# Patient Record
Sex: Female | Born: 1975
Health system: Southern US, Community
[De-identification: ages and names within clinical notes are randomized; demographics above are authoritative.]

## PROBLEM LIST (undated history)

## (undated) DIAGNOSIS — G43909 Migraine, unspecified, not intractable, without status migrainosus: Secondary | ICD-10-CM

## (undated) DIAGNOSIS — Z8659 Personal history of other mental and behavioral disorders: Secondary | ICD-10-CM

## (undated) DIAGNOSIS — F329 Major depressive disorder, single episode, unspecified: Secondary | ICD-10-CM

## (undated) DIAGNOSIS — F419 Anxiety disorder, unspecified: Secondary | ICD-10-CM

## (undated) DIAGNOSIS — F32A Depression, unspecified: Secondary | ICD-10-CM

## (undated) DIAGNOSIS — Z87442 Personal history of urinary calculi: Secondary | ICD-10-CM

## (undated) DIAGNOSIS — R112 Nausea with vomiting, unspecified: Secondary | ICD-10-CM

## (undated) DIAGNOSIS — Z9889 Other specified postprocedural states: Secondary | ICD-10-CM

## (undated) DIAGNOSIS — Z87898 Personal history of other specified conditions: Secondary | ICD-10-CM

## (undated) HISTORY — PX: ORIF RADIUS & ULNA FRACTURES: SHX2129

## (undated) HISTORY — PX: OTHER SURGICAL HISTORY: SHX169

## (undated) HISTORY — DX: Depression, unspecified: F32.A

## (undated) HISTORY — DX: Migraine, unspecified, not intractable, without status migrainosus: G43.909

## (undated) HISTORY — DX: Major depressive disorder, single episode, unspecified: F32.9

## (undated) HISTORY — PX: ELBOW SURGERY: SHX618

---

## 1999-12-09 ENCOUNTER — Other Ambulatory Visit: Admission: RE | Admit: 1999-12-09 | Discharge: 1999-12-09 | Payer: Self-pay | Admitting: Family Medicine

## 2002-12-23 ENCOUNTER — Encounter: Payer: Self-pay | Admitting: Cardiology

## 2003-06-13 ENCOUNTER — Emergency Department (HOSPITAL_COMMUNITY): Admission: EM | Admit: 2003-06-13 | Discharge: 2003-06-13 | Payer: Self-pay | Admitting: Emergency Medicine

## 2003-08-24 ENCOUNTER — Emergency Department (HOSPITAL_COMMUNITY): Admission: EM | Admit: 2003-08-24 | Discharge: 2003-08-24 | Payer: Self-pay | Admitting: Emergency Medicine

## 2004-10-15 ENCOUNTER — Emergency Department (HOSPITAL_COMMUNITY): Admission: EM | Admit: 2004-10-15 | Discharge: 2004-10-15 | Payer: Self-pay | Admitting: Family Medicine

## 2005-01-29 ENCOUNTER — Ambulatory Visit (HOSPITAL_COMMUNITY): Admission: RE | Admit: 2005-01-29 | Discharge: 2005-01-29 | Payer: Self-pay | Admitting: Obstetrics & Gynecology

## 2005-01-29 ENCOUNTER — Encounter: Payer: Self-pay | Admitting: Obstetrics & Gynecology

## 2005-09-25 ENCOUNTER — Ambulatory Visit: Payer: Self-pay | Admitting: Gastroenterology

## 2005-10-16 ENCOUNTER — Ambulatory Visit: Payer: Self-pay | Admitting: Gastroenterology

## 2005-10-16 ENCOUNTER — Ambulatory Visit (HOSPITAL_COMMUNITY): Admission: RE | Admit: 2005-10-16 | Discharge: 2005-10-16 | Payer: Self-pay | Admitting: Gastroenterology

## 2005-10-16 ENCOUNTER — Encounter (INDEPENDENT_AMBULATORY_CARE_PROVIDER_SITE_OTHER): Payer: Self-pay | Admitting: Specialist

## 2006-08-03 ENCOUNTER — Emergency Department (HOSPITAL_COMMUNITY): Admission: EM | Admit: 2006-08-03 | Discharge: 2006-08-04 | Payer: Self-pay | Admitting: Emergency Medicine

## 2006-10-31 ENCOUNTER — Inpatient Hospital Stay (HOSPITAL_COMMUNITY): Admission: EM | Admit: 2006-10-31 | Discharge: 2006-11-02 | Payer: Self-pay | Admitting: Emergency Medicine

## 2007-08-05 ENCOUNTER — Ambulatory Visit: Payer: Self-pay | Admitting: Gastroenterology

## 2009-02-17 DIAGNOSIS — Z87898 Personal history of other specified conditions: Secondary | ICD-10-CM

## 2009-02-17 HISTORY — DX: Personal history of other specified conditions: Z87.898

## 2009-02-17 HISTORY — PX: ULNAR NERVE TRANSPOSITION: SHX2595

## 2009-04-03 ENCOUNTER — Other Ambulatory Visit: Admission: RE | Admit: 2009-04-03 | Discharge: 2009-04-03 | Payer: Self-pay | Admitting: Obstetrics & Gynecology

## 2009-07-23 ENCOUNTER — Encounter: Admission: RE | Admit: 2009-07-23 | Discharge: 2009-10-21 | Payer: Self-pay | Admitting: Orthopedic Surgery

## 2009-10-13 ENCOUNTER — Encounter: Payer: Self-pay | Admitting: Cardiology

## 2009-10-26 DIAGNOSIS — R002 Palpitations: Secondary | ICD-10-CM | POA: Insufficient documentation

## 2009-10-26 DIAGNOSIS — F411 Generalized anxiety disorder: Secondary | ICD-10-CM | POA: Insufficient documentation

## 2009-10-26 DIAGNOSIS — G43909 Migraine, unspecified, not intractable, without status migrainosus: Secondary | ICD-10-CM | POA: Insufficient documentation

## 2009-10-29 ENCOUNTER — Ambulatory Visit: Payer: Self-pay | Admitting: Cardiology

## 2009-11-23 ENCOUNTER — Ambulatory Visit: Payer: Self-pay | Admitting: Cardiology

## 2009-11-24 ENCOUNTER — Encounter: Payer: Self-pay | Admitting: Cardiology

## 2010-03-19 NOTE — Assessment & Plan Note (Signed)
Summary: f55m - cardionet  --agh   Visit Type:  Follow-up Primary Provider:  Youlanda Roys  CC:  palpitations.  History of Present Illness: The patient presents for followup of palpitations. From our last appointment I checked chemistry and TSH and these were normal. She wanted vent monitor but unfortunately had none of palpitations while wearing this monitor. 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 She has had no presyncope or syncope. She has had no chest discomfort, neck or arm discomfort. She's had no new shortness of breath.  Preventive Screening-Counseling & Management  Alcohol-Tobacco     Smoking Status: never  Current Medications (verified): 1)  Relpax 40 Mg Tabs (Eletriptan Hydrobromide) .... Use As Directed Migraines 2)  Tylenol Extra Strength 500 Mg Tabs (Acetaminophen) .... As Needed 3)  Ibuprofen 200 Mg Tabs (Ibuprofen) .... As Needed  Allergies: 1)  ! Pcn 2)  ! Adhesive Tape  Comments:  Nurse/Medical Assistant: The patient's medications and allergies were verbally reviewed with the patient and were updated in the Medication and Allergy Lists.  Past History:  Past Medical History: Migraine headaches Depression  Palpitations  Past Surgical History: Reviewed history from 10/29/2009 and no changes required. Open treatment of left distal radius fracture with internal fixation on November 01, 2006.  Endometrial ablation  Review of Systems       As stated in the HPI and negative for all other systems.   Vital Signs:  Patient profile:   35 year old female Height:      67 inches Weight:      251 pounds Pulse rate:   74 / minute BP sitting:   122 / 82  (left arm) Cuff size:   large  Vitals Entered By: Carlye Grippe (November 23, 2009 10:58 AM)  Physical Exam  General:  Well developed, well nourished, in no acute distress. Head:  normocephalic and atraumatic Neck:  Neck supple, no JVD. No masses,  thyromegaly or abnormal cervical nodes. Chest Wall:  no deformities Lungs:  Clear bilaterally to auscultation and percussion. Heart:  Non-displaced PMI, chest non-tender; regular rate and rhythm, S1, S2 without murmurs, rubs or gallops. Carotid upstroke normal, no bruit. Normal abdominal aortic size, no bruits. Femorals normal pulses, no bruits. Pedals normal pulses. No edema, no varicosities. Abdomen:  Bowel sounds positive; abdomen soft and non-tender without masses, organomegaly, or hernias noted. No hepatosplenomegaly, obese Msk:  Back normal, normal gait. Muscle strength and tone normal. Extremities:  No clubbing or cyanosis. Neurologic:  Alert and oriented x 3. Cervical Nodes:  no significant adenopathy Psych:  Normal affect.   Impression & Recommendations:  Problem # 1:  PALPITATIONS (ICD-785.1) At this point I will give her a prescription for metoprolol 12.5 mg q.8 hours p.r.n. palpitations. We discussed a pill in the pocket approach to taking this. She'll call me if she has no improvement and she is having these spells.  Problem # 2:  OBESITY, UNSPECIFIED (ICD-278.00) We have discussed weight loss with exercise and diet.  Appended Document: [facility]     Current Medications (verified): 1)  Relpax 40 Mg Tabs (Eletriptan Hydrobromide) .... Use As Directed Migraines 2)  Tylenol Extra Strength 500 Mg Tabs (Acetaminophen) .... As Needed 3)  Ibuprofen 200 Mg Tabs (Ibuprofen) .... As Needed 4)  Metoprolol Tartrate 25 Mg Tabs (Metoprolol Tartrate) .... May Take 1/2 Tab (12.5mg ) Every 8 Hours As Needed Palpitations (May Take Up To 2-3 Per Day)  Allergies: 1)  !  Pcn 2)  ! Adhesive Tape   Patient Instructions: 1)  Metoprolol 12.5mg  every 8 hours as needed for palpitations, may have up to 2-3 per day 2)  Follow up as needed. Prescriptions: METOPROLOL TARTRATE 25 MG TABS (METOPROLOL TARTRATE) may take 1/2 tab (12.5mg ) every 8 hours as needed palpitations (may take up to  2-3 per day)  #30 x 3   Entered by:   Hoover Brunette, LPN   Authorized by:   Rollene Rotunda, MD, [facility]   Signed by:   Hoover Brunette, LPN on 04/54/0981   Method used:   Electronically to        [facility] (retail)       [address]       [place], [place]  [address]       Ph: [phone]       Fax: [phone]   RxID:   [id number]

## 2010-03-19 NOTE — Assessment & Plan Note (Signed)
Summary: NP-INTERMITTENT PALPS   Visit Type:  Initial Consult Primary Provider:  Youlanda Roys  CC:  palpitations.  History of Present Illness: The patient presents for evaluation of palpitations. She started having these in June of this year and they went on for several weeks. They then stopped through most of July and August but restarted in late August. She will feel this "out of body experience". She will feel her heart racing but when she takes her pulse is normal. She will feel very short of breath and start panting. She will feel lightheaded but has not had frank syncope. She gets diaphoretic but does not describe chest discomfort, neck or arm discomfort. She is not describing PND or orthopnea. She is not describing edema. She says these episodes are not provoked and she cannot bring them on. They may even wake her from her sleep. She has had no prior workup of this other than an EKG which is described below.  Preventive Screening-Counseling & Management  Alcohol-Tobacco     Smoking Status: never  Current Medications (verified): 1)  Relpax 20 Mg Tabs (Eletriptan Hydrobromide) .... As Needed 2)  Tylenol Extra Strength 500 Mg Tabs (Acetaminophen) .... As Needed 3)  Ibuprofen 200 Mg Tabs (Ibuprofen) .... As Needed  Allergies (verified): 1)  ! Pcn  Comments:  Nurse/Medical Assistant: The patient's medications and allergies were verbally reviewed with the patient and were updated in the Medication and Allergy Lists.  Past History:  Past Medical History: Migraine headaches Depression   Past Surgical History: Open treatment of left distal radius fracture with internal fixation on November 01, 2006.  Endometrial ablation  Family History: No cardiac history, , no cardiomyopathy, no palpitations, syncope or sudden cardiac death  Social History: Works in Set designer Never smoked Married two childrenSmoking Status:  never  Review of Systems       Positive for  migraines, occasional difficulty swallowing. Otherwise as stated in the history of present illness negative for all other systems.  Vital Signs:  Patient profile:   35 year old female Height:      67 inches Weight:      251 pounds BMI:     39.45 Pulse rate:   78 / minute BP sitting:   120 / 85  (left arm) Cuff size:   large  Vitals Entered By: Carlye Grippe (October 29, 2009 11:17 AM)  Nutrition Counseling: Patient's BMI is greater than 25 and therefore counseled on weight management options. CC: palpitations   Physical Exam  General:  Well developed, well nourished, in no acute distress. Head:  normocephalic and atraumatic Eyes:  PERRLA/EOM intact; conjunctiva and lids normal. Mouth:  Teeth, gums and palate normal. Oral mucosa normal. Neck:  Neck supple, no JVD. No masses, thyromegaly or abnormal cervical nodes. Chest Wall:  no deformities or breast masses noted Lungs:  Clear bilaterally to auscultation and percussion. Abdomen:  Bowel sounds positive; abdomen soft and non-tender without masses, organomegaly, or hernias noted. No hepatosplenomegaly, obese Msk:  Back normal, normal gait. Muscle strength and tone normal. Extremities:  No clubbing or cyanosis. Neurologic:  Alert and oriented x 3. Skin:  Intact without lesions or rashes. Cervical Nodes:  no significant adenopathy Inguinal Nodes:  no significant adenopathy Psych:  Normal affect.   Detailed Cardiovascular Exam  Neck    Carotids: Carotids full and equal bilaterally without bruits.      Neck Veins: Normal, no JVD.    Heart    Inspection: no deformities or lifts noted.  Palpation: normal PMI with no thrills palpable.      Auscultation: regular rate and rhythm, S1, S2 without murmurs, rubs, gallops, or clicks.    Vascular    Abdominal Aorta: no palpable masses, pulsations, or audible bruits.      Femoral Pulses: normal femoral pulses bilaterally.      Pedal Pulses: normal pedal pulses bilaterally.       Radial Pulses: normal radial pulses bilaterally.      Peripheral Circulation: no clubbing, cyanosis, or edema noted with normal capillary refill.     EKG  Procedure date:  10/13/2009  Findings:      Sinus rhythm, rate 69, axis within normal limits, intervals within normal limits, no acute ST-T wave changes  Impression & Recommendations:  Problem # 1:  PALPITATIONS (ICD-785.1)  The patient is having symptoms as described. I will start with a TSH and basic metabolic profile. I would check a 21 day event monitor. Further evaluation will be based on these results.  Orders: T-TSH [phone]) T-Basic Metabolic Panel [id number]) Cardionet/Event Monitor (Cardionet/Event)  Problem # 2:  OBESITY, UNSPECIFIED (ICD-278.00) I discussed with her the need for diet and exercise.  Problem # 3:  ANXIETY STATE, UNSPECIFIED (ICD-300.00) She reports that she's had some situational depression although she does not report any prior anxiety or panic. However, we discussed this as a possibility etiology though other etiologies certainly need to be excluded.  Patient Instructions: 1)  21 day Cardionet monitor 2)  Labs:  TSH, BMET 3)  Follow up after above.

## 2010-03-19 NOTE — Letter (Signed)
Summary: External Correspondence/ OFFICE VISIT [facility]  External Correspondence/ OFFICE VISIT [facility]   Imported By: Dorise Hiss 10/17/2009 15:10:34  _____________________________________________________________________  External Attachment:    Type:   Image     Comment:   External Document

## 2010-03-19 NOTE — Procedures (Signed)
Summary: Holter and Event/ CARD[facility]ET  Holter and Event/ CARDIONET   Imported By: Dorise Hiss 12/04/2009 11:56:55  _____________________________________________________________________  External Attachment:    Type:   Image     Comment:   External Document
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
## 2010-07-02 NOTE — Assessment & Plan Note (Signed)
NAME:  Barbara [name], Barbara [name]                CHART#:  [id number]   DATE:  08/05/2007                       DOB:  1975/08/07   REFERRING PROVIDERS:  Zollie Scale, PA with Donzetta Sprung, MD.   PROBLEM LIST:  1. Eosinophilic esophagitis.  2. Headaches/migraines.  3. Hypermenorrhea requiring endometrial ablation in 2006.   SUBJECTIVE:  The patient is a 36 year old female who was last seen in  August 2007.  She states she took the fluticasone until her inhaler was  gone.  She has had no additional problems with dysphagia.  She is  complaining of eating and feeling nauseous.  Ten to twenty minutes after  eating, she has to go to the bathroom.  She describes the consistency of  her stools baby poop to watery.  She has had no change in her diet or  her medications.  She denies any dysuria or blood in urine.  She has had  no fever or chills.  This type of symptoms have never happened before.  She has no other sick contacts.  She did not visit any nursing homes or  hospitals.  She has not had any antibiotics or travel.  She had no  problems swallowing.  She uses Aciphex 1-2 tablets a week.  She has no  change in her psychosocial stressors.  She has no pain with bowel  movements.  She denies any abdominal pain or weight loss.  She eats  little-to-no dairy products.  She uses 3 sticks of sugar-free gum a  day.  She eats baked chips.  She drinks 1 soda a day.  She has city  water, but her mother and father have well water.  Usually, the nausea  is relieved after bowel movement.   MEDICATIONS:  Aciphex as needed.   OBJECTIVE:  VITAL SIGNS:  Weight 249 pounds, height 5 feet 7 inches, BMI  39 (severely obese), temperature 99.1, blood pressure 112/88, and pulse  88.  GENERAL:  She is in no apparent distress.  Alert and orient x4.HEENT:  Atraumatic and normocephalic.  Pupils equally reactive to light.  Mouth,  no oral lesions.  Posterior pharynx without erythema or exudate.NECK:  Full range of motion.  No  lymphadenopathy.LUNGS:  Clear to auscultation  bilaterally.CARDIOVASCULAR:  Regular rhythm.  No murmur.  Normal S1 and  S2.ABDOMEN:  Bowel sounds are present.  Soft, nontender, and  nondistended.  No rebound or guarding.NEUROLOGIC:  She has no focal  neurologic deficits.SKIN:  She has no pretibial lesions.   ASSESSMENT:  The patient is a 35 year old female with a sudden onset of  nausea and diarrhea.  The differential diagnosis includes subacute  urinary tract infection, celiac sprue, thyroid disturbance, and a low  likelihood of Clostridium difficile colitis, giardiasis, or microscopic  colitis.  Thank you for allowing me to see the patient in consultation.  My recommendations are follow.   RECOMMENDATIONS:  1. Will check an UA, quantitative HCG, TTG IgA and quantitative      immunoglobulins.  She also needs a TSH.  We will obtain stool for      fecal lactoferrin, C. diff toxin and Giardia antigen.  2. She is asked to use Levsin sublingual 0.125 mg tablets 1-2      underneath the tongue 30 minutes prior to meals.  She may repeat  that every 4 hours.  3. Once the stool studies and the labs are back, we will decide if      additional diagnostic workup is needed.  4. She has a follow up appointment to see me in 6-8 weeks.       Kassie Mends, M.D.  Electronically Signed     SM/MEDQ  D:  08/05/2007  T:  08/06/2007  Job:  [id number]   cc:   Donzetta Sprung  Zollie Scale, PA
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
## 2010-07-05 NOTE — Op Note (Signed)
NAME:  Barbara [name], Barbara [name]               ACCOUNT NO.:  [id number]   MEDICAL RECORD NO.:  [id number]          PATIENT TYPE:  AMB   LOCATION:  DAY                           FACILITY:  [facility]   PHYSICIAN:  Kassie Mends, M.D.      DATE OF BIRTH:  1975-04-24   DATE OF PROCEDURE:  10/16/2005  DATE OF DISCHARGE:                                  PROCEDURE NOTE   REFERRING PHYSICIAN:  Donzetta Sprung.   PROCEDURE:  Esophagogastroduodenoscopy with cold forceps biopsies.   INDICATION FOR EXAM:  Ms. Bordner is a 35 year old female with solid  dysphagia and epigastric pain.   FINDINGS:  1. Corrugated and furrowed esophagus.  Biopsies obtained 20 cm from the      teeth and 35 cm from the teeth.  The EG junction was at 39 cm from the      teeth.  2. Multiple antral erosions.  Biopsies obtained for H. pylori.  3. Multiple duodenal ulcerations located in the bulb.  The second portion      of the duodenum was normal.   RECOMMENDATIONS:  1. Begin AcipHex 20 mg daily and continue for 3 months.  Avoid aspirin and      NSAIDs.  2. Adhere to a no gastric irritant diet.  3. Will follow up biopsies.  She will likely need inhaled steroids for      eosinophilic esophagitis.  Will treat for H. pylori if positive.  4. Follow up with Dr. Kassie Mends in 4 weeks.   MEDICATIONS:  1. Demerol 100 mg IV.  2. Versed 8 mg IV.  3. Phenergan 25 mg IV.   PROCEDURE TECHNIQUE:  Physical exam was performed and informed consent was  obtained from the patient after explaining all of the benefits, risks and  alternatives to the procedure, which the patient appeared understand and so  stated.  The patient was connected to the monitor and placed in the left  lateral position.  Continuous oxygen was provided by nasal cannula and IV  medicine administered through an indwelling cannula.  After administration  of sedation, the patient's esophagus was intubated and the scope was  advanced under direct visualization to the second  portion of the duodenum.  The scope was subsequently removed slowly by  carefully examine the color, texture, anatomy and integrity of the mucosa on  the way out.  The patient was recovered in the endoscopy suite and  discharged to home in satisfactory condition.      Kassie Mends, M.D.  Electronically Signed     SM/MEDQ  D:  10/16/2005  T:  10/17/2005  Job:  [id number]   cc:   Donzetta Sprung  Fax: [phone]

## 2010-07-05 NOTE — Consult Note (Signed)
NAME:  Barbara [name], Barbara [name]               ACCOUNT NO.:  [id number]   MEDICAL RECORD NO.:  [id number]          PATIENT TYPE:  AMB   LOCATION:                                FACILITY:  [facility]   PHYSICIAN:  Kassie Mends, M.D.      DATE OF BIRTH:  1975-08-28   DATE OF CONSULTATION:  DATE OF DISCHARGE:                                   CONSULTATION   NEW PATIENT VISIT:   REASON FOR VISIT:  Difficulty swallowing.   HISTORY OF PRESENT ILLNESS:  Ms. Longmire is a 35 year old female who feels  like all food gets stuck in her chest.  This happens three times a week.  She has never had an impacted food bolus.  Once a week she complains of  heartburn and indigestion.  She describes it as a burning sensation.  If she  burps she does have a bad taste in her mouth.  She has nausea maybe once a  week.  She denies any vomiting or weight loss.  She rarely has diarrhea and  has intermittent constipation.  She denies any blood in her stool, black  tarry stools and her appetite is okay.  She admits to recent stress.  Her  grandfather has been ill and had to go into a skilled nursing facility.  She  has developed some epigastric discomfort.  She does use Goody's less than  once a week.  She denies any Aleve or Motrin use.  The pain does not radiate  and it is not associated with the change in stool.   PAST MEDICAL HISTORY:  1. Headaches/migraines:  2. Hypermenorrhea.   PAST SURGICAL HISTORY:  Endometrial ablation.   ALLERGIES:  PENICILLIN.   MEDICATIONS:  None.   FAMILY HISTORY:  She has a family history of acid reflux, diabetes, and  hypertension.  She has a paternal grandfather who had colon cancer at age  1.  She denies any family history of polyps.  There is no history of  breast, uterine, or ovarian cancer.   SOCIAL HISTORY:  She is married and has two children.  She works for the  [employer].  She denies any tobacco or alcohol use.   REVIEW OF SYSTEMS:  Last menstrual period November 2006  otherwise all  systems are negative.   PHYSICAL EXAMINATION:  VITAL SIGNS:  Weight 244 pounds, height 5 feet 7  inches, body mass index 38.2 (obese).  Temperature 99.6, blood pressure  110/62, pulse 60.GENERAL:  She is in no apparent distress, alert and  oriented x4.HEENT EXAM:  Atraumatic, normocephalic.  Pupils equal and  reactive to light.  Mouth:  No oral lesions.  Posterior pharynx without  erythema or exudate.NECK:  Her neck has full range of motion and no  lymphadenopathy.LUNGS:  Her lungs are clear to auscultation.  Bilaterally.CARDIOVASCULAR EXAM:  Regular rhythm, no murmur, normal S1 and  S2.ABDOMEN:  Bowel sounds are present, soft, nontender, nondistended, no  rebound, no guarding, no hepatosplenomegaly.EXTREMITIES:  Her extremities  are without cyanosis, clubbing, or edema.NEURO:  She has no focal neurologic  deficits.   ASSESSMENT:  Ms. Luckman is a 35 year old female with epigastric pain and  solid food dysphagia.  The differential diagnosis includes reflux  esophagitis causing secondary esophageal motility disorder, or esophageal  ring or stricture, or peptic stricture.  Her epigastric pain is likely  gastritis.   PLAN:  We will schedule upper endoscopy and biopsy the proximal and distal  esophagus to rule out eosinophilic esophagitis.  I will check H. pylori  serology today.  I would consider manometry if the EGD is negative.  She  will be scheduled for an outpatient visit in 1 month.  Tylenol is okay to  use for pain.  She is asked to not use Goody's or BC's, Aleve or other anti-  inflammatory drugs.      Kassie Mends, M.D.  Electronically Signed     SM/MEDQ  D:  09/29/2005  T:  09/29/2005  Job:  [id number]

## 2010-07-05 NOTE — Op Note (Signed)
NAME:  Barbara [name], Barbara [name]               ACCOUNT NO.:  [id number]   MEDICAL RECORD NO.:  [id number]          PATIENT TYPE:  AMB   LOCATION:  DAY                           FACILITY:  [facility]   PHYSICIAN:  Lazaro Arms, M.D.   DATE OF BIRTH:  11/08/1975   DATE OF PROCEDURE:  01/29/2005  DATE OF DISCHARGE:                                 OPERATIVE REPORT   PREOPERATIVE DIAGNOSES:  1.  Menometrorrhagia.  2.  Dysmenorrhea.   POSTOPERATIVE DIAGNOSES:  1.  Menometrorrhagia.  2.  Dysmenorrhea.   PROCEDURE:  Hysteroscopy D&C, endometrial ablation.   SURGEON:  Lazaro Arms, M.D.   ANESTHESIA:  Laryngeal mask airway.   FINDINGS:  The patient had a smooth endometrial cavity. There were no  polyps, no fibroids, no distortion. Endometrial curettings appeared to be  normal.   DESCRIPTION OF OPERATION:  The patient was taken to the operating room,  placed in the supine position where she underwent laryngeal mask airway  anesthesia. She was placed in dorsal lithotomy position, prepped and draped  in the usual sterile fashion. A Grave's speculum was placed. The cervix was  grasped. Paracervical block using half percent Marcaine with 1:200,000  epinephrine was placed without difficulty. The cervix was dilated serially  to allow passage of the hysteroscopy. Hysteroscopy was performed. The above  noted findings were seen. A vigorous uterine curettage was performed with  normal curettings returned in a moderate amount. A ThermaChoice endometrial  ablation was then performed, and it required 34 cc of D5W to maintain a  pressure of approximately 190 mmHg. He was heated 87 to 88 degrees of  Celsius. Total therapy was 10 minutes and 47 seconds. All of the fluid was  returned at the end of the procedure. The patient tolerated the procedure  well. She experienced minimal blood loss, taken to recovery room in good and  stable condition. All counts were correct x3. She received Ancef and Toradol  prophylactically.      Lazaro Arms, M.D.  Electronically Signed    LHE/MEDQ  D:  01/29/2005  T:  01/29/2005  Job:  [id number]

## 2010-08-08 ENCOUNTER — Other Ambulatory Visit: Payer: Self-pay | Admitting: Family Medicine

## 2010-08-08 ENCOUNTER — Ambulatory Visit
Admission: RE | Admit: 2010-08-08 | Discharge: 2010-08-08 | Disposition: A | Discharge status: Home / Self Care | Payer: Self-pay | Source: Ambulatory Visit | Attending: Family Medicine | Admitting: Family Medicine

## 2010-08-08 DIAGNOSIS — M79643 Pain in unspecified hand: Secondary | ICD-10-CM

## 2010-08-08 DIAGNOSIS — M7989 Other specified soft tissue disorders: Secondary | ICD-10-CM

## 2010-11-29 LAB — PROTIME-INR: Prothrombin Time: 13.7

## 2010-11-29 LAB — BASIC METABOLIC PANEL
BUN: 7
Chloride: 104
GFR calc Af Amer: >60
Glucose, Bld: 118 — ABNORMAL HIGH

## 2010-11-29 LAB — DIFFERENTIAL
Basophils Absolute: 0.1
Eosinophils Absolute: 0
Monocytes Relative: 3

## 2010-11-29 LAB — CBC
Hemoglobin: 15.2 — ABNORMAL HIGH
MCV: 90.5
RBC: 4.89
WBC: 19.9 — ABNORMAL HIGH

## 2010-12-07 ENCOUNTER — Encounter: Payer: Self-pay | Admitting: Emergency Medicine

## 2010-12-07 ENCOUNTER — Inpatient Hospital Stay (INDEPENDENT_AMBULATORY_CARE_PROVIDER_SITE_OTHER)
Admission: RE | Admit: 2010-12-07 | Discharge: 2010-12-07 | Disposition: A | Discharge status: Home / Self Care | Payer: PRIVATE HEALTH INSURANCE | Source: Ambulatory Visit | Attending: Emergency Medicine | Admitting: Emergency Medicine

## 2010-12-07 DIAGNOSIS — N390 Urinary tract infection, site not specified: Secondary | ICD-10-CM

## 2010-12-07 LAB — CONVERTED CEMR LAB
Ketones, urine, test strip: NEGATIVE
Specific Gravity, Urine: 1.01
Urobilinogen, UA: 0.2

## 2010-12-10 ENCOUNTER — Telehealth (INDEPENDENT_AMBULATORY_CARE_PROVIDER_SITE_OTHER): Payer: Self-pay | Admitting: *Deleted

## 2011-01-20 NOTE — Telephone Encounter (Signed)
  Phone Note Outgoing Call Call back at Home Phone [phone] P [facility]     Call placed by: Lajean Saver RN,  December 10, 2010 3:51 PM Call placed to: Patient Action Taken: Phone Call Completed Summary of Call: CAllback: Patient reports her symptoms are improving. She c/o mild side affects from the antibiotic.  Urine culture result given and encourage to finish antibiotic. She is finishing the antibiotic tomorrow.

## 2011-01-20 NOTE — Progress Notes (Signed)
Summary: UTI(rm5)   Vital Signs:  Patient Profile:   35 Years Old Barbara Barbara:      UTI Height:     67 inches Weight:      262 pounds O2 Sat:      99 % O2 treatment:    Room Air Temp:     98.0 degrees F oral Pulse rate:   110 / minute Resp:     20 per minute BP sitting:   129 / 83  (left arm) Cuff size:   large  Vitals Entered By: Linton Flemings RN (December 07, 2010 4:10 PM)                  Current Allergies (reviewed today): ! PCN ! ADHESIVE TAPEHistory of Present Illness Chief Complaint: UTI History of Present Illness: 35 Years Old Barbara complains of UTI symptoms for 2 days.  She describes the pain as burning during urination.  She has not used any OTC meds. + dysuria No frequency No urgency + hematuria No vaginal discharge + fever/chills + nausea No lower abdomenal pain No back pain No fatigue   REVIEW OF SYSTEMS Constitutional Symptoms       Complains of fever.     Denies chills, night sweats, weight loss, weight gain, and fatigue.  Eyes       Denies change in vision, eye pain, eye discharge, glasses, contact lenses, and eye surgery. Ear/Nose/Throat/Mouth       Denies hearing loss/aids, change in hearing, ear pain, ear discharge, dizziness, frequent runny nose, frequent nose bleeds, sinus problems, sore throat, hoarseness, and tooth pain or bleeding.  Respiratory       Denies dry cough, productive cough, wheezing, shortness of breath, asthma, bronchitis, and emphysema/COPD.  Cardiovascular       Denies murmurs, chest pain, and tires easily with exhertion.    Gastrointestinal       Complains of nausea/vomiting.      Denies stomach pain, diarrhea, constipation, blood in bowel movements, and indigestion. Genitourniary       Complains of painful urination and blood or discharge from vagina.      Denies kidney stones and loss of urinary control. Neurological       Denies paralysis, seizures, and fainting/blackouts. Musculoskeletal       Denies muscle pain,  joint pain, joint stiffness, decreased range of motion, redness, swelling, muscle weakness, and gout.  Skin       Denies bruising, unusual mles/lumps or sores, and hair/skin or nail changes.  Psych       Denies mood changes, temper/anger issues, anxiety/stress, speech problems, depression, and sleep problems. Other Comments: started this am   Past History:  Past Medical History: Reviewed history from 11/23/2009 and no changes required. Migraine headaches Depression  Palpitations  Past Surgical History: Reviewed history from 10/29/2009 and no changes required. Open treatment of left distal radius fracture with internal fixation on November 01, 2006.  Endometrial ablation  Family History: No cardiac history, , no cardiomyopathy, no palpitations, syncope or sudden cardiac death dad- DM, HTN Physical Exam General appearance: well developed, well nourished, no acute distress Abdomen: soft, non-tender without obvious organomegaly Back: No flank or CVA tenderness MSE: oriented to time, place, and person Assessment New Problems: URINARY TRACT INFECTION (ICD-599.0)   Patient Education: Patient and/or caregiver instructed in the following: rest, fluids.  Plan New Medications/Changes: CIPROFLOXACIN HCL 500 MG TABS (CIPROFLOXACIN HCL) 1 by mouth two times a day for 5 days  #10 x 0,  12/07/2010, Hoyt Koch MD  New Orders: Est. Patient Level II [phone] UA Dipstick w/o Micro (automated)  [81003] T-Culture, Urine [[id number]] Planning Comments:   Urine culture pending Follow-up with your primary care physician if not improving or if getting worse   The patient and/or caregiver has been counseled thoroughly with regard to medications prescribed including dosage, schedule, interactions, rationale for use, and possible side effects and they verbalize understanding.  Diagnoses and expected course of recovery discussed and will return if not improved as expected or if the  condition worsens. Patient and/or caregiver verbalized understanding.  Prescriptions: CIPROFLOXACIN HCL 500 MG TABS (CIPROFLOXACIN HCL) 1 by mouth two times a day for 5 days  #10 x 0   Entered and Authorized by:   Hoyt Koch MD   Signed by:   Hoyt Koch MD on 12/07/2010   Method used:   Print then Give to Patient   RxID:   [id number]   Orders Added: 1)  Est. Patient Level II [44034] 2)  UA Dipstick w/o Micro (automated)  [81003] 3)  T-Culture, Urine [[id number]]    Laboratory Results   Urine Tests  Date/Time Received: December 07, 2010 4:12 PM  Date/Time Reported: December 07, 2010 4:12 PM   Routine Urinalysis   Color: yellow Appearance: Cloudy Glucose: negative   (Normal Range: Negative) Bilirubin: negative   (Normal Range: Negative) Ketone: negative   (Normal Range: Negative) Spec. Gravity: 1.010   (Normal Range: 1.003-1.035) Blood: 3+   (Normal Range: Negative) pH: 6.0   (Normal Range: 5.0-8.0) Protein: 1+   (Normal Range: Negative) Urobilinogen: 0.2   (Normal Range: 0-1) Nitrite: negative   (Normal Range: Negative) Leukocyte Esterace: 3+   (Normal Range: Negative)

## 2011-09-16 ENCOUNTER — Encounter: Payer: Self-pay | Admitting: Cardiology

## 2012-07-23 ENCOUNTER — Emergency Department (INDEPENDENT_AMBULATORY_CARE_PROVIDER_SITE_OTHER): Payer: 59

## 2012-07-23 ENCOUNTER — Emergency Department (INDEPENDENT_AMBULATORY_CARE_PROVIDER_SITE_OTHER)
Admission: EM | Admit: 2012-07-23 | Discharge: 2012-07-24 | Disposition: A | Discharge status: Home / Self Care | Payer: Self-pay | Source: Home / Self Care | Attending: Family Medicine | Admitting: Family Medicine

## 2012-07-23 ENCOUNTER — Encounter: Payer: Self-pay | Admitting: *Deleted

## 2012-07-23 DIAGNOSIS — S62639A Displaced fracture of distal phalanx of unspecified finger, initial encounter for closed fracture: Secondary | ICD-10-CM

## 2012-07-23 DIAGNOSIS — S62609A Fracture of unspecified phalanx of unspecified finger, initial encounter for closed fracture: Secondary | ICD-10-CM

## 2012-07-23 DIAGNOSIS — S6000XA Contusion of unspecified finger without damage to nail, initial encounter: Secondary | ICD-10-CM

## 2012-07-23 DIAGNOSIS — W230XXA Caught, crushed, jammed, or pinched between moving objects, initial encounter: Secondary | ICD-10-CM

## 2012-07-23 DIAGNOSIS — S60042A Contusion of left ring finger without damage to nail, initial encounter: Secondary | ICD-10-CM

## 2012-07-23 HISTORY — DX: Anxiety disorder, unspecified: F41.9

## 2012-07-23 NOTE — ED Provider Notes (Signed)
History     CSN: [id number]  Arrival date & time 07/23/12  2002   First MD Initiated Contact with Patient 07/23/12 2027      Chief complaint:  Injury to left ring finger today      HPI Comments: Patient states that her left 4th fingertip was caught between metal spokes of a machine today.  She was able to remove her finger without difficulty.  She has had pain and swelling in her fingertip  Patient is a 37 y.o. female presenting with hand pain. The history is provided by the patient.  Hand Pain This is a new problem. The current episode started 6 to 12 hours ago. The problem occurs constantly. The problem has not changed since onset.Associated symptoms comments: none. Exacerbated by: touching fingertip. Nothing relieves the symptoms. Treatments tried: ice. The treatment provided no relief.    Past Medical History  Diagnosis Date  . Migraine headache   . Depression   . Palpitations   . Panic attacks   . Anxiety     Past Surgical History  Procedure Laterality Date  . Fracture surgery      left distal radius with internal fixation 11/01/2006  . Endometrial ablation      Family History  Problem Relation Age of Onset  . Hypertension Father   . Hyperlipidemia Father   . Diabetes Father     History  Substance Use Topics  . Smoking status: Never Smoker   . Smokeless tobacco: Not on file  . Alcohol Use: Not on file    OB History   Grav Para Term Preterm Abortions TAB SAB Ect Mult Living                  Review of Systems  All other systems reviewed and are negative.    Allergies  Latex and Penicillins  Home Medications   Current Outpatient Rx  Name  Route  Sig  Dispense  Refill  . ALPRAZolam (XANAX) 0.25 MG tablet   Oral   Take 0.25 mg by mouth at bedtime as needed for sleep.         . citalopram (CELEXA) 10 MG tablet   Oral   Take 10 mg by mouth daily.         Marland Kitchen eletriptan (RELPAX) 40 MG tablet   Oral   One tablet by mouth at onset of headache.  May repeat in 2 hours if headache persists or recurs. may repeat in 2 hours if necessary         . promethazine (PHENERGAN) 25 MG tablet   Oral   Take 25 mg by mouth every 6 (six) hours as needed for nausea.         . metoprolol tartrate (LOPRESSOR) 25 MG tablet   Oral   Take 12.5 mg by mouth as needed.           BP 132/83  Pulse 75  Temp(Src) 98.2 F (36.8 C) (Oral)  Resp 18  Ht 5\' 7"  (1.702 m)  Wt 250 lb (113.399 kg)  BMI 39.15 kg/m2  SpO2 96%  Physical Exam  Nursing note and vitals reviewed. Constitutional: She is oriented to person, place, and time. She appears well-developed and well-nourished. No distress.  HENT:  Head: Normocephalic.  Eyes: Conjunctivae are normal. Pupils are equal, round, and reactive to light.  Musculoskeletal: Normal range of motion. She exhibits tenderness.       Left hand: She exhibits tenderness and swelling. She exhibits normal  range of motion, no bony tenderness, normal two-point discrimination, normal capillary refill, no deformity and no laceration. Normal sensation noted.       Hands: Left 4th fingertip has mild swelling and ecchymosis with tenderness to palpation.  Normal extension/flexion at DIP joint.  Distal neurovascular function is intact.  No sub-ungual hematoma.  Neurological: She is alert and oriented to person, place, and time.  Skin: Skin is warm and dry.    ED Course  Procedures  none   Dg Finger Ring Left  07/23/2012   *RADIOLOGY REPORT*  Clinical Data: Injury to the left ring finger.  LEFT RING FINGER 2+V  Comparison: No priors.  Findings: There is a very subtle nondisplaced fracture of the tuft of the fourth distal phalanx.  Overlying soft tissues are swollen. No other acute displaced fracture, subluxation or dislocation is noted.  IMPRESSION: 1.  Subtle nondisplaced fracture of the tuft of the fourth distal phalanx of the left hand.   Original Report Authenticated By: Trudie Reed, M.D.     1. Contusion of fourth  finger of left hand, initial encounter   2. Fracture of phalanx of hand, closed, initial encounter       MDM  Finger splint applied. Continue to apply ice pack for 10 minutes, 3 to 4 times daily.  Elevate hand.  May take Ibuprofen 200mg , 4 tabs every 8 hours with food.  Wear finger splint. Return for followup in one week.        Lattie Haw, MD 07/23/12 2120

## 2012-07-23 NOTE — ED Notes (Signed)
Pt c/o LT 4th digit injury x 0130 today. She took IBF 400mg  @ 3pm today. "I was emptying a scrap coiler and when the 'spokes' of it came down and overlapped catching my finger between them."

## 2012-12-02 ENCOUNTER — Ambulatory Visit (INDEPENDENT_AMBULATORY_CARE_PROVIDER_SITE_OTHER): Payer: Self-pay

## 2012-12-02 ENCOUNTER — Other Ambulatory Visit: Payer: Self-pay | Admitting: Emergency Medicine

## 2012-12-02 DIAGNOSIS — R52 Pain, unspecified: Secondary | ICD-10-CM

## 2012-12-02 DIAGNOSIS — M79609 Pain in unspecified limb: Secondary | ICD-10-CM

## 2012-12-22 ENCOUNTER — Encounter: Payer: Self-pay | Admitting: Adult Health

## 2012-12-22 ENCOUNTER — Other Ambulatory Visit (HOSPITAL_COMMUNITY)
Admission: RE | Admit: 2012-12-22 | Discharge: 2012-12-22 | Disposition: A | Discharge status: Home / Self Care | Payer: 59 | Source: Ambulatory Visit | Attending: Adult Health | Admitting: Adult Health

## 2012-12-22 ENCOUNTER — Ambulatory Visit (INDEPENDENT_AMBULATORY_CARE_PROVIDER_SITE_OTHER): Payer: 59 | Admitting: Adult Health

## 2012-12-22 VITALS — BP 130/80 | HR 78 | Ht 66.25 in | Wt 253.0 lb

## 2012-12-22 DIAGNOSIS — Z01419 Encounter for gynecological examination (general) (routine) without abnormal findings: Secondary | ICD-10-CM | POA: Insufficient documentation

## 2012-12-22 DIAGNOSIS — N949 Unspecified condition associated with female genital organs and menstrual cycle: Secondary | ICD-10-CM | POA: Insufficient documentation

## 2012-12-22 DIAGNOSIS — Z1151 Encounter for screening for human papillomavirus (HPV): Secondary | ICD-10-CM | POA: Insufficient documentation

## 2012-12-22 NOTE — Progress Notes (Signed)
Patient ID: Barbara [name], female   DOB: Nov 29, 1975, 37 y.o.   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   Current Medications, Allergies, Past Medical History, Past Surgical History, Family History and Social History were reviewed in [employer] record.   Past Medical History  Diagnosis Date  . Migraine headache   . Depression   . Palpitations   . Panic attacks   . Anxiety   . Unspecified symptom associated with female genital organs 12/22/2012   Past Surgical History  Procedure Laterality Date  . Fracture surgery      left distal radius with internal fixation 11/01/2006  . Endometrial ablation    . Elbow surgery Left   Current outpatient prescriptions:ALPRAZolam (XANAX) 0.25 MG tablet, Take 0.25 mg by mouth at bedtime as needed for sleep., Disp: , Rfl: ;  eletriptan (RELPAX) 40 MG tablet, One tablet by mouth at onset of headache. May repeat in 2 hours if headache persists or recurs. may repeat in 2 hours if necessary, Disp: , Rfl: ;  escitalopram (LEXAPRO) 5 MG tablet, Take 5 mg by mouth daily. , Disp: , Rfl:  Probiotic Product (PROBIOTIC DAILY PO), Take 1 tablet by mouth daily., Disp: , Rfl: ;  promethazine (PHENERGAN) 25 MG tablet, Take 25 mg by mouth every 6 (six) hours as needed for nausea., Disp: , Rfl:   Review of Systems: Patient denies any  blurred vision, shortness of breath, chest pain, problems with bowel movements, urination, or intercourse. She has migraines and is complaining of low pelvic pain.Has splint on 3rd finger from work injury.No mood changes takes meds.    Physical Exam:BP 130/80  Pulse 78  Ht 5' 6.25" (1.683 m)  Wt 253 lb (114.76 kg)  BMI 40.52 kg/m2 General:  Well developed, well nourished, no acute distress Skin:  Warm and dry Neck:  Midline  trachea, normal thyroid Lungs; Clear to auscultation bilaterally Breast:  No dominant palpable mass, retraction, or nipple discharge Cardiovascular: Regular rate and rhythm Abdomen:  Soft, non tender, no hepatosplenomegaly Pelvic:  External genitalia is normal in appearance.  The vagina is normal in appearance.  The cervix is bulbous.Pap with HPV performed.  Uterus is felt to be normal size, shape, and contour. Tender over uterus No  adnexal masses or tenderness noted.Has some point tenderness low back. Extremities:  No swelling or varicosities noted Psych:  Alert and cooperative   Impression: Yearly gyn exam Pelvic pain    Plan: Return in 1 week for [place] and see me Physical in 1 year Mammogram at 38 [facility] at [facility]

## 2012-12-22 NOTE — Patient Instructions (Signed)
Pelvic Pain Pelvic pain is pain below the belly button and located between your hips. Acute pain may last a few hours or days. Chronic pelvic pain may last weeks and months. The cause may be different for different types of pain. The pain may be dull or sharp, mild or severe and can interfere with your daily activities. Write down and tell your caregiver:   Exactly where the pain is located.  If it comes and goes or is there all the time.  When it happens (with sex, urination, bowel movement, etc.)  If the pain is related to your menstrual period or stress. Your caregiver will take a full history and do a complete physical exam and Pap test. CAUSES   Painful menstrual periods (dysmenorrhea).  Normal ovulation (Mittelschmertz) that occurs in the middle of the menstrual cycle every month.  The pelvic organs get engorged with blood just before the menstrual period (pelvic congestive syndrome).  Scar tissue from an infection or past surgery (pelvic adhesions).  Cancer of the female pelvic organs. When there is pain with cancer, it has been there for a long time.  The lining of the uterus (endometrium) abnormally grows in places like the pelvis and on the pelvic organs (endometriosis).  A form of endometriosis with the lining of the uterus present inside of the muscle tissue of the uterus (adenomyosis).  Fibroid tumor (noncancerous) in the uterus.  Bladder problems such as infection, bladder spasms of the muscle tissue of the bladder.  Intestinal problems (irritable bowel syndrome, colitis, an ulcer or gastrointestinal infection).  Polyps of the cervix or uterus.  Pregnancy in the tube (ectopic pregnancy).  The opening of the cervix is too small for the menstrual blood to flow through it (cervical stenosis).  Physical or sexual abuse (past or present).  Musculo-skeletal problems from poor posture, problems with the vertebrae of the lower back or the uterine pelvic muscles falling  (prolapse).  Psychological problems such as depression or stress.  IUD (intrauterine device) in the uterus. DIAGNOSIS  Tests to make a diagnosis depends on the type, location, severity and what causes the pain to occur. Tests that may be needed include:  Blood tests.  Urine tests  Ultrasound.  X-rays.  CT Scan.  MRI.  Laparoscopy.  Major surgery. TREATMENT  Treatment will depend on the cause of the pain, which includes:  Prescription or over-the-counter pain medication.  Antibiotics.  Birth control pills.  Hormone treatment.  Nerve blocking injections.  Physical therapy.  Antidepressants.  Counseling with a psychiatrist or psychologist.  Minor or major surgery. HOME CARE INSTRUCTIONS   Only take over-the-counter or prescription medicines for pain, discomfort or fever as directed by your caregiver.  Follow your caregiver's advice to treat your pain.  Rest.  Avoid sexual intercourse if it causes the pain.  Apply warm or cold compresses (which ever works best) to the pain area.  Do relaxation exercises such as yoga or meditation.  Try acupuncture.  Avoid stressful situations.  Try group therapy.  If the pain is because of a stomach/intestinal upset, drink clear liquids, eat a bland light food diet until the symptoms go away. SEEK MEDICAL CARE IF:   You need stronger prescription pain medication.  You develop pain with sexual intercourse.  You have pain with urination.  You develop a temperature of 102 F (38.9 C) with the pain.  You are still in pain after 4 hours of taking prescription medication for the pain.  You need depression medication.    Your IUD is causing pain and you want it removed. SEEK IMMEDIATE MEDICAL CARE IF:  You develop very severe pain or tenderness.  You faint, have chills, severe weakness or dehydration.  You develop heavy vaginal bleeding or passing solid tissue.  You develop a temperature of 102 F (38.9 C)  with the pain.  You have blood in the urine.  You are being physically or sexually abused.  You have uncontrolled vomiting and diarrhea.  You are depressed and afraid of harming yourself or someone else. Document Released: 03/13/2004 Document Revised: 04/28/2011 Document Reviewed: 12/09/2007 Exit[employer] Patient Information 2013 [employer]. Follow up in 1 week for [place] 

## 2012-12-29 ENCOUNTER — Ambulatory Visit (INDEPENDENT_AMBULATORY_CARE_PROVIDER_SITE_OTHER): Payer: 59 | Admitting: Adult Health

## 2012-12-29 ENCOUNTER — Ambulatory Visit (INDEPENDENT_AMBULATORY_CARE_PROVIDER_SITE_OTHER): Payer: 59

## 2012-12-29 ENCOUNTER — Other Ambulatory Visit: Payer: Self-pay | Admitting: Adult Health

## 2012-12-29 ENCOUNTER — Encounter: Payer: Self-pay | Admitting: Adult Health

## 2012-12-29 VITALS — BP 120/80 | Ht 66.25 in | Wt 204.0 lb

## 2012-12-29 DIAGNOSIS — N912 Amenorrhea, unspecified: Secondary | ICD-10-CM

## 2012-12-29 DIAGNOSIS — N949 Unspecified condition associated with female genital organs and menstrual cycle: Secondary | ICD-10-CM

## 2012-12-29 MED ORDER — DOXYCYCLINE HYCLATE 100 MG PO TABS: 100.0000 mg | ORAL_TABLET | Freq: Two times a day (BID) | ORAL | Status: DC

## 2012-12-29 NOTE — Patient Instructions (Signed)
Take doxycycline  Call prn

## 2012-12-29 NOTE — Progress Notes (Signed)
Subjective:     Patient ID: Barbara [name], female   DOB: 16-May-1975, 37 y.o.   MRN: [id number]  HPI Barbara [name] is a 37 year old white female back today for [place].She has had pelvic pain recently, she is sp ablation and had some black blood several days ago.On [place] was tender to the right with probe contact.  Review of Systems See HPI Reviewed past medical,surgical, social and family history. Reviewed medications and allergies.     Objective:   Physical Exam BP 120/80  Ht 5' 6.25" (1.683 m)  Wt 204 lb (92.534 kg)  BMI 32.67 kg/m2   Reviewed [place] with pt, will try doxycyline to see if pain and bleeding stops  Assessment:     Pelvic pain sp ablation    Plan:     Rx doxycycline 100 mg 1 bid x 10 days Follow up prn or if persists

## 2013-01-10 ENCOUNTER — Telehealth: Payer: Self-pay | Admitting: Adult Health

## 2013-01-10 NOTE — Telephone Encounter (Signed)
Spoke with pt. Pap was negative. JSY

## 2013-12-19 ENCOUNTER — Encounter: Payer: Self-pay | Admitting: Adult Health

## 2014-04-16 ENCOUNTER — Encounter (HOSPITAL_COMMUNITY): Payer: Self-pay | Admitting: Emergency Medicine

## 2014-04-16 ENCOUNTER — Emergency Department (HOSPITAL_COMMUNITY): Payer: 59

## 2014-04-16 ENCOUNTER — Emergency Department (HOSPITAL_COMMUNITY)
Admission: EM | Admit: 2014-04-16 | Discharge: 2014-04-16 | Disposition: A | Discharge status: Home / Self Care | Payer: 59 | Attending: Emergency Medicine | Admitting: Emergency Medicine

## 2014-04-16 DIAGNOSIS — Z91040 Latex allergy status: Secondary | ICD-10-CM | POA: Insufficient documentation

## 2014-04-16 DIAGNOSIS — Z79899 Other long term (current) drug therapy: Secondary | ICD-10-CM | POA: Diagnosis not present

## 2014-04-16 DIAGNOSIS — R109 Unspecified abdominal pain: Secondary | ICD-10-CM

## 2014-04-16 DIAGNOSIS — R1013 Epigastric pain: Secondary | ICD-10-CM | POA: Insufficient documentation

## 2014-04-16 DIAGNOSIS — F410 Panic disorder [episodic paroxysmal anxiety] without agoraphobia: Secondary | ICD-10-CM | POA: Insufficient documentation

## 2014-04-16 DIAGNOSIS — Z87448 Personal history of other diseases of urinary system: Secondary | ICD-10-CM | POA: Insufficient documentation

## 2014-04-16 DIAGNOSIS — Z880 Allergy status to penicillin: Secondary | ICD-10-CM | POA: Insufficient documentation

## 2014-04-16 DIAGNOSIS — Z792 Long term (current) use of antibiotics: Secondary | ICD-10-CM | POA: Insufficient documentation

## 2014-04-16 DIAGNOSIS — G43909 Migraine, unspecified, not intractable, without status migrainosus: Secondary | ICD-10-CM | POA: Insufficient documentation

## 2014-04-16 DIAGNOSIS — Z9889 Other specified postprocedural states: Secondary | ICD-10-CM | POA: Insufficient documentation

## 2014-04-16 DIAGNOSIS — F329 Major depressive disorder, single episode, unspecified: Secondary | ICD-10-CM | POA: Insufficient documentation

## 2014-04-16 LAB — CBC
HEMATOCRIT: 43.1 % (ref 36.0–46.0)
Hemoglobin: 14.7 g/dL (ref 12.0–15.0)
MCH: 30.1 pg (ref 26.0–34.0)
MCHC: 34.1 g/dL (ref 30.0–36.0)
MCV: 88.3 fL (ref 78.0–100.0)
PLATELETS: 388 10*3/uL (ref 150–400)
RBC: 4.88 MIL/uL (ref 3.87–5.11)
RDW: 13.1 % (ref 11.5–15.5)
WBC: 10 10*3/uL (ref 4.0–10.5)

## 2014-04-16 LAB — COMPREHENSIVE METABOLIC PANEL
ALK PHOS: 57 U/L (ref 39–117)
ALT: 17 U/L (ref 0–35)
ANION GAP: 6 (ref 5–15)
AST: 21 U/L (ref 0–37)
Albumin: 4 g/dL (ref 3.5–5.2)
BUN: 7 mg/dL (ref 6–23)
CALCIUM: 8.5 mg/dL (ref 8.4–10.5)
CO2: 25 mmol/L (ref 19–32)
Chloride: 105 mmol/L (ref 96–112)
Creatinine, Ser: 0.82 mg/dL (ref 0.50–1.10)
GFR calc Af Amer: >90 mL/min (ref >90)
GFR calc non Af Amer: 88 mL/min — ABNORMAL LOW (ref >90)
GLUCOSE: 92 mg/dL (ref 70–99)
Potassium: 3.9 mmol/L (ref 3.5–5.1)
Sodium: 136 mmol/L (ref 135–145)
Total Bilirubin: 0.8 mg/dL (ref 0.3–1.2)
Total Protein: 7.1 g/dL (ref 6.0–8.3)

## 2014-04-16 LAB — URINALYSIS, ROUTINE W REFLEX MICROSCOPIC
Bilirubin Urine: NEGATIVE
GLUCOSE, UA: NEGATIVE mg/dL
Hgb urine dipstick: NEGATIVE
Ketones, ur: NEGATIVE mg/dL
LEUKOCYTES UA: NEGATIVE
NITRITE: NEGATIVE
PROTEIN: NEGATIVE mg/dL
Specific Gravity, Urine: 1.015 (ref 1.005–1.030)
UROBILINOGEN UA: 0.2 mg/dL (ref 0.0–1.0)
pH: 7 (ref 5.0–8.0)

## 2014-04-16 LAB — PREGNANCY, URINE: PREG TEST UR: NEGATIVE

## 2014-04-16 LAB — LIPASE, BLOOD: Lipase: 20 U/L (ref 11–59)

## 2014-04-16 MED ORDER — IOHEXOL 300 MG/ML  SOLN
100.0000 mL | Freq: Once | INTRAMUSCULAR | Status: AC | PRN
  Administered 2014-04-16: 100 mL via INTRAVENOUS

## 2014-04-16 MED ORDER — ONDANSETRON HCL 4 MG/2ML IJ SOLN
4.0000 mg | Freq: Once | INTRAMUSCULAR | Status: AC
  Administered 2014-04-16: 4 mg via INTRAVENOUS
  Filled 2014-04-16: qty 2

## 2014-04-16 MED ORDER — HYDROCODONE-ACETAMINOPHEN 5-325 MG PO TABS: 1.0000 | ORAL_TABLET | Freq: Four times a day (QID) | ORAL | Status: DC | PRN

## 2014-04-16 MED ORDER — PANTOPRAZOLE SODIUM 40 MG IV SOLR
40.0000 mg | Freq: Once | INTRAVENOUS | Status: AC
  Administered 2014-04-16: 40 mg via INTRAVENOUS
  Filled 2014-04-16: qty 40

## 2014-04-16 MED ORDER — IOHEXOL 300 MG/ML  SOLN
50.0000 mL | Freq: Once | INTRAMUSCULAR | Status: AC | PRN
  Administered 2014-04-16: 50 mL via ORAL

## 2014-04-16 MED ORDER — HYDROMORPHONE HCL 1 MG/ML IJ SOLN
1.0000 mg | Freq: Once | INTRAMUSCULAR | Status: AC
  Administered 2014-04-16: 1 mg via INTRAVENOUS
  Filled 2014-04-16: qty 1

## 2014-04-16 MED ORDER — ONDANSETRON 4 MG PO TBDP: ORAL_TABLET | ORAL | Status: DC

## 2014-04-16 MED ORDER — SODIUM CHLORIDE 0.9 % IJ SOLN
INTRAMUSCULAR | Status: AC
  Filled 2014-04-16: qty 500

## 2014-04-16 MED ORDER — PANTOPRAZOLE SODIUM 20 MG PO TBEC: 20.0000 mg | DELAYED_RELEASE_TABLET | Freq: Every day | ORAL | Status: DC

## 2014-04-16 NOTE — ED Provider Notes (Signed)
CSN: [id number]     Arrival date & time 04/16/14  1059 History  This chart was scribed for Benny Lennert, MD by Ronney Lion, ED Scribe. This patient was seen in room APA05/AP[facility]05 and the patient's care was started at 12:15 PM.    Chief Complaint  Patient presents with  . Abdominal Pain   Patient is a 39 y.o. female presenting with abdominal pain.  Abdominal Pain Pain location:  Epigastric Pain radiates to:  Suprapubic region Pain severity:  Moderate Onset quality:  Sudden Duration:  4 hours Timing:  Constant Chronicity:  New Context comment:  Getting ready for work Relieved by:  Nothing Worsened by:  Nothing tried Ineffective treatments:  None tried Associated symptoms: no chest pain, no cough, no diarrhea, no fatigue and no hematuria   Risk factors: obesity     HPI Comments: Barbara [name] is a 39 y.o. female who presents to the Emergency Department complaining of constant, moderate epigastric abdominal pain that began this morning when she was getting ready for work. She has a history of endometrial ablation, which was performed because she had severe, painful menses, and has not had a menstrual period since. Patient's mother has a history of cholecystectomy. However, she denies a personal history of cholecystectomy or cholecystitis. She also denies a history of appendectomy.   Past Medical History  Diagnosis Date  . Migraine headache   . Depression   . Palpitations   . Panic attacks   . Anxiety   . Unspecified symptom associated with female genital organs 12/22/2012   Past Surgical History  Procedure Laterality Date  . Fracture surgery      left distal radius with internal fixation 11/01/2006  . Endometrial ablation    . Elbow surgery Left    Family History  Problem Relation Age of Onset  . Hypertension Father   . Hyperlipidemia Father   . Diabetes Father   . Diabetes Paternal Grandmother   . Hyperlipidemia Paternal Grandmother   . Hypertension Paternal Grandmother     History  Substance Use Topics  . Smoking status: Never Smoker   . Smokeless tobacco: Never Used  . Alcohol Use: No   OB History    Gravida Para Term Preterm AB TAB SAB Ectopic Multiple Living   Review of Systems  Constitutional: Negative for appetite change and fatigue.  HENT: Negative for congestion, ear discharge and sinus pressure.   Eyes: Negative for discharge.  Respiratory: Negative for cough.   Cardiovascular: Negative for chest pain.  Gastrointestinal: Positive for abdominal pain. Negative for diarrhea.  Genitourinary: Negative for frequency and hematuria.  Musculoskeletal: Negative for back pain.  Skin: Negative for rash.  Neurological: Negative for seizures and headaches.  Psychiatric/Behavioral: Negative for hallucinations.      Allergies  Latex and Penicillins  Home Medications   Prior to Admission medications   Medication Sig Start Date End Date Taking? Authorizing Provider  ALPRAZolam Prudy Feeler) 0.25 MG tablet Take 0.25 mg by mouth at bedtime as needed for sleep.    Historical Provider, MD  doxycycline (VIBRA-TABS) 100 MG tablet Take 1 tablet (100 mg total) by mouth 2 (two) times daily. 12/29/12   Adline Potter, NP  eletriptan (RELPAX) 40 MG tablet One tablet by mouth at onset of headache. May repeat in 2 hours if headache persists or recurs. may repeat in 2 hours if necessary    Historical Provider, MD  escitalopram (LEXAPRO) 5 MG tablet Take 5 mg by mouth daily.  10/20/12   Historical Provider, MD  Probiotic Product (PROBIOTIC DAILY PO) Take 1 tablet by mouth daily.    Historical Provider, MD  promethazine (PHENERGAN) 25 MG tablet Take 25 mg by mouth every 6 (six) hours as needed for nausea.    Historical Provider, MD   BP 127/82 mmHg  Pulse 91  Temp(Src) 99 F (37.2 C) (Oral)  Resp 16  Ht 5\' 7"  (1.702 m)  Wt 255 lb (115.667 kg)  BMI 39.93 kg/m2  SpO2 99% Physical Exam  Constitutional: She is oriented to person, place, and time.  She appears well-developed.  HENT:  Head: Normocephalic.  Eyes: Conjunctivae and EOM are normal. No scleral icterus.  Neck: Neck supple. No thyromegaly present.  Cardiovascular: Normal rate and regular rhythm.  Exam reveals no gallop and no friction rub.   No murmur heard. Pulmonary/Chest: No stridor. She has no wheezes. She has no rales. She exhibits no tenderness.  Abdominal: She exhibits no distension. There is tenderness. There is no rebound.  Moderate epigastric tenderness.  Musculoskeletal: Normal range of motion. She exhibits no edema.  Lymphadenopathy:    She has no cervical adenopathy.  Neurological: She is oriented to person, place, and time. She exhibits normal muscle tone. Coordination normal.  Skin: No rash noted. No erythema.  Psychiatric: She has a normal mood and affect. Her behavior is normal.  Nursing note and vitals reviewed.   ED Course  Procedures (including critical care time)  DIAGNOSTIC STUDIES: Oxygen Saturation is 99% on room air, normal by my interpretation.    COORDINATION OF CARE: 12:19 PM - Discussed treatment plan with pt at bedside which includes CT abdomen, and pt agreed to plan.   Labs Review Labs Reviewed  CBC  URINALYSIS, ROUTINE W REFLEX MICROSCOPIC  COMPREHENSIVE METABOLIC PANEL  PREGNANCY, URINE  LIPASE, BLOOD    Imaging Review No results found.   EKG Interpretation None      MDM   Final diagnoses:  None    Abdominal pain  The chart was scribed for me under my direct supervision.  I personally performed the history, physical, and medical decision making and all procedures in the evaluation of this patient.Benny Lennert.   Chi Garlow L Lusia Greis, MD 04/17/14 1420

## 2014-04-16 NOTE — ED Notes (Signed)
Pt c/o upper abd pain with radiation to lower pelvic area.

## 2014-04-16 NOTE — Discharge Instructions (Signed)
Workup for the abdominal pain without any significant abnormalities. CT scan showed that the gallbladder was unremarkable. Follow-up with your regular doctor. Based on that CT scan probably do not have gallbladder problems. But outpatient ultrasound could be arranged.  Take the Protonix as directed. Take the pain medicine as needed. Take Zofran as needed for nausea and vomiting. Make an appointment to follow-up with your doctor. Return for any new or worse symptoms.

## 2014-05-04 ENCOUNTER — Other Ambulatory Visit: Payer: Self-pay

## 2014-05-04 ENCOUNTER — Ambulatory Visit (INDEPENDENT_AMBULATORY_CARE_PROVIDER_SITE_OTHER): Payer: 59 | Admitting: Nurse Practitioner

## 2014-05-04 ENCOUNTER — Encounter: Payer: Self-pay | Admitting: Nurse Practitioner

## 2014-05-04 VITALS — BP 122/86 | HR 86 | Temp 98.5°F | Ht 67.0 in | Wt 264.2 lb

## 2014-05-04 DIAGNOSIS — R1013 Epigastric pain: Secondary | ICD-10-CM | POA: Insufficient documentation

## 2014-05-04 DIAGNOSIS — G8929 Other chronic pain: Secondary | ICD-10-CM

## 2014-05-04 NOTE — Patient Instructions (Signed)
1. Avoid all NSAID medications (Motrin, Ibuprofen, Aleve, etc) and Aspirin Powders (Goody/BC powder) 2. Continue taking your Protonix like the ER gave you 3. We will schedule your endoscopy for you 4. Further recommendations to be based on the results of your procedure. 5. Follow-up in 3 months

## 2014-05-04 NOTE — Assessment & Plan Note (Signed)
39-year-old female seen in the emergency room for epigastric pain with unremarkable labs and imaging. The likely etiology gastrointestinal, with the possibility of esophagitis/gastritis and/or PUD. Today patient states her symptoms are somewhat improved after her tonics given by the emergency room. However she continues to have occasional symptoms, which are intermittent. Symptoms include epigastric pain nausea and excessive belching. Patient counseled to avoid all NSAID medications as well as aspirin powders. Continue Protonix as ordered by the ER. We will plan for an upper endoscopy to evaluate further. Possible etiologies include esophagitis, gastritis, peptic ulcer disease. However cannot rule out more insidious etiology. Plan for routine follow-up in 3 months to evaluate symptoms further after endoscopy. At this time the red flag/warning signs or symptoms.  Proceed with EGD with pre-procedure phenergan 25 mg with Dr. Darrick PennaFields in near future: the risks, benefits, and alternatives have been discussed with the patient in detail. The patient states understanding and desires to proceed.

## 2014-05-04 NOTE — Progress Notes (Signed)
Primary Care Physician:  Donzetta Sprung, MD Primary Gastroenterologist:  Dr. Dr. Darrick Penna  Chief Complaint  Patient presents with  . Abdominal Pain  . ER follow up    HPI:   39 year old female presents for ER follow-up for abdominal pain. Pain started the morning of her ER visit. Pain was epigastric, sharp in charicteristis, "took my breath away, felt like someone had stabbed me." No history of GERD. Was on Aciphex after EGD back in 2009, results of EGD not currently available but will request them. In the ER labs including CBC, CMP, and lipase were all normal. CT abdomen and pelvis done at same time showed mild fatty infiltration of the liver, normal appendix, gallbladders, spleen, pancreas, adrenals, kidneys are all unremarkable. No acute process noted. Final diagnoses abdominal pain, determined likely etiology of GI possible gastritis/esophagitis and/or gastric ulcer. Was placed on Protonix daily by ED.   Today she states she continues to have intermittent pain which is not quite as bad as before starting the . Has  Protonix. Admits some nausea, denies vomiting, Has a bowel movement twice a day. Denies hematochezia, melena, and hematemesis . Pain is random in occurrence and not associated with eating. Denies bitter sour taste, admits excessive hiccups and belching. When she belches now, it has a "bubbly" sound to it, which is new.  she admits to taking NSAIDs for occasional headache approximately once per month but denies ASA powder use. Denies any other upper or lower GI symptoms.   Past Medical History  Diagnosis Date  . Migraine headache   . Depression   . Palpitations   . Panic attacks   . Anxiety   . Unspecified symptom associated with female genital organs 12/22/2012    Past Surgical History  Procedure Laterality Date  . Fracture surgery      left distal radius with internal fixation 11/01/2006  . Endometrial ablation    . Elbow surgery Left     Current Outpatient  Prescriptions  Medication Sig Dispense Refill  . ALPRAZolam (XANAX) 0.25 MG tablet Take 0.25 mg by mouth at bedtime as needed for sleep.    Marland Kitchen eletriptan (RELPAX) 40 MG tablet One tablet by mouth at onset of headache. May repeat in 2 hours if headache persists or recurs. may repeat in 2 hours if necessary    . HYDROcodone-acetaminophen (NORCO/VICODIN) 5-325 MG per tablet Take 1 tablet by mouth every 6 (six) hours as needed. 20 tablet 0  . ondansetron (ZOFRAN ODT) 4 MG disintegrating tablet  ODT q4 hours prn nausea/vomit 12 tablet 0  . pantoprazole (PROTONIX) 20 MG tablet Take 1 tablet (20 mg total) by mouth daily. 30 tablet 0  . PROAIR HFA 108 (90 BASE) MCG/ACT inhaler Inhale 2 puffs into the lungs every 4 (four) hours as needed.  0  . promethazine (PHENERGAN) 25 MG tablet Take 25 mg by mouth every 6 (six) hours as needed for nausea.     No current facility-administered medications for this visit.    Allergies as of 05/04/2014 - Review Complete 05/04/2014  Allergen Reaction Noted  . Penicillins    . Prednisone Hives 05/04/2014  . Latex Rash 07/23/2012    Family History  Problem Relation Age of Onset  . Hypertension Father   . Hyperlipidemia Father   . Diabetes Father   . Diabetes Paternal Grandmother   . Hyperlipidemia Paternal Grandmother   . Hypertension Paternal Grandmother     History   Social History  . Marital  Status: Married    Spouse Name: N/A  . Number of Children: N/A  . Years of Education: N/A   Occupational History  . Not on file.   Social History Main Topics  . Smoking status: Never Smoker   . Smokeless tobacco: Never Used  . Alcohol Use: No  . Drug Use: No  . Sexual Activity: Yes    Birth Control/ Protection: Other-see comments, None     Comment: vasectomy   Other Topics Concern  . Not on file   Social History Narrative    Review of Systems: General: Negative for anorexia, weight loss, fever, chills, fatigue, weakness. Eyes: Negative for  vision changes.  ENT: Negative for hoarseness, difficulty swallowing. CV: Negative for chest pain, angina, palpitations, dyspnea on exertion, peripheral edema.  Respiratory: Negative for dyspnea at rest, dyspnea on exertion, sputum, wheezing.  GI: See history of present illness. MS: Negative for joint pain, low back pain.  Derm: Negative for rash or generalized itching.  Neuro: Negative for weakness, seizure, memory loss, confusion.  Psych: Negative for anxiety, depression, suicidal ideation, hallucinations.  Endo: Negative for unusual weight change.  Heme: Negative for bruising or bleeding. Allergy: Negative for rash or hives.   Physical Exam: BP 122/86 mmHg  Pulse 86  Temp(Src) 98.5 F (36.9 C)  Ht 5\' 7"  (1.702 m)  Wt 264 lb 3.2 oz (119.84 kg)  BMI 41.37 kg/m2 General:   Alert and oriented. Pleasant and cooperative. Well-nourished and well-developed.  Head:  Normocephalic and atraumatic. Eyes:  Without icterus, sclera clear and conjunctiva pink.  Ears:  Normal auditory acuity. Mouth:  No deformity or lesions, oral mucosa pink. No OP edema.  Neck:  Supple, without mass or thyromegaly. Lungs:  Clear to auscultation bilaterally. No wheezes, rales, or rhonchi. No distress.  Heart:  S1, S2 present without murmurs appreciated.  Abdomen:  +BS, soft, non-distended.  mild to moderate epigastric TTP noted. No HSM noted. No guarding or rebound. No masses appreciated.  Rectal:  Deferred  Pulses:  Normal DP pulses noted. Extremities:  Without clubbing or edema. Neurologic:  Alert and  oriented x4;  grossly normal neurologically. Skin:  Intact without significant lesions or rashes. Cervical Nodes:  No significant cervical adenopathy. Psych:  Alert and cooperative. Normal mood and affect.     05/04/2014 9:00 AM

## 2014-05-05 NOTE — Progress Notes (Signed)
cc'ed to pcp °

## 2014-05-15 ENCOUNTER — Other Ambulatory Visit: Payer: Self-pay

## 2014-05-16 ENCOUNTER — Other Ambulatory Visit: Payer: Self-pay

## 2014-05-16 NOTE — Progress Notes (Unsigned)
Patient ID: Barbara [name], female   DOB: 1975-10-30, 39 y.o.   MRN: [id number] PA# for EGD [id number]

## 2014-05-17 MED ORDER — PANTOPRAZOLE SODIUM 20 MG PO TBEC: 20.0000 mg | DELAYED_RELEASE_TABLET | Freq: Every day | ORAL | Status: DC

## 2014-05-18 ENCOUNTER — Ambulatory Visit (HOSPITAL_COMMUNITY)
Admission: RE | Admit: 2014-05-18 | Discharge: 2014-05-18 | Disposition: A | Discharge status: Home / Self Care | Payer: 59 | Source: Ambulatory Visit | Attending: Gastroenterology | Admitting: Gastroenterology

## 2014-05-18 ENCOUNTER — Encounter (HOSPITAL_COMMUNITY)
Admission: RE | Disposition: A | Discharge status: Home / Self Care | Payer: Self-pay | Source: Ambulatory Visit | Attending: Gastroenterology

## 2014-05-18 ENCOUNTER — Encounter (HOSPITAL_COMMUNITY): Payer: Self-pay | Admitting: *Deleted

## 2014-05-18 DIAGNOSIS — G43909 Migraine, unspecified, not intractable, without status migrainosus: Secondary | ICD-10-CM | POA: Insufficient documentation

## 2014-05-18 DIAGNOSIS — Z91040 Latex allergy status: Secondary | ICD-10-CM | POA: Diagnosis not present

## 2014-05-18 DIAGNOSIS — Z880 Allergy status to penicillin: Secondary | ICD-10-CM | POA: Diagnosis not present

## 2014-05-18 DIAGNOSIS — Z79899 Other long term (current) drug therapy: Secondary | ICD-10-CM | POA: Diagnosis not present

## 2014-05-18 DIAGNOSIS — R1013 Epigastric pain: Secondary | ICD-10-CM | POA: Insufficient documentation

## 2014-05-18 DIAGNOSIS — F410 Panic disorder [episodic paroxysmal anxiety] without agoraphobia: Secondary | ICD-10-CM | POA: Insufficient documentation

## 2014-05-18 DIAGNOSIS — K209 Esophagitis, unspecified: Secondary | ICD-10-CM | POA: Diagnosis not present

## 2014-05-18 DIAGNOSIS — F419 Anxiety disorder, unspecified: Secondary | ICD-10-CM | POA: Insufficient documentation

## 2014-05-18 DIAGNOSIS — G8929 Other chronic pain: Secondary | ICD-10-CM

## 2014-05-18 DIAGNOSIS — K2900 Acute gastritis without bleeding: Secondary | ICD-10-CM | POA: Insufficient documentation

## 2014-05-18 DIAGNOSIS — F329 Major depressive disorder, single episode, unspecified: Secondary | ICD-10-CM | POA: Insufficient documentation

## 2014-05-18 HISTORY — DX: Other specified postprocedural states: Z98.890

## 2014-05-18 HISTORY — PX: ESOPHAGOGASTRODUODENOSCOPY: SHX5428

## 2014-05-18 HISTORY — DX: Other specified postprocedural states: R11.2

## 2014-05-18 SURGERY — EGD (ESOPHAGOGASTRODUODENOSCOPY)
Anesthesia: Moderate Sedation

## 2014-05-18 MED ORDER — STERILE WATER FOR IRRIGATION IR SOLN
Status: DC | PRN
  Administered 2014-05-18: 10:00:00

## 2014-05-18 MED ORDER — MIDAZOLAM HCL 5 MG/5ML IJ SOLN
INTRAMUSCULAR | Status: DC | PRN
  Administered 2014-05-18 (×3): 2 mg via INTRAVENOUS

## 2014-05-18 MED ORDER — PANTOPRAZOLE SODIUM 40 MG PO TBEC: DELAYED_RELEASE_TABLET | ORAL | Status: DC

## 2014-05-18 MED ORDER — MIDAZOLAM HCL 5 MG/5ML IJ SOLN
INTRAMUSCULAR | Status: AC
  Filled 2014-05-18: qty 10

## 2014-05-18 MED ORDER — LIDOCAINE VISCOUS 2 % MT SOLN
OROMUCOSAL | Status: DC | PRN
  Administered 2014-05-18: 1 via OROMUCOSAL

## 2014-05-18 MED ORDER — MEPERIDINE HCL 100 MG/ML IJ SOLN
INTRAMUSCULAR | Status: DC
Start: 2014-05-18 — End: 2014-05-18
  Filled 2014-05-18: qty 2

## 2014-05-18 MED ORDER — SODIUM CHLORIDE 0.9 % IJ SOLN
INTRAMUSCULAR | Status: AC
  Filled 2014-05-18: qty 3

## 2014-05-18 MED ORDER — LIDOCAINE VISCOUS 2 % MT SOLN
OROMUCOSAL | Status: AC
  Filled 2014-05-18: qty 15

## 2014-05-18 MED ORDER — SODIUM CHLORIDE 0.9 % IV SOLN
INTRAVENOUS | Status: DC
  Administered 2014-05-18: 09:00:00 via INTRAVENOUS

## 2014-05-18 MED ORDER — PROMETHAZINE HCL 25 MG/ML IJ SOLN
INTRAMUSCULAR | Status: AC
  Filled 2014-05-18: qty 1

## 2014-05-18 MED ORDER — MEPERIDINE HCL 100 MG/ML IJ SOLN
INTRAMUSCULAR | Status: DC | PRN
  Administered 2014-05-18 (×2): 50 mg

## 2014-05-18 MED ORDER — PROMETHAZINE HCL 25 MG/ML IJ SOLN
25.0000 mg | Freq: Once | INTRAMUSCULAR | Status: AC
  Administered 2014-05-18: 25 mg via INTRAVENOUS

## 2014-05-18 NOTE — Op Note (Signed)
[facility] [address] [place], [id number]   ENDOSCOPY PROCEDURE REPORT  PATIENT: Barbara [name], Barbara [name]  MR#: [id number] BIRTHDATE: Oct 13, 1975 , 38  yrs. old GENDER: female  ENDOSCOPIST: West BaliSandi L Sharri Loya, MD REFERRED MV:HQIONBY:Terry Reuel Boomaniel, M.D.  PROCEDURE DATE: 05/18/2014 PROCEDURE:   EGD w/ biopsy INDICATIONS:dyspepsia. PMHx: INCREASED EOSINOPHILS OIN ESOPHAGUS. OCCASIONAL DYSPHAGIA. 20 LB WEIGHT GAIN ISNCE 2007-BMI> 40. MEDICATIONS: PHENERGAN 25 MG IV, Demerol 100 mg IV, and Versed 6 mg IV TOPICAL ANESTHETIC:   Viscous Xylocaine ASA CLASS:  DESCRIPTION OF PROCEDURE:     Physical exam was performed.  Informed consent was obtained from the patient after explaining the benefits, risks, and alternatives to the procedure.  The patient was connected to the monitor and placed in the left lateral position.  Continuous oxygen was provided by nasal cannula and IV medicine administered through an indwelling cannula.  After administration of sedation, the patients esophagus was intubated and the EG-[id number] ([id number]  endoscope was advanced under direct visualization to the second portion of the duodenum.  The scope was removed slowly by carefully examining the color, texture, anatomy, and integrity of the mucosa on the way out.  The patient was recovered in endoscopy and discharged home in satisfactory condition.   ESOPHAGUS: FURROWED ESOPHAGUS.  COLD BIOPSIES OBTAINED 20 AND 34 CM FROM THE TEETH.  GE JUNCTION 39 CM FROM THE TEETH.   STOMACH: Mild non-erosive gastritis (inflammation) was found in the gastric antrum.  Multiple biopsies were performed using cold forceps. DUODENUM: The duodenal mucosa showed no abnormalities in the bulb and 2nd part of the duodenum. COMPLICATIONS: There were no immediate complications.  ENDOSCOPIC IMPRESSION: 1.   MILD ESOPHAGITIS 2.   MILD Non-erosive gastritis  RECOMMENDATIONS: CONTINUE PROTONIX.  TAKE 30 MINUTES PRIOR TO MEALS TWICE  DAILY. CONTINUE YOUR WEIGHT LOSS EFFORTS.  LOSE 10-20 LBS. STRICTLY FOLLOW A LOW FAT DIET. AWAIT BIOPSY. FOLLOW UP IN JUL 2016.  REPEAT EXAM:  eSigned:  West BaliSandi L Derral Colucci, MD 05/18/2014 11:24 AM   CPT CODES: ICD CODES:  The ICD and CPT codes recommended by this software are interpretations from the data that the clinical staff has captured with the software.  The verification of the translation of this report to the ICD and CPT codes and modifiers is the sole responsibility of the health care institution and practicing physician where this report was generated.  [employer] will not be held responsible for the validity of the ICD and CPT codes included on this report.  AMA assumes no liability for data contained or not contained herein. CPT is a Publishing rights managerregistered trademark of the [employer].

## 2014-05-18 NOTE — Discharge Instructions (Signed)
YOUR UPPER ABDOMINAL PAIN is most likel DUE TO REFLUX. YOU HAVE MIILD gastritis BECAUSE YOU'RE USING IBUPROFEN.  I biopsied your ESOPHAGUS & stomach.   CONTINUE PROTONIX. TAKE 30 MINUTES PRIOR TO MEALS TWICE DAILY.  CONTINUE YOUR WEIGHT LOSS EFFORTS. YOU SHOULD LOSE 10-20 LBS.  STRICTLY FOLLOW A LOW FAT DIET. SEE INFO BELOW.  YOUR BIOPSY RESULTS WILL BE AVAILABLE IN MY CHART AFTER APR 4 AND MY OFFICE WILL CONTACT YOU IN 10-14 DAYS WITH YOUR RESULTS.   FOLLOW UP IN JUL 2016.    UPPER ENDOSCOPY AFTER CARE Read the instructions outlined below and refer to this sheet in the next week. These discharge instructions provide you with general information on caring for yourself after you leave the hospital. While your treatment has been planned according to the most current medical practices available, unavoidable complications occasionally occur. If you have any problems or questions after discharge, call DR. Doreen Garretson, [phone].  ACTIVITY  You may resume your regular activity, but move at a slower pace for the next 24 hours.   Take frequent rest periods for the next 24 hours.   Walking will help get rid of the air and reduce the bloated feeling in your belly (abdomen).   No driving for 24 hours (because of the medicine (anesthesia) used during the test).   You may shower.   Do not sign any important legal documents or operate any machinery for 24 hours (because of the anesthesia used during the test).    NUTRITION  Drink plenty of fluids.   You may resume your normal diet as instructed by your doctor.   Begin with a light meal and progress to your normal diet. Heavy or fried foods are harder to digest and may make you feel sick to your stomach (nauseated).   Avoid alcoholic beverages for 24 hours or as instructed.    MEDICATIONS  You may resume your normal medications.   WHAT YOU CAN EXPECT TODAY  Some feelings of bloating in the abdomen.   Passage of more gas than  usual.    IF YOU HAD A BIOPSY TAKEN DURING THE UPPER ENDOSCOPY:  Eat a soft diet IF YOU HAVE NAUSEA, BLOATING, ABDOMINAL PAIN, OR VOMITING.    FINDING OUT THE RESULTS OF YOUR TEST Not all test results are available during your visit. DR. Darrick PennaFIELDS WILL CALL YOU WITHIN 14 DAYS OF YOUR PROCEDUE WITH YOUR RESULTS. Do not assume everything is normal if you have not heard from DR. Tnya Ades, CALL HER OFFICE AT [phone].  SEEK IMMEDIATE MEDICAL ATTENTION AND CALL THE OFFICE: [phone] IF:  You have more than a spotting of blood in your stool.   Your belly is swollen (abdominal distention).   You are nauseated or vomiting.   You have a temperature over 101F.   You have abdominal pain or discomfort that is severe or gets worse throughout the day.   Gastritis  Gastritis is an inflammation (the body's way of reacting to injury and/or infection) of the stomach. It is often caused by bacterial (germ) infections. It can also be caused BY ASPIRIN, BC/GOODY POWDER'S, (IBUPROFEN) MOTRIN, OR ALEVE (NAPROXEN), chemicals (including alcohol), SPICY FOODS, and medications. This illness may be associated with generalized malaise (feeling tired, not well), UPPER ABDOMINAL STOMACH cramps, and fever. One common bacterial cause of gastritis is an organism known as H. Pylori. This can be treated with antibiotics.   REFLUX   TREATMENT There are a number of medicines used to treat reflux including: Antacids.  ZANTAC Proton-pump inhibitors: PROTONIX  HOME CARE INSTRUCTIONS Eat 2-3 hours before going to bed.  Try to reach and maintain a healthy weight. LOSE 10-20 LBS Do not eat just a few very large meals. Instead, eat 4 TO 6 smaller meals throughout the day.  Try to identify foods and beverages that make your symptoms worse, and avoid these.  Avoid tight clothing.  Do not exercise right after eating.   Low-Fat Diet BREADS, CEREALS, PASTA, RICE, DRIED PEAS, AND BEANS These products are high in  carbohydrates and most are low in fat. Therefore, they can be increased in the diet as substitutes for fatty foods. They too, however, contain calories and should not be eaten in excess. Cereals can be eaten for snacks as well as for breakfast.  Include foods that contain fiber (fruits, vegetables, whole grains, and legumes). Research shows that fiber may lower blood cholesterol levels, especially the water-soluble fiber found in fruits, vegetables, oat products, and legumes. FRUITS AND VEGETABLES It is good to eat fruits and vegetables. Besides being sources of fiber, both are rich in vitamins and some minerals. They help you get the daily allowances of these nutrients. Fruits and vegetables can be used for snacks and desserts. MEATS Limit lean meat, chicken, [place], and fish to no more than 6 ounces per day. Beef, Pork, and Lamb Use lean cuts of beef, pork, and lamb. Lean cuts include:  Extra-lean ground beef.  Arm roast.  Sirloin tip.  Center-cut ham.  Round steak.  Loin chops.  Rump roast.  Tenderloin.  Trim all fat off the outside of meats before cooking. It is not necessary to severely decrease the intake of red meat, but lean choices should be made. Lean meat is rich in protein and contains a highly absorbable form of iron. Premenopausal women, in particular, should avoid reducing lean red meat because this could increase the risk for low red blood cells (iron-deficiency anemia).  Chicken and [place] These are good sources of protein. The fat of poultry can be reduced by removing the skin and underlying fat layers before cooking. Chicken and [place] can be substituted for lean red meat in the diet. Poultry should not be fried or covered with high-fat sauces. Fish and Shellfish Fish is a good source of protein. Shellfish contain cholesterol, but they usually are low in saturated fatty acids. The preparation of fish is important. Like chicken and [place], they should not be fried or covered  with high-fat sauces. EGGS Egg whites contain no fat or cholesterol. They can be eaten often. Try 1 to 2 egg whites instead of whole eggs in recipes or use egg substitutes that do not contain yolk.  MILK AND DAIRY PRODUCTS Use skim or 1% milk instead of 2% or whole milk. Decrease whole milk, natural, and processed cheeses. Use nonfat or low-fat (2%) cottage cheese or low-fat cheeses made from vegetable oils. Choose nonfat or low-fat (1 to 2%) yogurt. Experiment with evaporated skim milk in recipes that call for heavy cream. Substitute low-fat yogurt or low-fat cottage cheese for sour cream in dips and salad dressings. Have at least 2 servings of low-fat dairy products, such as 2 glasses of skim (or 1%) milk each day to help get your daily calcium intake.  FATS AND OILS Butterfat, lard, and beef fats are high in saturated fat and cholesterol. These should be avoided.Vegetable fats do not contain cholesterol. AVOID coconut oil, palm oil, and palm kernel oil, WHICH are very high in saturated fats. These should  be limited. These fats are often used in bakery goods, processed foods, popcorn, oils, and nondairy creamers. Vegetable shortenings and some peanut butters contain hydrogenated oils, which are also saturated fats. Read the labels on these foods and check for saturated vegetable oils.  Desirable liquid vegetable oils are corn oil, cottonseed oil, olive oil, canola oil, safflower oil, soybean oil, and sunflower oil. Peanut oil is not as good, but small amounts are acceptable. Buy a heart-healthy tub margarine that has no partially hydrogenated oils in the ingredients. AVOID Mayonnaise and salad dressings often are made from unsaturated fats.  OTHER EATING TIPS Snacks  Most sweets should be limited as snacks. They tend to be rich in calories and fats, and their caloric content outweighs their nutritional value. Some good choices in snacks are graham crackers, melba toast, soda crackers, bagels (no  egg), English muffins, fruits, and vegetables. These snacks are preferable to snack crackers, [place] fries, and chips. Popcorn should be air-popped or cooked in small amounts of liquid vegetable oil.  Desserts Eat fruit, low-fat yogurt, and fruit ices instead of pastries, cake, and cookies. Sherbet, angel food cake, gelatin dessert, frozen low-fat yogurt, or other frozen products that do not contain saturated fat (pure fruit juice bars, frozen ice pops) are also acceptable.   COOKING METHODS Choose those methods that use little or no fat. They include: Poaching.  Braising.  Steaming.  Grilling.  Baking.  Stir-frying.  Broiling.  Microwaving.  Foods can be cooked in a nonstick pan without added fat, or use a nonfat cooking spray in regular cookware. Limit fried foods and avoid frying in saturated fat. Add moisture to lean meats by using water, broth, cooking wines, and other nonfat or low-fat sauces along with the cooking methods mentioned above. Soups and stews should be chilled after cooking. The fat that forms on top after a few hours in the refrigerator should be skimmed off. When preparing meals, avoid using excess salt. Salt can contribute to raising blood pressure in some people.  EATING AWAY FROM HOME Order entres, potatoes, and vegetables without sauces or butter. When meat exceeds the size of a deck of cards (3 to 4 ounces), the rest can be taken home for another meal. Choose vegetable or fruit salads and ask for low-calorie salad dressings to b e served on the side. Use dressings sparingly. Limit high-fat toppings, such as bacon, crumbled eggs, cheese, sunflower seeds, and olives. Ask for heart-healthy tub margarine instead of butter.

## 2014-05-18 NOTE — H&P (Signed)
  Primary Care Physician:  Donzetta SprungANIEL, TERRY, MD Primary Gastroenterologist:  Dr. Darrick PennaFields  Pre-Procedure History & Physical: HPI:  Barbara [name] is a 39 y.o. female here for DYSPEPSIA.  Past Medical History  Diagnosis Date  . Migraine headache   . Depression   . Palpitations   . Panic attacks   . Anxiety   . Unspecified symptom associated with female genital organs 12/22/2012    Past Surgical History  Procedure Laterality Date  . Fracture surgery      left distal radius with internal fixation 11/01/2006  . Endometrial ablation    . Elbow surgery Left     Prior to Admission medications   Medication Sig Start Date End Date Taking? Authorizing Provider  ALPRAZolam Prudy Feeler(XANAX) 0.25 MG tablet Take 0.25 mg by mouth at bedtime as needed for sleep.   Yes Historical Provider, MD  eletriptan (RELPAX) 40 MG tablet One tablet by mouth at onset of headache. May repeat in 2 hours if headache persists or recurs. may repeat in 2 hours if necessary   Yes Historical Provider, MD  HYDROcodone-acetaminophen (NORCO/VICODIN) 5-325 MG per tablet Take 1 tablet by mouth every 6 (six) hours as needed. 04/16/14  Yes Bethann BerkshireJoseph Zammit, MD  ondansetron (ZOFRAN ODT) 4 MG disintegrating tablet 4mg  ODT q4 hours prn nausea/vomit 04/16/14  Yes Bethann BerkshireJoseph Zammit, MD  PROAIR HFA 108 [phone](90 BASE) MCG/ACT inhaler Inhale 2 puffs into the lungs every 4 (four) hours as needed. 01/17/14  Yes Historical Provider, MD  promethazine (PHENERGAN) 25 MG tablet Take 25 mg by mouth every 6 (six) hours as needed for nausea.   Yes Historical Provider, MD  pantoprazole (PROTONIX) 20 MG tablet Take 1 tablet (20 mg total) by mouth daily. 05/17/14   Tiffany KocherLeslie S Lewis, PA-C    Allergies as of 05/04/2014 - Review Complete 05/04/2014  Allergen Reaction Noted  . Penicillins    . Prednisone Hives 05/04/2014  . Latex Rash 07/23/2012    Family History  Problem Relation Age of Onset  . Hypertension Father   . Hyperlipidemia Father   . Diabetes Father   .  Diabetes Paternal Grandmother   . Hyperlipidemia Paternal Grandmother   . Hypertension Paternal Grandmother   . Colon cancer Neg Hx     History   Social History  . Marital Status: Married    Spouse Name: N/A  . Number of Children: N/A  . Years of Education: N/A   Occupational History  . Not on file.   Social History Main Topics  . Smoking status: Never Smoker   . Smokeless tobacco: Never Used  . Alcohol Use: No  . Drug Use: No  . Sexual Activity: Yes    Birth Control/ Protection: Other-see comments, None     Comment: vasectomy   Other Topics Concern  . Not on file   Social History Narrative    Review of Systems: See HPI, otherwise negative ROS   Physical Exam: There were no vitals taken for this visit. General:   Alert,  pleasant and cooperative in NAD Head:  Normocephalic and atraumatic. Neck:  Supple; Lungs:  Clear throughout to auscultation.    Heart:  Regular rate and rhythm. Abdomen:  Soft, nontender and nondistended. Normal bowel sounds, without guarding, and without rebound.   Neurologic:  Alert and  oriented x4;  grossly normal neurologically.  Impression/Plan:    DYSPEPSIA  PLAN:  EGD TODAY

## 2014-05-19 ENCOUNTER — Encounter (HOSPITAL_COMMUNITY): Payer: Self-pay | Admitting: Gastroenterology

## 2014-06-01 ENCOUNTER — Telehealth: Payer: Self-pay | Admitting: Gastroenterology

## 2014-06-01 NOTE — Telephone Encounter (Signed)
PLEASE CALL PT. HER ESOPHAGEAL BIOPSY SHOWS EOSINOPHILIC ESOPHAGITIS. HER STOMACH BIOPSIES SHOWS GASTRITIS.  SHE SHOULD CONTINUE PROTONIX. TAKE 30 MINUTES PRIOR TO MEALS TWICE DAILY. CONTINUE YOUR WEIGHT LOSS EFFORTS. FOLLOW A LOW FAT DIET.   FOLLOW UP IN JUL 2016 E30 ESOPHAGITIS/GASTRITIS.

## 2014-06-01 NOTE — Telephone Encounter (Signed)
Tried to call with no answer  

## 2014-06-05 NOTE — Telephone Encounter (Signed)
LMOM to call. Letter mailed also to call for results.

## 2014-06-06 ENCOUNTER — Telehealth: Payer: Self-pay | Admitting: Gastroenterology

## 2014-06-06 NOTE — Telephone Encounter (Signed)
Pt left VM also and said her call back number was [phone]. I returned call and gave her the results of EGD. See result note. Told her to call if she has questions.

## 2014-06-06 NOTE — Telephone Encounter (Signed)
Patient called this morning saying that she got something in her Mychart that we were trying to reach her. I asked her whose name was on it that sent it and she said DS. I told her that DS was helping with patients right now and I could take a message and have DS call her back. Patient said that she would call back because she was at work.

## 2014-06-13 ENCOUNTER — Other Ambulatory Visit: Payer: Self-pay

## 2014-06-14 MED ORDER — PANTOPRAZOLE SODIUM 40 MG PO TBEC: DELAYED_RELEASE_TABLET | ORAL | Status: DC

## 2014-06-15 ENCOUNTER — Other Ambulatory Visit: Payer: Self-pay

## 2014-06-15 ENCOUNTER — Telehealth: Payer: Self-pay | Admitting: General Practice

## 2014-06-15 MED ORDER — PANTOPRAZOLE SODIUM 40 MG PO TBEC: DELAYED_RELEASE_TABLET | ORAL | Status: DC

## 2014-06-15 NOTE — Addendum Note (Signed)
Addended by: Nira RetortSAMS, ANNA W on: 06/15/2014 12:32 PM   Modules accepted: Orders

## 2014-06-15 NOTE — Telephone Encounter (Signed)
Patient called in and stated that she needs a 90 day supply of Protonix sent to the [facility] in [place], so her insurance can pay.  They will only pay for a 90 day supply not 30 day supply.  Routing to Refill box.

## 2014-06-15 NOTE — Telephone Encounter (Signed)
Completed.

## 2014-07-27 ENCOUNTER — Encounter: Payer: Self-pay | Admitting: General Practice

## 2014-08-07 ENCOUNTER — Ambulatory Visit: Payer: Self-pay | Admitting: Nurse Practitioner

## 2014-08-22 ENCOUNTER — Ambulatory Visit: Payer: Self-pay | Admitting: Gastroenterology

## 2014-08-22 ENCOUNTER — Encounter: Payer: Self-pay | Admitting: Gastroenterology

## 2014-08-22 ENCOUNTER — Telehealth: Payer: Self-pay | Admitting: Gastroenterology

## 2014-08-22 NOTE — Telephone Encounter (Signed)
Letter mailed

## 2014-08-22 NOTE — Telephone Encounter (Signed)
Pt was a no show

## 2014-08-29 ENCOUNTER — Ambulatory Visit: Payer: Self-pay | Admitting: Nurse Practitioner

## 2014-11-17 ENCOUNTER — Emergency Department
Admission: EM | Admit: 2014-11-17 | Discharge: 2014-11-17 | Disposition: A | Discharge status: Home / Self Care | Payer: 59 | Source: Home / Self Care | Attending: Family Medicine | Admitting: Family Medicine

## 2014-11-17 DIAGNOSIS — J029 Acute pharyngitis, unspecified: Secondary | ICD-10-CM | POA: Diagnosis not present

## 2014-11-17 DIAGNOSIS — R0789 Other chest pain: Secondary | ICD-10-CM | POA: Diagnosis not present

## 2014-11-17 LAB — POCT RAPID STREP A (OFFICE): Rapid Strep A Screen: NEGATIVE

## 2014-11-17 NOTE — ED Notes (Signed)
Woke up Wednesday morning with sore throat and right clavicle/shoulder hurting. Sore throat has become worse in last couple of days, hurts to swallow, and pain up in ears. Shoulder/clavicle area hurts all the time according to patient

## 2014-11-17 NOTE — Discharge Instructions (Signed)

## 2014-11-17 NOTE — ED Provider Notes (Signed)
CSN: [id number]     Arrival date & time 11/17/14  1007 History   First MD Initiated Contact with Patient 11/17/14 1018     Chief Complaint  Patient presents with  . Sore Throat  . Shoulder Pain    right   (Consider location/radiation/quality/duration/timing/severity/associated sxs/prior Treatment) HPI  Pt is a 39yo female presenting to [facility] with c/o sudden onset sore throat and Right sided chest pain that started 2 days ago when she woke up.  Pt states Right anterior chest pain is aching and sore but then notes it hurts "all the time."  Pt most concerned about her sore throat that has gradually worsened over 2 days.  Pain is aching and sore, radiates up into her ears, 4/10 at this time. For anterior chest pain, pt denies known injury but states she does lots of heavy lifting at work.  Denies sick contacts or recent travel. Initially c/o congestion but then stated she has not had much congestion. Denies cough. Denies SOB but states she does get panic attacks which cause SOB. Denies CP or SOB at this time. Denies hx of blood clots, leg pain or swelling, or hx of CAD. Denies hx of asthma. Denies abdominal pain.  Past Medical History  Diagnosis Date  . Migraine headache   . Depression   . Palpitations   . Panic attacks   . Anxiety   . Unspecified symptom associated with female genital organs 12/22/2012  . Complication of anesthesia   . PONV (postoperative nausea and vomiting)    Past Surgical History  Procedure Laterality Date  . Fracture surgery      left distal radius with internal fixation 11/01/2006  . Endometrial ablation    . Elbow surgery Left   . Esophagogastroduodenoscopy N/A 05/18/2014    SLF: 1. Mild esophaigitis. 2. Mild non-erosive gastritis.    Family History  Problem Relation Age of Onset  . Hypertension Father   . Hyperlipidemia Father   . Diabetes Father   . Diabetes Paternal Grandmother   . Hyperlipidemia Paternal Grandmother   . Hypertension Paternal Grandmother     . Colon cancer Neg Hx    Social History  Substance Use Topics  . Smoking status: Never Smoker   . Smokeless tobacco: Never Used  . Alcohol Use: No   OB History    Gravida Para Term Preterm AB TAB SAB Ectopic Multiple Living   Review of Systems  Constitutional: Negative for fever and chills.  HENT: Positive for congestion, ear pain and sore throat. Negative for trouble swallowing and voice change.   Respiratory: Negative for cough and shortness of breath.   Cardiovascular: Negative for chest pain and palpitations.  Gastrointestinal: Negative for nausea, vomiting, abdominal pain and diarrhea.  Musculoskeletal: Negative for myalgias, back pain and arthralgias.  Skin: Negative for rash.  All other systems reviewed and are negative.   Allergies  Penicillins; Prednisone; and Latex  Home Medications   Prior to Admission medications   Medication Sig Start Date End Date Taking? Authorizing Provider  ALPRAZolam Prudy Feeler) 0.25 MG tablet Take 0.25 mg by mouth at bedtime as needed for sleep.    Historical Provider, MD  eletriptan (RELPAX) 40 MG tablet One tablet by mouth at onset of headache. May repeat in 2 hours if headache persists or recurs. may repeat in 2 hours if necessary    Historical Provider, MD  HYDROcodone-acetaminophen (NORCO/VICODIN) 5-325 MG per tablet  Take 1 tablet by mouth every 6 (six) hours as needed. 04/16/14   Bethann Berkshire, MD  ondansetron (ZOFRAN ODT) 4 MG disintegrating tablet  ODT q4 hours prn nausea/vomit 04/16/14   Bethann Berkshire, MD  pantoprazole (PROTONIX) 40 MG tablet 1 po 30 MINS PRIOR TO BREAKFAST AND SUPPER. 06/15/14   Nira Retort, NP  PROAIR HFA 108 (90 BASE) MCG/ACT inhaler Inhale 2 puffs into the lungs every 4 (four) hours as needed. 01/17/14   Historical Provider, MD  promethazine (PHENERGAN) 25 MG tablet Take 25 mg by mouth every 6 (six) hours as needed for nausea.    Historical Provider, MD   Meds Ordered and Administered this Visit   Medications - No data to display  BP 126/80 mmHg  Pulse 90  Temp(Src) 99.5 F (37.5 C) (Oral)  Ht  (1.702 m)  Wt 276 lb 8 oz (125.42 kg)  BMI 43.30 kg/m2  SpO2 96% No data found.   Physical Exam  Constitutional: She appears well-developed and well-nourished. No distress.  HENT:  Head: Normocephalic and atraumatic.  Right Ear: Hearing, tympanic membrane, external ear and ear canal normal.  Left Ear: Hearing, tympanic membrane, external ear and ear canal normal.  Nose: Nose normal.  Mouth/Throat: Uvula is midline and mucous membranes are normal. Posterior oropharyngeal erythema present.  Eyes: Conjunctivae are normal. No scleral icterus.  Neck: Normal range of motion. Neck supple.  Cardiovascular: Normal rate, regular rhythm and normal heart sounds.   Pulmonary/Chest: Effort normal and breath sounds normal. No respiratory distress. She has no wheezes. She has no rales. She exhibits tenderness.    Right anterior chest wall tenderness. No deformity or crepitus. No respiratory distress. Lungs: CTAB  Abdominal: Soft. She exhibits no distension. There is no tenderness.  Musculoskeletal: Normal range of motion.  Lymphadenopathy:    She has no cervical adenopathy.  Neurological: She is alert.  Skin: Skin is warm and dry. She is not diaphoretic.  Nursing note and vitals reviewed.   ED Course  Procedures (including critical care time)  Labs Review Labs Reviewed  STREP A DNA PROBE  POCT RAPID STREP A (OFFICE)    Imaging Review No results found.     MDM   1. Sore throat   2. Right-sided chest wall pain     Pt c/o sore throat and reproducible Right side chest pain that hurts "all the time"  Pt most concerned with sore throat.  Vitals: unremarkable, Tem- 99.5, HR- 90, BP- 126/80, O2 Sat 96% on RA.  Rapid strep: negative. No evidence of peritonsillar abscess No evidence of emergent process taking place at this time including PE or MI. Pt is PERC negative. Chest  pain is constant, and no cardiac hx. Hx and exam inconsistent with ACS. Symptoms with sore throat likely viral in nature. Encouraged fluids, rest, acetaminophen and ibuprofen. Chest pain likely muscular in nature as pt does heavy lifting at work. F/u with PCP in 4-5 days if not improving, sooner if worsening. Pt requested work note to allow her to go back to work today. Patient verbalized understanding and agreement with treatment plan.     Junius Finner, PA-C 11/17/14 1052

## 2014-11-18 LAB — STREP A DNA PROBE: GASP: NEGATIVE

## 2014-11-19 ENCOUNTER — Telehealth: Payer: Self-pay | Admitting: Emergency Medicine

## 2015-05-08 ENCOUNTER — Other Ambulatory Visit: Payer: Self-pay

## 2015-05-08 MED ORDER — PANTOPRAZOLE SODIUM 40 MG PO TBEC: DELAYED_RELEASE_TABLET | ORAL | Status: DC

## 2015-05-31 ENCOUNTER — Other Ambulatory Visit: Payer: Self-pay

## 2015-05-31 MED ORDER — PANTOPRAZOLE SODIUM 40 MG PO TBEC: DELAYED_RELEASE_TABLET | ORAL | Status: DC

## 2015-06-11 ENCOUNTER — Other Ambulatory Visit: Payer: Self-pay

## 2015-06-11 MED ORDER — PANTOPRAZOLE SODIUM 40 MG PO TBEC: DELAYED_RELEASE_TABLET | ORAL | Status: DC

## 2015-10-11 ENCOUNTER — Telehealth: Payer: Self-pay

## 2015-10-11 MED ORDER — PANTOPRAZOLE SODIUM 40 MG PO TBEC: DELAYED_RELEASE_TABLET | ORAL | 3 refills | Status: DC

## 2015-10-11 NOTE — Telephone Encounter (Signed)
LMOM that RX has been sent in.

## 2015-10-11 NOTE — Telephone Encounter (Signed)
pts husband- Casimiro NeedleMichael called and left voicemail. Pt needs refills on her ppi sent to [facility].

## 2015-10-11 NOTE — Telephone Encounter (Signed)
Noted  

## 2015-10-11 NOTE — Telephone Encounter (Signed)
Sent to [facility] 

## 2015-11-06 ENCOUNTER — Other Ambulatory Visit: Payer: Self-pay | Admitting: Physical Medicine and Rehabilitation

## 2015-11-06 ENCOUNTER — Ambulatory Visit (INDEPENDENT_AMBULATORY_CARE_PROVIDER_SITE_OTHER): Payer: 59

## 2015-11-06 DIAGNOSIS — R52 Pain, unspecified: Secondary | ICD-10-CM

## 2015-11-06 DIAGNOSIS — M545 Low back pain: Secondary | ICD-10-CM

## 2016-02-15 IMAGING — CT CT ABD-PELV W/ CM
2 of 5 series · 16 of 46 positions shown, 18 images · IV contrast (Omnipaque 300)
Comparison: 08/24/2003

CLINICAL DATA: Mid to lower left abdominal pain.

EXAM:
CT ABDOMEN AND PELVIS WITH CONTRAST
TECHNIQUE: Multidetector CT imaging of the abdomen and pelvis was performed
using the standard protocol following bolus administration of
intravenous contrast.
CONTRAST:  100mL OMNIPAQUE IOHEXOL 300 MG/ML SOLN, 50mL OMNIPAQUE
IOHEXOL 300 MG/ML SOLN

[Series 2: abd_pel_with 5.0 b40f · axial · 0.87mm/px · z∈[-542,-66]mm · 13 of 107 slices shown, 15 images]
[im 6/107  soft-tissue]
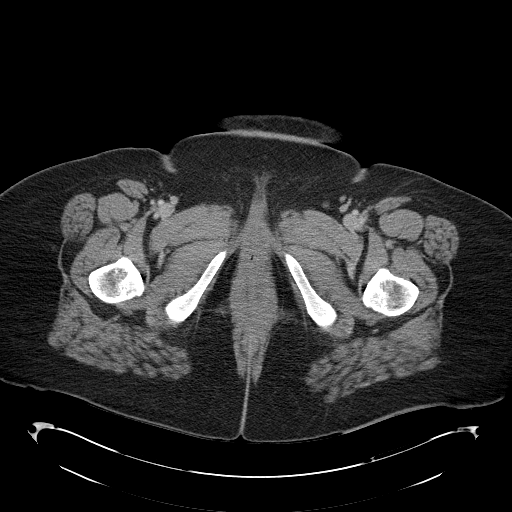
[im 6/107  bone]
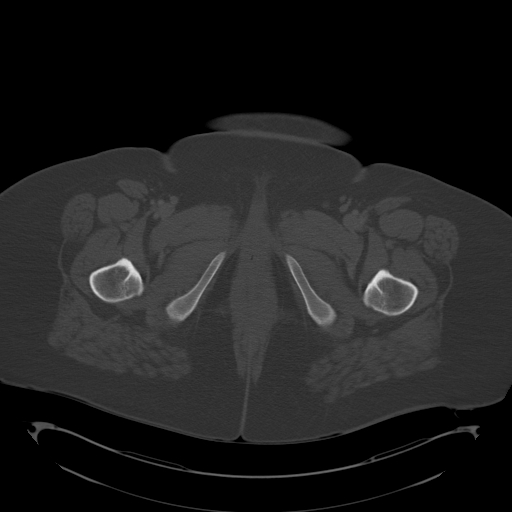
[im 17/107  soft-tissue]
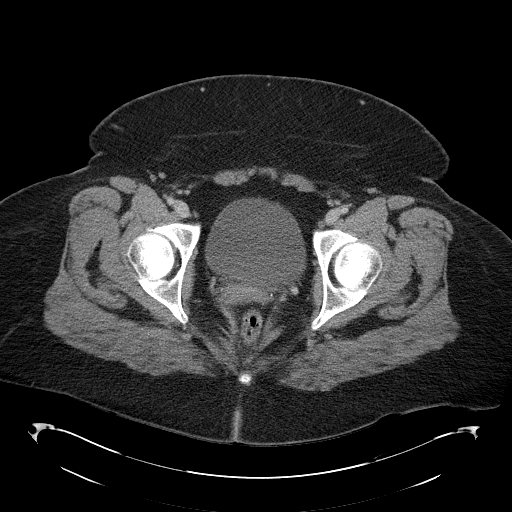
[im 23/107  soft-tissue]
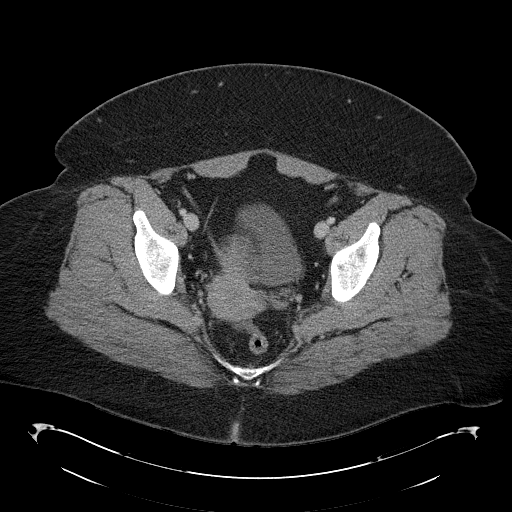
[im 28/107  soft-tissue]
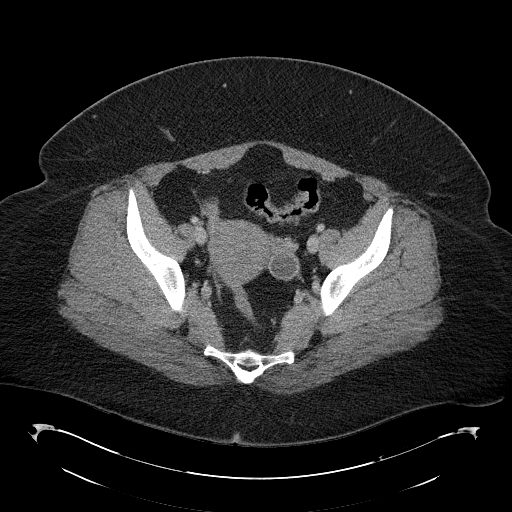
[im 40/107  soft-tissue]
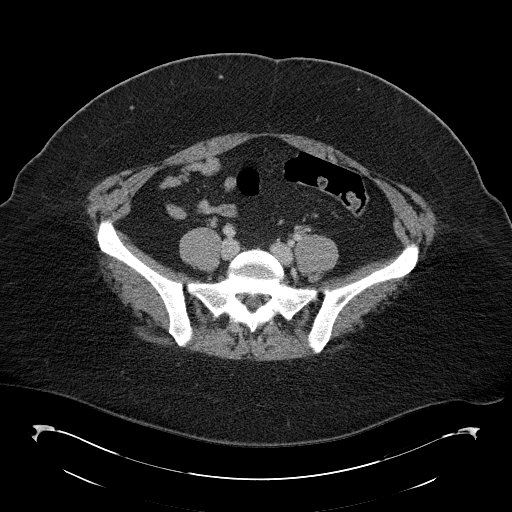
[im 45/107  soft-tissue]
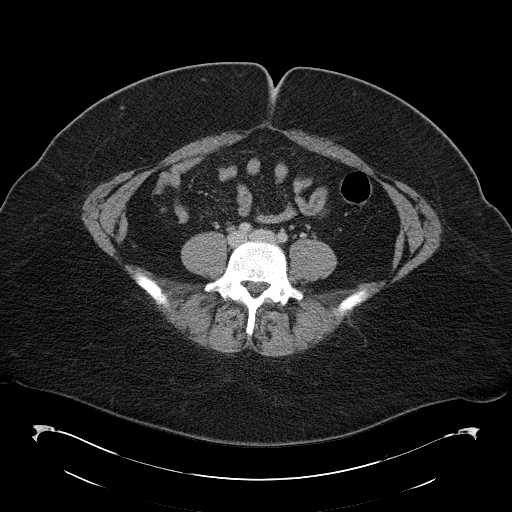
[im 56/107  soft-tissue]
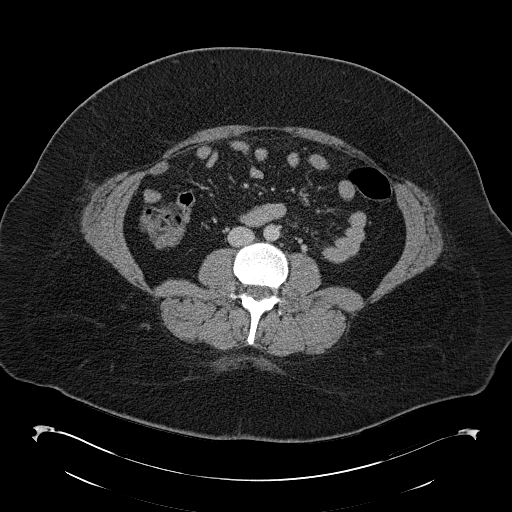
[im 62/107  soft-tissue]
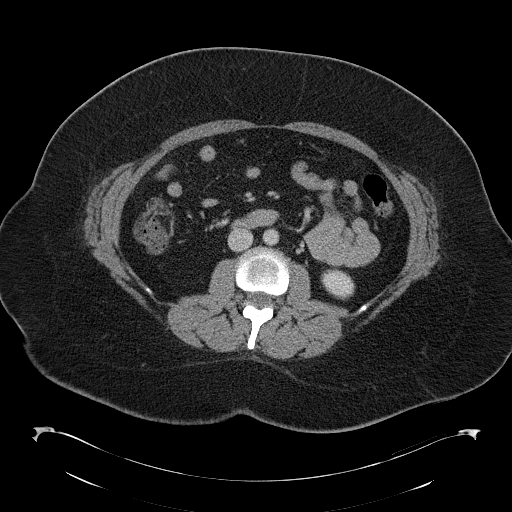
[im 67/107  soft-tissue]
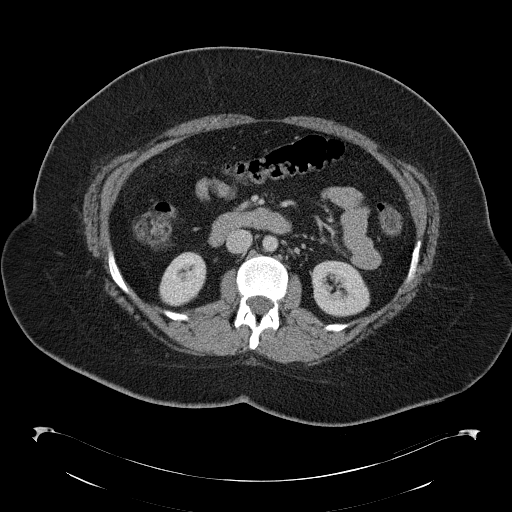
[im 67/107  bone]
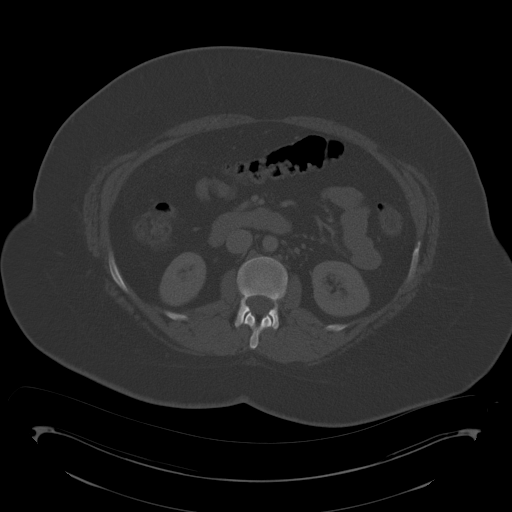
[im 79/107  soft-tissue]
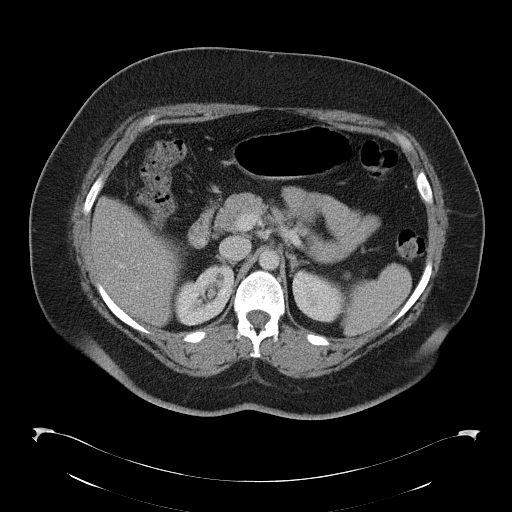
[im 84/107  soft-tissue]
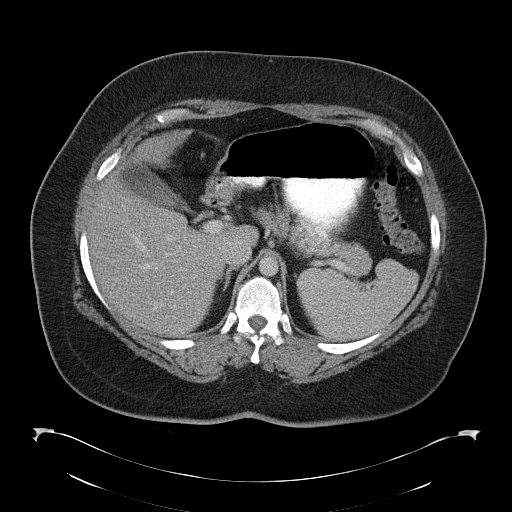
[im 90/107  soft-tissue]
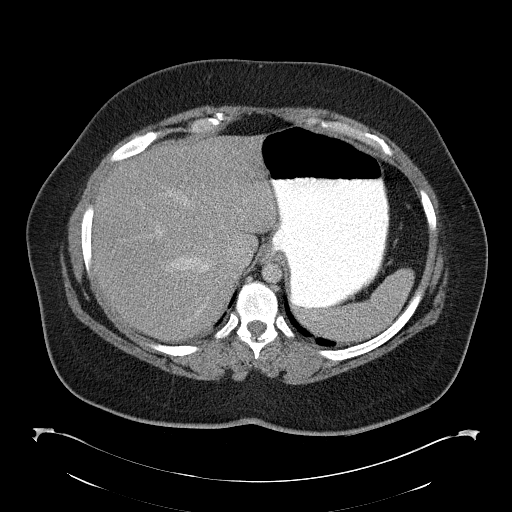
[im 101/107  soft-tissue]
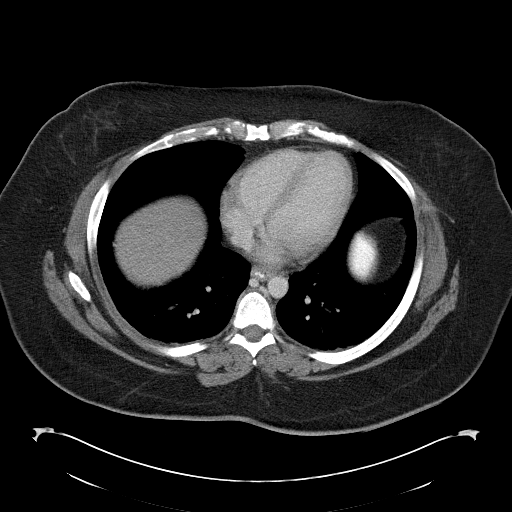

[Series 3: abd_pel_with 3.0 spo · coronal · 0.78mm/px · 3 of 106 slices shown]
[im 36/106  soft-tissue]
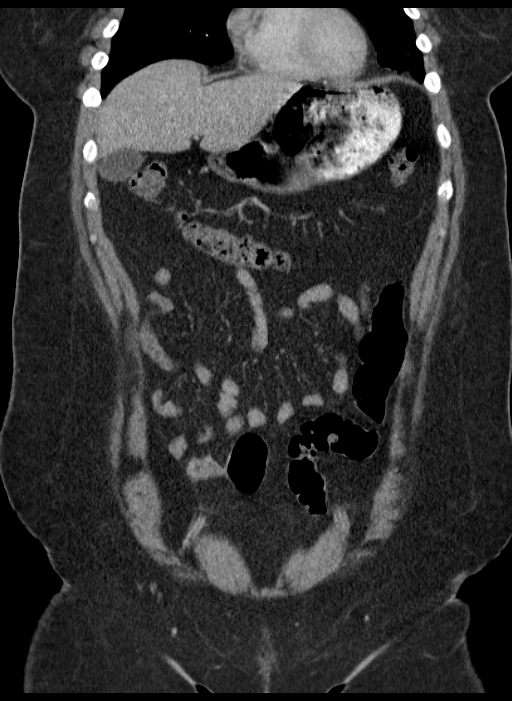
[im 47/106  soft-tissue]
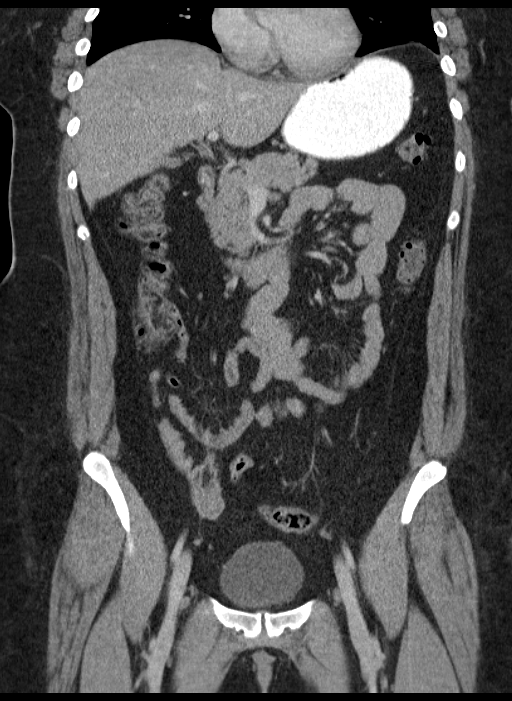
[im 59/106  soft-tissue]
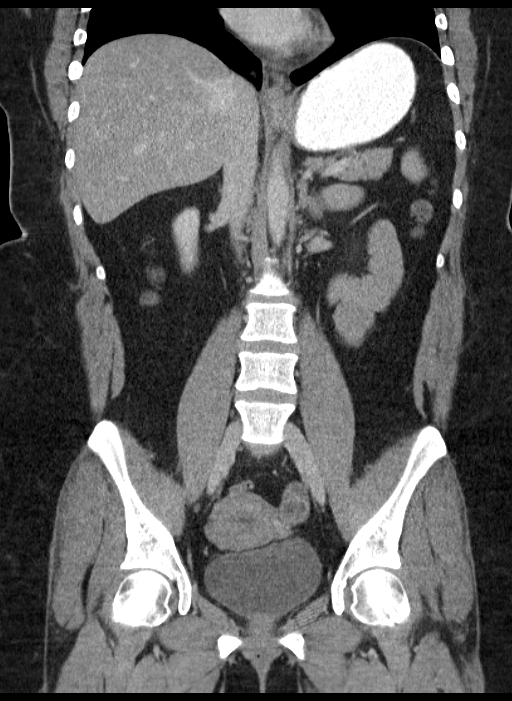

[16 of 46 positions shown; findings below may reference images not displayed]

FINDINGS: Dependent bibasilar atelectasis. Heart is normal size. No effusions.

Mild diffuse fatty infiltration of the liver without focal
abnormality. Gallbladder, spleen, pancreas, adrenals, kidneys are
unremarkable.

Appendix is visualized and is normal. Stomach, large and small bowel
are unremarkable.

Uterus, adnexae and urinary bladder are unremarkable. Dominant left
ovarian follicle noted. No free fluid, free air or adenopathy. Aorta
is normal caliber.

No acute bony abnormality or focal bone lesion.
IMPRESSION: Mild fatty infiltration of the liver.

Normal appendix.

Dependent atelectasis in the lung bases.

## 2016-03-21 DIAGNOSIS — R110 Nausea: Secondary | ICD-10-CM | POA: Diagnosis not present

## 2016-03-25 ENCOUNTER — Encounter: Payer: Self-pay | Admitting: Adult Health

## 2016-03-25 ENCOUNTER — Other Ambulatory Visit (HOSPITAL_COMMUNITY)
Admission: RE | Admit: 2016-03-25 | Discharge: 2016-03-25 | Disposition: A | Discharge status: Home / Self Care | Payer: 59 | Source: Ambulatory Visit | Attending: Adult Health | Admitting: Adult Health

## 2016-03-25 ENCOUNTER — Ambulatory Visit (INDEPENDENT_AMBULATORY_CARE_PROVIDER_SITE_OTHER): Payer: 59 | Admitting: Adult Health

## 2016-03-25 VITALS — BP 110/80 | HR 88 | Ht 66.0 in | Wt 254.5 lb

## 2016-03-25 DIAGNOSIS — Z1211 Encounter for screening for malignant neoplasm of colon: Secondary | ICD-10-CM | POA: Diagnosis not present

## 2016-03-25 DIAGNOSIS — Z1151 Encounter for screening for human papillomavirus (HPV): Secondary | ICD-10-CM | POA: Diagnosis not present

## 2016-03-25 DIAGNOSIS — Z1212 Encounter for screening for malignant neoplasm of rectum: Secondary | ICD-10-CM

## 2016-03-25 DIAGNOSIS — Z01419 Encounter for gynecological examination (general) (routine) without abnormal findings: Secondary | ICD-10-CM | POA: Insufficient documentation

## 2016-03-25 LAB — HEMOCCULT GUIAC POC 1CARD (OFFICE): Fecal Occult Blood, POC: NEGATIVE

## 2016-03-25 NOTE — Patient Instructions (Signed)
Physical in 1 year Pap in 3 if normal Mammogram yearly Labs with Dr Eduard ClosBethea

## 2016-03-25 NOTE — Progress Notes (Signed)
Patient ID: Barbara [name], female   DOB: [birth year], 41 y.o.   MRN: [id number] History of Present Illness: Barbara [name] is a 41 year old white female, married in for a well woman gyn exam, and pap.PCP is [facility] and she sees Dr Eduard ClosBethea  For thyroid and labs.   Current Medications, Allergies, Past Medical History, Past Surgical History, Family History and Social History were reviewed in [facility] electronic medical record.     Review of Systems: Patient denies any headaches, hearing loss, fatigue, blurred vision, shortness of breath, chest pain, abdominal pain, problems with bowel movements, urination, or intercourse. No joint pain or mood swings.    Physical Exam:BP 110/80 (BP Location: Left Arm, Patient Position: Sitting, Cuff Size: Large)   Pulse 88   Ht 5\' 6"  (1.676 m)   Wt 254 lb 8 oz (115.4 kg)   BMI 41.08 kg/m  General:  Well developed, well nourished, no acute distress Skin:  Warm and dry,has tattoos Neck:  Midline trachea, normal thyroid, good ROM, no lymphadenopathy Lungs; Clear to auscultation bilaterally Breast:  No dominant palpable mass, retraction, or nipple discharge Cardiovascular: Regular rate and rhythm Abdomen:  Soft, non tender, no hepatosplenomegaly Pelvic:  External genitalia is normal in appearance, no lesions.  The vagina is normal in appearance. Urethra has no lesions or masses. The cervix is bulbous.Pap with HPV performed.  Uterus is felt to be normal size, shape, and contour.  No adnexal masses or tenderness noted.Bladder is non tender, no masses felt. Rectal: Good sphincter tone, no polyps, or hemorrhoids felt.  Hemoccult negative. Extremities/musculoskeletal:  No swelling or varicosities noted, no clubbing or cyanosis Psych:  No mood changes, alert and cooperative,seems happy PHQ 2 score 0  Impression: 1. Encounter for gynecological examination with Papanicolaou smear of cervix   2. Screening for colorectal cancer       Plan:  Physical in 1  year Pap in 3 if normal Mammogram yearly Labs with Dr Eduard ClosBethea

## 2016-03-27 LAB — CYTOLOGY - PAP
Diagnosis: NEGATIVE
HPV: NOT DETECTED

## 2016-09-02 ENCOUNTER — Ambulatory Visit (INDEPENDENT_AMBULATORY_CARE_PROVIDER_SITE_OTHER): Payer: 59 | Admitting: Family Medicine

## 2016-09-02 ENCOUNTER — Encounter: Payer: Self-pay | Admitting: Family Medicine

## 2016-09-02 VITALS — BP 136/71 | HR 67 | Temp 98.7°F | Ht 66.0 in | Wt 262.0 lb

## 2016-09-02 DIAGNOSIS — E039 Hypothyroidism, unspecified: Secondary | ICD-10-CM | POA: Insufficient documentation

## 2016-09-02 DIAGNOSIS — J029 Acute pharyngitis, unspecified: Secondary | ICD-10-CM

## 2016-09-02 DIAGNOSIS — F411 Generalized anxiety disorder: Secondary | ICD-10-CM

## 2016-09-02 DIAGNOSIS — R1013 Epigastric pain: Secondary | ICD-10-CM

## 2016-09-02 LAB — TSH: TSH: 0.9 mIU/L

## 2016-09-02 LAB — COMPLETE METABOLIC PANEL WITH GFR
ALBUMIN: 4.1 g/dL (ref 3.6–5.1)
ALK PHOS: 54 U/L (ref 33–115)
ALT: 18 U/L (ref 6–29)
AST: 21 U/L (ref 10–30)
BUN: 9 mg/dL (ref 7–25)
CALCIUM: 9.5 mg/dL (ref 8.6–10.2)
CO2: 26 mmol/L (ref 20–31)
Chloride: 104 mmol/L (ref 98–110)
Creat: 0.78 mg/dL (ref 0.50–1.10)
GFR, Est Non African American: >89 mL/min (ref >=60)
Glucose, Bld: 84 mg/dL (ref 65–99)
POTASSIUM: 4.5 mmol/L (ref 3.5–5.3)
Sodium: 138 mmol/L (ref 135–146)
Total Bilirubin: 0.8 mg/dL (ref 0.2–1.2)
Total Protein: 6.8 g/dL (ref 6.1–8.1)

## 2016-09-02 LAB — LIPID PANEL W/REFLEX DIRECT LDL
CHOL/HDL RATIO: 3.2 ratio (ref <5.0)
CHOLESTEROL: 170 mg/dL (ref <200)
HDL: 53 mg/dL (ref >50)
LDL-Cholesterol: 101 mg/dL — ABNORMAL HIGH
Non-HDL Cholesterol (Calc): 117 mg/dL (ref <130)
Triglycerides: 75 mg/dL (ref <150)

## 2016-09-02 LAB — CBC
HCT: 43.7 % (ref 35.0–45.0)
Hemoglobin: 15 g/dL (ref 11.7–15.5)
MCH: 30.2 pg (ref 27.0–33.0)
MCHC: 34.3 g/dL (ref 32.0–36.0)
MCV: 87.9 fL (ref 80.0–100.0)
MPV: 9.5 fL (ref 7.5–12.5)
Platelets: 441 10*3/uL — ABNORMAL HIGH (ref 140–400)
RBC: 4.97 MIL/uL (ref 3.80–5.10)
RDW: 13.4 % (ref 11.0–15.0)
WBC: 8.5 10*3/uL (ref 3.8–10.8)

## 2016-09-02 LAB — T4, FREE: Free T4: 0.8 ng/dL (ref 0.8–1.8)

## 2016-09-02 LAB — T3, FREE: T3 FREE: 6 pg/mL — AB (ref 2.3–4.2)

## 2016-09-02 MED ORDER — ONDANSETRON HCL 4 MG PO TABS: 4.0000 mg | ORAL_TABLET | Freq: Three times a day (TID) | ORAL | 12 refills | Status: DC | PRN

## 2016-09-02 MED ORDER — AZITHROMYCIN 250 MG PO TABS: 250.0000 mg | ORAL_TABLET | Freq: Every day | ORAL | 0 refills | Status: DC

## 2016-09-02 MED ORDER — ALPRAZOLAM 0.25 MG PO TABS: 0.2500 mg | ORAL_TABLET | Freq: Two times a day (BID) | ORAL | 1 refills | Status: DC | PRN

## 2016-09-02 MED ORDER — PREDNISONE 10 MG PO TABS: 30.0000 mg | ORAL_TABLET | Freq: Every day | ORAL | 0 refills | Status: DC

## 2016-09-02 NOTE — Patient Instructions (Addendum)
Thank you for coming in today. Use xanax sparingly.  Continue over the counter medicines for sore throat.  Do the exercises we discuss for Plantar Fasciitis.  Do the ice massage.   Recheck in 6 weeks if not better.    If you sore throat worsens take the azithromycin.   We will get lab results to you soonish.    We can discuss weight loss when life is more stable.    Plantar Fasciitis Plantar fasciitis is a painful foot condition that affects the heel. It occurs when the band of tissue that connects the toes to the heel bone (plantar fascia) becomes irritated. This can happen after exercising too much or doing other repetitive activities (overuse injury). The pain from plantar fasciitis can range from mild irritation to severe pain that makes it difficult for you to walk or move. The pain is usually worse in the morning or after you have been sitting or lying down for a while. What are the causes? This condition may be caused by:  Standing for long periods of time.  Wearing shoes that do not fit.  Doing high-impact activities, including running, aerobics, and ballet.  Being overweight.  Having an abnormal way of walking (gait).  Having tight calf muscles.  Having high arches in your feet.  Starting a new athletic activity.  What are the signs or symptoms? The main symptom of this condition is heel pain. Other symptoms include:  Pain that gets worse after activity or exercise.  Pain that is worse in the morning or after resting.  Pain that goes away after you walk for a few minutes.  How is this diagnosed? This condition may be diagnosed based on your signs and symptoms. Your health care provider will also do a physical exam to check for:  A tender area on the bottom of your foot.  A high arch in your foot.  Pain when you move your foot.  Difficulty moving your foot.  You may also need to have imaging studies to confirm the diagnosis. These can  include:  X-rays.  Ultrasound.  MRI.  How is this treated? Treatment for plantar fasciitis depends on the severity of the condition. Your treatment may include:  Rest, ice, and over-the-counter pain medicines to manage your pain.  Exercises to stretch your calves and your plantar fascia.  A splint that holds your foot in a stretched, upward position while you sleep (night splint).  Physical therapy to relieve symptoms and prevent problems in the future.  Cortisone injections to relieve severe pain.  Extracorporeal shock wave therapy (ESWT) to stimulate damaged plantar fascia with electrical impulses. It is often used as a last resort before surgery.  Surgery, if other treatments have not worked after 12 months.  Follow these instructions at home:  Take medicines only as directed by your health care provider.  Avoid activities that cause pain.  Roll the bottom of your foot over a bag of ice or a bottle of cold water. Do this for 20 minutes, 3-4 times a day.  Perform simple stretches as directed by your health care provider.  Try wearing athletic shoes with air-sole or gel-sole cushions or soft shoe inserts.  Wear a night splint while sleeping, if directed by your health care provider.  Keep all follow-up appointments with your health care provider. How is this prevented?  Do not perform exercises or activities that cause heel pain.  Consider finding low-impact activities if you continue to have problems.  Lose  weight if you need to. The best way to prevent plantar fasciitis is to avoid the activities that aggravate your plantar fascia. Contact a health care provider if:  Your symptoms do not go away after treatment with home care measures.  Your pain gets worse.  Your pain affects your ability to move or do your daily activities. This information is not intended to replace advice given to you by your health care provider. Make sure you discuss any questions you  have with your health care provider. Document Released: 10/29/2000 Document Revised: 07/09/2015 Document Reviewed: 12/14/2013 Elsevier Interactive Patient Education  [employer]2018 Elsevier Inc.

## 2016-09-02 NOTE — Progress Notes (Signed)
Barbara [name] is a 41 y.o. female who [facility] today for establish care and discuss sore throat foot pain and anxiety.  Sore throat. Patient has a three-day history of mild sore throat associated with nasal congestion and postnasal drainage. No fevers or chills vomiting or diarrhea. Patient has tried some over-the-counter medications which have helped. She is leaving for a conference soon is worried that she will get sick while away.  Foot pain: Patient has bilateral plantar foot pain. This is been present for about a week and a half. She has pain at the right plantar calcaneus and along the longitudinal arch of the left foot. The pain is worse when she first stands in the morning and with activity. She thinks she probably has plantar fasciitis. She has tried some icing which have helped a bit. Additionally she has purchased some insoles for her shoes which also helped a bit.  Anxiety: Patient has a history of intermittent anxiety. In the past she took Xanax which helped. She thinks she would take it a few times a month at most. She would like a refill if possible. She notes that her son is currently dealing with depression and she has a niece who is dying from cancer. These are both sources of stress.  Hypothyroidism: Patient was previously diagnosed with hypothyroidism. She takes thyroid hormones daily.    Past Medical History:  Diagnosis Date  . Anxiety   . Complication of anesthesia   . Depression   . Migraine headache   . Palpitations   . Panic attacks   . PONV (postoperative nausea and vomiting)   . Thyroid disease   . Unspecified symptom associated with female genital organs 12/22/2012   Past Surgical History:  Procedure Laterality Date  . ELBOW SURGERY Left   . ENDOMETRIAL ABLATION    . ESOPHAGOGASTRODUODENOSCOPY N/A 05/18/2014   SLF: 1. Mild  esophaigitis. 2. Mild non-erosive gastritis.   Marland Kitchen. FRACTURE SURGERY     left distal radius with internal fixation 11/01/2006   Social History  Substance Use Topics  . Smoking status: Never Smoker  . Smokeless tobacco: Never Used  . Alcohol use No   family history includes Diabetes in her father and paternal grandmother; Hyperlipidemia in her father and paternal grandmother; Hypertension in her father and paternal grandmother.  ROS: No new  headache, visual changes, nausea, vomiting, diarrhea, constipation, dizziness, abdominal pain, skin rash, fevers, chills, night sweats, weight loss, swollen lymph nodes, body aches, joint swelling, muscle aches, chest pain, shortness of breath, mood changes, visual or auditory hallucinations.    Medications: Current Outpatient Prescriptions  Medication Sig Dispense Refill  . ALPRAZolam (XANAX) 0.25 MG tablet Take 1 tablet (0.25 mg total) by mouth 2 (two) times daily as needed for anxiety. 60 tablet 1  . cetirizine (ZYRTEC) 10 MG tablet Take 10 mg by mouth daily.    . Cholecalciferol (VITAMIN D3 PO) Take 1,000 Int'l Units by mouth daily.     . Cyanocobalamin (VITAMIN B-12 PO) Take 1,000 mg by mouth daily.     Marland Kitchen. eletriptan (RELPAX) 40 MG tablet One tablet by mouth at onset of headache. May repeat in 2 hours if headache persists or recurs. may repeat in 2 hours if necessary    . liothyronine (CYTOMEL) 5 MCG tablet Takes 4 tabs daily in the am.    . pantoprazole (PROTONIX) 40 MG tablet 1 po 30 MINS PRIOR TO BREAKFAST  AND SUPPER. (Patient taking differently: 1 po 30 MINS PRIOR TO BREAKFAST) 180 tablet 3  . PROAIR HFA 108 (90 BASE) MCG/ACT inhaler Inhale 2 puffs into the lungs every 4 (four) hours as needed.  0  . promethazine (PHENERGAN) 25 MG tablet Take 25 mg by mouth every 6 (six) hours as needed for nausea.    Marland Kitchen azithromycin (ZITHROMAX) 250 MG tablet Take 1 tablet (250 mg total) by mouth daily. Take first 2 tablets together, then 1 every day until finished.  6 tablet 0  . ondansetron (ZOFRAN) 4 MG tablet Take 1 tablet (4 mg total) by mouth every 8 (eight) hours as needed for nausea or vomiting. 20 tablet 12  . predniSONE (DELTASONE) 10 MG tablet Take 3 tablets (30 mg total) by mouth daily with breakfast. 15 tablet 0   No current facility-administered medications for this visit.    Allergies  Allergen Reactions  . Penicillins     REACTION: hives  . Prednisone Hives  . Latex Rash    Health Maintenance Health Maintenance  Topic Date Due  . HIV Screening  12/16/1988  . TETANUS/TDAP  12/16/1992  . INFLUENZA VACCINE  09/17/2016  . PAP SMEAR  03/26/2019     Exam:  BP 136/71   Pulse 67   Temp 98.7 F (37.1 C)   Ht 5\' 6"  (1.676 m)   Wt 262 lb (118.8 kg)   BMI 42.29 kg/m  Gen: Well NAD HEENT: EOMI,  MMM Clear nasal discharge. Posterior pharynx with cobblestoning. Mild erythema posterior pharynx maxillary and frontal sinuses are nontender. Lungs: Normal work of breathing. CTABL Heart: RRR no MRG Abd: NABS, Soft. Nondistended, Nontender Exts: Brisk capillary refill, warm and well perfused.  MSK: Feet bilaterally normal-appearing. Mildly tender palpation right plantar calcaneus and along the arch of the left foot. Normal motion pulses capillary refill and sensation. Psych: Alert and oriented normal speech the process and affect.   No results found for this or any previous visit (from the past 72 hour(s)). No results found.    Assessment and Plan: 41 y.o. female with  Sore throat likely viral. Continue over-the-counter medications. We'll prescribe backup Zofran prednisone and azithromycin if symptoms worsen while on vacation.  Foot pain: Planter fasciitis. Work on eccentric exercises and ice massage. Recheck in 6 weeks.  Anxiety: Situational. Continue intermittent seen next as needed. Recheck in 6 weeks if worsening will start SSRI therapy.  Hypothyroidism: We'll check TSH as well as other basic fasting labs.   Orders Placed  This Encounter  Procedures  . CBC  . COMPLETE METABOLIC PANEL WITH GFR  . Lipid Panel w/reflex Direct LDL  . T4, free  . T3, free  . TSH  . Hemoglobin A1c   Meds ordered this encounter  Medications  . ALPRAZolam (XANAX) 0.25 MG tablet    Sig: Take 1 tablet (0.25 mg total) by mouth 2 (two) times daily as needed for anxiety.    Dispense:  60 tablet    Refill:  1  . ondansetron (ZOFRAN) 4 MG tablet    Sig: Take 1 tablet (4 mg total) by mouth every 8 (eight) hours as needed for nausea or vomiting.    Dispense:  20 tablet    Refill:  12  . predniSONE (DELTASONE) 10 MG tablet    Sig: Take 3 tablets (30 mg total) by mouth daily with breakfast.    Dispense:  15 tablet    Refill:  0  . azithromycin (ZITHROMAX) 250 MG tablet  Sig: Take 1 tablet (250 mg total) by mouth daily. Take first 2 tablets together, then 1 every day until finished.    Dispense:  6 tablet    Refill:  0     Discussed warning signs or symptoms. Please see discharge instructions. Patient expresses understanding.

## 2016-09-03 LAB — HEMOGLOBIN A1C
Hgb A1c MFr Bld: 5.1 % (ref <5.7)
MEAN PLASMA GLUCOSE: 100 mg/dL

## 2016-09-09 DIAGNOSIS — M79669 Pain in unspecified lower leg: Secondary | ICD-10-CM | POA: Diagnosis not present

## 2016-09-15 ENCOUNTER — Encounter: Payer: Self-pay | Admitting: Family Medicine

## 2016-10-14 ENCOUNTER — Ambulatory Visit (INDEPENDENT_AMBULATORY_CARE_PROVIDER_SITE_OTHER): Payer: 59 | Admitting: Family Medicine

## 2016-10-14 VITALS — BP 122/72 | HR 92 | Ht 67.0 in | Wt 268.0 lb

## 2016-10-14 DIAGNOSIS — E039 Hypothyroidism, unspecified: Secondary | ICD-10-CM | POA: Diagnosis not present

## 2016-10-14 DIAGNOSIS — F411 Generalized anxiety disorder: Secondary | ICD-10-CM | POA: Diagnosis not present

## 2016-10-14 DIAGNOSIS — M722 Plantar fascial fibromatosis: Secondary | ICD-10-CM

## 2016-10-14 NOTE — Patient Instructions (Addendum)
Thank you for coming in today.  We will recheck Thyroid.  I will get results to you ASAP.   Otherwise we can recheck every 6 months.   Return sooner if needed.

## 2016-10-14 NOTE — Progress Notes (Signed)
Barbara [name] is a 41 y.o. female who presents to [name] today for follow-up foot pain anxiety and thyroid disease.  Foot pain: Patient was seen about a month ago where she was diagnosed with plantar fasciitis. She has been doing home exercise program and notes almost complete resolution pain. She's feeling well.  Anxiety: Patient is mild anxiety. She uses Xanax very infrequently. Since the last visit she has taking 2 Xanax pills. She has this allows her to function more reliably.   Thyroid disease: Patient is mild hypothyroidism. She takes medication listed below. She had a thyroid labs checked at the last visit and found to have mildly elevated free T3. I asked her to decrease to 3 tablets per day. She has done so and feels fine.  Past Medical History:  Diagnosis Date  . Anxiety   . Complication of anesthesia   . Depression   . Migraine headache   . Palpitations   . Panic attacks   . PONV (postoperative nausea and vomiting)   . Thyroid disease   . Unspecified symptom associated with female genital organs 12/22/2012   Past Surgical History:  Procedure Laterality Date  . ELBOW SURGERY Left   . ENDOMETRIAL ABLATION    . ESOPHAGOGASTRODUODENOSCOPY N/A 05/18/2014   SLF: 1. Mild esophaigitis. 2. Mild non-erosive gastritis.   Marland Kitchen FRACTURE SURGERY     left distal radius with internal fixation 11/01/2006   Social History  Substance Use Topics  . Smoking status: Never Smoker  . Smokeless tobacco: Never Used  . Alcohol use No   family history includes Diabetes in her father and paternal grandmother; Hyperlipidemia in her father and paternal grandmother; Hypertension in her father and paternal grandmother.  ROS as above:  Medications: Current Outpatient Prescriptions  Medication Sig Dispense Refill  . ALPRAZolam (XANAX) 0.25 MG tablet Take 1 tablet (0.25 mg  total) by mouth 2 (two) times daily as needed for anxiety. 60 tablet 1  . cetirizine (ZYRTEC) 10 MG tablet Take 10 mg by mouth daily.    . Cholecalciferol (VITAMIN D3 PO) Take 1,000 Int'l Units by mouth daily.     . Cyanocobalamin (VITAMIN B-12 PO) Take 1,000 mg by mouth daily.     Marland Kitchen eletriptan (RELPAX) 40 MG tablet One tablet by mouth at onset of headache. May repeat in 2 hours if headache persists or recurs. may repeat in 2 hours if necessary    . liothyronine (CYTOMEL) 5 MCG tablet Takes 4 tabs daily in the am.    . ondansetron (ZOFRAN) 4 MG tablet Take 1 tablet (4 mg total) by mouth every 8 (eight) hours as needed for nausea or vomiting. 20 tablet 12  . pantoprazole (PROTONIX) 40 MG tablet 1 po 30 MINS PRIOR TO BREAKFAST AND SUPPER. (Patient taking differently: 1 po 30 MINS PRIOR TO BREAKFAST) 180 tablet 3  . PROAIR HFA 108 (90 BASE) MCG/ACT inhaler Inhale 2 puffs into the lungs every 4 (four) hours as needed.  0  . promethazine (PHENERGAN) 25 MG tablet Take 25 mg by mouth every 6 (six) hours as needed for nausea.     No current facility-administered medications for this visit.    Allergies  Allergen Reactions  . Penicillins     REACTION: hives  . Prednisone Hives  . Latex Rash    Health Maintenance Health Maintenance  Topic Date Due  . HIV Screening  12/16/1988  . TETANUS/TDAP  12/16/1992  .  INFLUENZA VACCINE  10/18/2017 (Originally 09/17/2016)  . PAP SMEAR  03/26/2019     Exam:  BP 122/72 (BP Location: Right Arm, Patient Position: Sitting, Cuff Size: Normal)   Pulse 92   Ht 5\' 7"  (1.702 m)   Wt 268 lb (121.6 kg)   SpO2 99%   BMI 41.97 kg/m   Wt Readings from Last 10 Encounters:  10/14/16 268 lb (121.6 kg)  09/02/16 262 lb (118.8 kg)  03/25/16 254 lb 8 oz (115.4 kg)  11/17/14 276 lb 8 oz (125.4 kg)  05/18/14 264 lb (119.7 kg)  05/04/14 264 lb 3.2 oz (119.8 kg)  04/16/14 255 lb (115.7 kg)  12/29/12 204 lb (92.5 kg)  12/22/12 253 lb (114.8 kg)  07/23/12 250 lb (113.4  kg)    Gen: Well NAD HEENT: EOMI,  MMM No goiter Lungs: Normal work of breathing. CTABL Heart: RRR no MRG Abd: NABS, Soft. Nondistended, Nontender Exts: Brisk capillary refill, warm and well perfused.    No results found for this or any previous visit (from the past 72 hour(s)). No results found.    Assessment and Plan: 41 y.o. female with  Hypothyroid: Patient with decreased dose. Plan to check TSH free T3 and free T4.  Plantar fasciitis improved. Continue watchful waiting and home exercise program.  Anxiety doing well. Continue current regimen.  Recheck every 6 months.   Orders Placed This Encounter  Procedures  . T3, free  . T4, free  . TSH   No orders of the defined types were placed in this encounter.    Discussed warning signs or symptoms. Please see discharge instructions. Patient expresses understanding.

## 2016-10-15 LAB — T4, FREE: FREE T4: 0.8 ng/dL (ref 0.8–1.8)

## 2016-10-15 LAB — T3, FREE: T3 FREE: 4.5 pg/mL — AB (ref 2.3–4.2)

## 2016-10-15 LAB — TSH: TSH: 1.93 mIU/L

## 2016-10-15 MED ORDER — LIOTHYRONINE SODIUM 5 MCG PO TABS: 15.0000 ug | ORAL_TABLET | Freq: Every day | ORAL | 1 refills | Status: DC

## 2016-10-15 NOTE — Addendum Note (Signed)
Addended by: Rodolph BongOREY, Shunna Mikaelian S on: 10/15/2016 07:31 AM   Modules accepted: Orders

## 2016-11-06 DIAGNOSIS — Z1231 Encounter for screening mammogram for malignant neoplasm of breast: Secondary | ICD-10-CM | POA: Diagnosis not present

## 2016-11-18 ENCOUNTER — Ambulatory Visit (INDEPENDENT_AMBULATORY_CARE_PROVIDER_SITE_OTHER): Payer: 59 | Admitting: Physician Assistant

## 2016-11-18 ENCOUNTER — Encounter: Payer: Self-pay | Admitting: Physician Assistant

## 2016-11-18 VITALS — BP 133/89 | HR 92 | Wt 272.0 lb

## 2016-11-18 DIAGNOSIS — G43109 Migraine with aura, not intractable, without status migrainosus: Secondary | ICD-10-CM

## 2016-11-18 MED ORDER — KETOROLAC TROMETHAMINE 30 MG/ML IJ SOLN
30.0000 mg | Freq: Once | INTRAMUSCULAR | Status: AC
  Administered 2016-11-18: 30 mg via INTRAMUSCULAR

## 2016-11-18 MED ORDER — DEXAMETHASONE SODIUM PHOSPHATE 4 MG/ML IJ SOLN
4.0000 mg | Freq: Once | INTRAMUSCULAR | Status: AC
  Administered 2016-11-18: 4 mg via INTRAMUSCULAR

## 2016-11-18 MED ORDER — PROMETHAZINE HCL 12.5 MG PO TABS
12.5000 mg | ORAL_TABLET | Freq: Once | ORAL | Status: AC
  Administered 2016-11-18: 12.5 mg via ORAL

## 2016-11-18 NOTE — Progress Notes (Signed)
HPI:                                                                Barbara [name] is a 41 y.o. female who presents to [name]: [facility] today for acute migraine  This pleasant 41 yo F with PMH of migraines, thyroid disease, anxiety presents today with a headache. Reports headache is consistent with her usual migraine symptoms. Onset midnight last night. Location is frontal, bilateral, describes headache as throbbing. Associated blurred vision, nausea and photophobia. No paresthesias, focal weakness or loss of consciousness. She took her Relpax twice and initially felt improved, but headache returned.   She brought FMLA papers for renewal for intermittent leave.  Past Medical History:  Diagnosis Date  . Anxiety   . Complication of anesthesia   . Depression   . Migraine headache   . Palpitations   . Panic attacks   . PONV (postoperative nausea and vomiting)   . Thyroid disease   . Unspecified symptom associated with female genital organs 12/22/2012   Past Surgical History:  Procedure Laterality Date  . ELBOW SURGERY Left   . ENDOMETRIAL ABLATION    . ESOPHAGOGASTRODUODENOSCOPY N/A 05/18/2014   SLF: 1. Mild esophaigitis. 2. Mild non-erosive gastritis.   Marland Kitchen FRACTURE SURGERY     left distal radius with internal fixation 11/01/2006   Social History  Substance Use Topics  . Smoking status: Never Smoker  . Smokeless tobacco: Never Used  . Alcohol use No   family history includes Diabetes in her father and paternal grandmother; Hyperlipidemia in her father and paternal grandmother; Hypertension in her father and paternal grandmother.  ROS: negative except as noted in the HPI  Medications: Current Outpatient Prescriptions  Medication Sig Dispense Refill  . ALPRAZolam (XANAX) 0.25 MG tablet Take 1 tablet (0.25 mg total) by mouth 2 (two) times daily as needed for anxiety. 60 tablet 1  . cetirizine (ZYRTEC) 10 MG tablet Take 10 mg by mouth  daily.    . Cholecalciferol (VITAMIN D3 PO) Take 1,000 Int'l Units by mouth daily.     . Cyanocobalamin (VITAMIN B-12 PO) Take 1,000 mg by mouth daily.     Marland Kitchen eletriptan (RELPAX) 40 MG tablet One tablet by mouth at onset of headache. May repeat in 2 hours if headache persists or recurs. may repeat in 2 hours if necessary    . liothyronine (CYTOMEL) 5 MCG tablet Take 3 tablets (15 mcg total) by mouth daily. 270 tablet 1  . ondansetron (ZOFRAN) 4 MG tablet Take 1 tablet (4 mg total) by mouth every 8 (eight) hours as needed for nausea or vomiting. 20 tablet 12  . pantoprazole (PROTONIX) 40 MG tablet 1 po 30 MINS PRIOR TO BREAKFAST AND SUPPER. (Patient taking differently: 1 po 30 MINS PRIOR TO BREAKFAST) 180 tablet 3  . PROAIR HFA 108 (90 BASE) MCG/ACT inhaler Inhale 2 puffs into the lungs every 4 (four) hours as needed.  0  . promethazine (PHENERGAN) 25 MG tablet Take 25 mg by mouth every 6 (six) hours as needed for nausea.     No current facility-administered medications for this visit.    Allergies  Allergen Reactions  . Penicillins     REACTION: hives  . Prednisone Hives  .  Latex Rash       Objective:  BP 133/89   Pulse 92   Wt 272 lb (123.4 kg)   BMI 42.60 kg/m  Gen: well-groomed, alert, appears uncomfortable, not ill-appearing, no acute distress HEENT: head normocephalic, atraumatic; conjunctiva and cornea clear, oropharynx clear Pulm: Normal work of breathing, normal phonation Neuro:  cranial nerves II-XII intact, no nystagmus, normal finger-to-nose, normal heel-to-shin, negative pronator drift, normal coordination, DTR's intact, normal tone, no tremor MSK: strength 5/5 and symmetric in bilateral upper and lower extremities, normal gait and station Mental Status: alert and oriented x 3, speech articulate, and thought processes clear and goal-directed   No results found for this or any previous visit (from the past 72 hour(s)). No results found.    Assessment and Plan: 41  y.o. female with   1. Migraine with aura and without status migrainosus, not intractable - reassuring neuro exam, no focal deficits - abortive treatment of Decadron , Toradol  and Phenergan 12.5mg  given in office today. Patient feels improved and okay to go home - patient declines refills of Phenergan and Relpax - FMLA papers left in PCP inbasket  Patient education and anticipatory guidance given Patient agrees with treatment plan Follow-up with PCP in 1 week or sooner as needed if symptoms worsen or fail to improve  Levonne Hubert PA-C

## 2016-11-18 NOTE — Patient Instructions (Signed)

## 2016-11-25 ENCOUNTER — Ambulatory Visit (INDEPENDENT_AMBULATORY_CARE_PROVIDER_SITE_OTHER): Payer: 59 | Admitting: Family Medicine

## 2016-11-25 VITALS — BP 104/71 | HR 79

## 2016-11-25 DIAGNOSIS — G43909 Migraine, unspecified, not intractable, without status migrainosus: Secondary | ICD-10-CM | POA: Diagnosis not present

## 2016-11-25 DIAGNOSIS — F4321 Adjustment disorder with depressed mood: Secondary | ICD-10-CM | POA: Insufficient documentation

## 2016-11-25 NOTE — Progress Notes (Signed)
Barbara [name] is a 41 y.o. female who presents to [name]: [facility] today for follow-up migraines and discuss grief.  Laurella notes history of migraines. She typically takes Relpax occasionally for migraines a few times per month. She typically does not have a migraine however. She thinks things are going pretty well and she is satisfied with her current level of care.  Salwa does however note grief. Her niece is currently dying from cancer. She's 43 year old and this is deeply unfair. The niece is currently receiving hospice and will probably die so according to her report. She's not quite sure how she is going to handle it but is interested in counseling either through hospice her work. She feels feelings of grief and anger and confusion and sadness. She notes that she is able to sleep and is able to work through her feelings by thinking about it, talking about it, and doing a bit of exercise.   Past Medical History:  Diagnosis Date  . Anxiety   . Complication of anesthesia   . Depression   . Migraine headache   . Palpitations   . Panic attacks   . PONV (postoperative nausea and vomiting)   . Thyroid disease   . Unspecified symptom associated with female genital organs 12/22/2012   Past Surgical History:  Procedure Laterality Date  . ELBOW SURGERY Left   . ENDOMETRIAL ABLATION    . ESOPHAGOGASTRODUODENOSCOPY N/A 05/18/2014   SLF: 1. Mild esophaigitis. 2. Mild non-erosive gastritis.   Marland Kitchen FRACTURE SURGERY     left distal radius with internal fixation 11/01/2006   Social History  Substance Use Topics  . Smoking status: Never Smoker  . Smokeless tobacco: Never Used  . Alcohol use No   family history includes Diabetes in her father and paternal grandmother; Hyperlipidemia in her father and paternal grandmother; Hypertension in her father and paternal grandmother.  ROS as  above:  Medications: Current Outpatient Prescriptions  Medication Sig Dispense Refill  . ALPRAZolam (XANAX) 0.25 MG tablet Take 1 tablet (0.25 mg total) by mouth 2 (two) times daily as needed for anxiety. 60 tablet 1  . cetirizine (ZYRTEC) 10 MG tablet Take 10 mg by mouth daily.    . Cholecalciferol (VITAMIN D3 PO) Take 1,000 Int'l Units by mouth daily.     . Cyanocobalamin (VITAMIN B-12 PO) Take 1,000 mg by mouth daily.     Marland Kitchen eletriptan (RELPAX) 40 MG tablet One tablet by mouth at onset of headache. May repeat in 2 hours if headache persists or recurs. may repeat in 2 hours if necessary    . liothyronine (CYTOMEL) 5 MCG tablet Take 3 tablets (15 mcg total) by mouth daily. 270 tablet 1  . ondansetron (ZOFRAN) 4 MG tablet Take 1 tablet (4 mg total) by mouth every 8 (eight) hours as needed for nausea or vomiting. 20 tablet 12  . pantoprazole (PROTONIX) 40 MG tablet 1 po 30 MINS PRIOR TO BREAKFAST AND SUPPER. (Patient taking differently: 1 po 30 MINS PRIOR TO BREAKFAST) 180 tablet 3  . PROAIR HFA 108 (90 BASE) MCG/ACT inhaler Inhale 2 puffs into the lungs every 4 (four) hours as needed.  0  . promethazine (PHENERGAN) 25 MG tablet Take 25 mg by mouth every 6 (six) hours as needed for nausea.     No current facility-administered medications for this visit.    Allergies  Allergen Reactions  . Penicillins     REACTION:  hives  . Prednisone Hives  . Latex Rash    Health Maintenance Health Maintenance  Topic Date Due  . HIV Screening  12/16/1988  . TETANUS/TDAP  12/16/1992  . PAP SMEAR  03/26/2019  . INFLUENZA VACCINE  Addressed     Exam:  BP 104/71   Pulse 79  Gen: Well NAD HEENT: EOMI,  MMM Lungs: Normal work of breathing. CTABL Heart: RRR no MRG Abd: NABS, Soft. Nondistended, Nontender Exts: Brisk capillary refill, warm and well perfused.  Psych alert and oriented normal speech. Thought process is tearful at times. No SI or HI expressed.   No results found for this or any  previous visit (from the past 72 hour(s)). No results found.    Assessment and Plan: 41 y.o. female with  Migraine headaches: Doing pretty well. We'll refill Relpax as needed. We discussed migraine prophylaxis as well. We'll hold off on prescribing anything currently.  Grief: We had a lengthy discussion about grief and treatment options. I'm available for help if her symptoms worsen in the meantime. Otherwise recommend counseling and watchful waiting.   No orders of the defined types were placed in this encounter.  No orders of the defined types were placed in this encounter.    Discussed warning signs or symptoms. Please see discharge instructions. Patient expresses understanding.  I spent 25 minutes with this patient, greater than 50% was face-to-face time counseling regarding grief.

## 2016-11-25 NOTE — Patient Instructions (Addendum)
Thank you for coming in today. We may consider Topamax for migraine prevention.  Let me know if you still have migraines more than 3-4 x a month.   I do recommend some grief therapy. Let me know if you having problems.    Topiramate tablets What is this medicine? TOPIRAMATE (toe PYRE a mate) is used to treat seizures in adults or children with epilepsy. It is also used for the prevention of migraine headaches. This medicine may be used for other purposes; ask your health care provider or pharmacist if you have questions. COMMON BRAND NAME(S): Topamax, Topiragen What should I tell my health care provider before I take this medicine? They need to know if you have any of these conditions: -bleeding disorders -cirrhosis of the liver or liver disease -diarrhea -glaucoma -kidney stones or kidney disease -low blood counts, like low white cell, platelet, or red cell counts -lung disease like asthma, obstructive pulmonary disease, emphysema -metabolic acidosis -on a ketogenic diet -schedule for surgery or a procedure -suicidal thoughts, plans, or attempt; a previous suicide attempt by you or a family member -an unusual or allergic reaction to topiramate, other medicines, foods, dyes, or preservatives -pregnant or trying to get pregnant -breast-feeding How should I use this medicine? Take this medicine by mouth with a glass of water. Follow the directions on the prescription label. Do not crush or chew. You may take this medicine with meals. Take your medicine at regular intervals. Do not take it more often than directed. Talk to your pediatrician regarding the use of this medicine in children. Special care may be needed. While this drug may be prescribed for children as young as 57 years of age for selected conditions, precautions do apply. Overdosage: If you think you have taken too much of this medicine contact a poison control center or emergency room at once. NOTE: This medicine is only  for you. Do not share this medicine with others. What if I miss a dose? If you miss a dose, take it as soon as you can. If your next dose is to be taken in less than 6 hours, then do not take the missed dose. Take the next dose at your regular time. Do not take double or extra doses. What may interact with this medicine? Do not take this medicine with any of the following medications: -probenecid This medicine may also interact with the following medications: -acetazolamide -alcohol -amitriptyline -aspirin and aspirin-like medicines -birth control pills -certain medicines for depression -certain medicines for seizures -certain medicines that treat or prevent blood clots like warfarin, enoxaparin, dalteparin, apixaban, dabigatran, and rivaroxaban -digoxin -hydrochlorothiazide -lithium -medicines for pain, sleep, or muscle relaxation -metformin -methazolamide -NSAIDS, medicines for pain and inflammation, like ibuprofen or naproxen -pioglitazone -risperidone This list may not describe all possible interactions. Give your health care provider a list of all the medicines, herbs, non-prescription drugs, or dietary supplements you use. Also tell them if you smoke, drink alcohol, or use illegal drugs. Some items may interact with your medicine. What should I watch for while using this medicine? Visit your doctor or health care professional for regular checks on your progress. Do not stop taking this medicine suddenly. This increases the risk of seizures if you are using this medicine to control epilepsy. Wear a medical identification bracelet or chain to say you have epilepsy or seizures, and carry a card that lists all your medicines. This medicine can decrease sweating and increase your body temperature. Watch for signs of deceased sweating  or fever, especially in children. Avoid extreme heat, hot baths, and saunas. Be careful about exercising, especially in hot weather. Contact your health care  provider right away if you notice a fever or decrease in sweating. You should drink plenty of fluids while taking this medicine. If you have had kidney stones in the past, this will help to reduce your chances of forming kidney stones. If you have stomach pain, with nausea or vomiting and yellowing of your eyes or skin, call your doctor immediately. You may get drowsy, dizzy, or have blurred vision. Do not drive, use machinery, or do anything that needs mental alertness until you know how this medicine affects you. To reduce dizziness, do not sit or stand up quickly, especially if you are an older patient. Alcohol can increase drowsiness and dizziness. Avoid alcoholic drinks. If you notice blurred vision, eye pain, or other eye problems, seek medical attention at once for an eye exam. The use of this medicine may increase the chance of suicidal thoughts or actions. Pay special attention to how you are responding while on this medicine. Any worsening of mood, or thoughts of suicide or dying should be reported to your health care professional right away. This medicine may increase the chance of developing metabolic acidosis. If left untreated, this can cause kidney stones, bone disease, or slowed growth in children. Symptoms include breathing fast, fatigue, loss of appetite, irregular heartbeat, or loss of consciousness. Call your doctor immediately if you experience any of these side effects. Also, tell your doctor about any surgery you plan on having while taking this medicine since this may increase your risk for metabolic acidosis. Birth control pills may not work properly while you are taking this medicine. Talk to your doctor about using an extra method of birth control. Women who become pregnant while using this medicine may enroll in the [place] American Antiepileptic Drug Pregnancy Registry by calling [phone]. This registry collects information about the safety of antiepileptic drug use during  pregnancy. What side effects may I notice from receiving this medicine? Side effects that you should report to your doctor or health care professional as soon as possible: -allergic reactions like skin rash, itching or hives, swelling of the face, lips, or tongue -decreased sweating and/or rise in body temperature -depression -difficulty breathing, fast or irregular breathing patterns -difficulty speaking -difficulty walking or controlling muscle movements -hearing impairment -redness, blistering, peeling or loosening of the skin, including inside the mouth -tingling, pain or numbness in the hands or feet -unusual bleeding or bruising -unusually weak or tired -worsening of mood, thoughts or actions of suicide or dying Side effects that usually do not require medical attention (report to your doctor or health care professional if they continue or are bothersome): -altered taste -back pain, joint or muscle aches and pains -diarrhea, or constipation -headache -loss of appetite -nausea -stomach upset, indigestion -tremors This list may not describe all possible side effects. Call your doctor for medical advice about side effects. You may report side effects to FDA at 1-800-FDA-1088. Where should I keep my medicine? Keep out of the reach of children. Store at room temperature between 15 and 30 degrees C (59 and 86 degrees F) in a tightly closed container. Protect from moisture. Throw away any unused medicine after the expiration date. NOTE: This sheet is a summary. It may not cover all possible information. If you have questions about this medicine, talk to your doctor, pharmacist, or health care provider.  2018 Elsevier/Gold Standard (2013-02-07  23:17:57)  

## 2016-12-05 DIAGNOSIS — M79669 Pain in unspecified lower leg: Secondary | ICD-10-CM | POA: Diagnosis not present

## 2017-02-17 ENCOUNTER — Encounter: Payer: Self-pay | Admitting: Adult Health

## 2017-02-21 ENCOUNTER — Encounter (HOSPITAL_BASED_OUTPATIENT_CLINIC_OR_DEPARTMENT_OTHER): Payer: Self-pay | Admitting: Emergency Medicine

## 2017-02-21 ENCOUNTER — Other Ambulatory Visit: Payer: Self-pay

## 2017-02-21 ENCOUNTER — Emergency Department (HOSPITAL_BASED_OUTPATIENT_CLINIC_OR_DEPARTMENT_OTHER)
Admission: EM | Admit: 2017-02-21 | Discharge: 2017-02-21 | Disposition: A | Discharge status: Home / Self Care | Payer: Worker's Compensation | Attending: Emergency Medicine | Admitting: Emergency Medicine

## 2017-02-21 ENCOUNTER — Emergency Department (HOSPITAL_BASED_OUTPATIENT_CLINIC_OR_DEPARTMENT_OTHER): Payer: Worker's Compensation

## 2017-02-21 DIAGNOSIS — Z91040 Latex allergy status: Secondary | ICD-10-CM | POA: Insufficient documentation

## 2017-02-21 DIAGNOSIS — Y9389 Activity, other specified: Secondary | ICD-10-CM | POA: Diagnosis not present

## 2017-02-21 DIAGNOSIS — X509XXA Other and unspecified overexertion or strenuous movements or postures, initial encounter: Secondary | ICD-10-CM | POA: Insufficient documentation

## 2017-02-21 DIAGNOSIS — M25532 Pain in left wrist: Secondary | ICD-10-CM | POA: Diagnosis present

## 2017-02-21 DIAGNOSIS — Y929 Unspecified place or not applicable: Secondary | ICD-10-CM | POA: Diagnosis not present

## 2017-02-21 DIAGNOSIS — Z79899 Other long term (current) drug therapy: Secondary | ICD-10-CM | POA: Insufficient documentation

## 2017-02-21 DIAGNOSIS — E039 Hypothyroidism, unspecified: Secondary | ICD-10-CM | POA: Insufficient documentation

## 2017-02-21 DIAGNOSIS — Y999 Unspecified external cause status: Secondary | ICD-10-CM | POA: Insufficient documentation

## 2017-02-21 MED ORDER — ACETAMINOPHEN 325 MG PO TABS
650.0000 mg | ORAL_TABLET | Freq: Once | ORAL | Status: AC
  Administered 2017-02-21: 650 mg via ORAL
  Filled 2017-02-21: qty 2

## 2017-02-21 MED ORDER — IBUPROFEN 800 MG PO TABS
800.0000 mg | ORAL_TABLET | Freq: Once | ORAL | Status: AC
  Administered 2017-02-21: 800 mg via ORAL
  Filled 2017-02-21: qty 1

## 2017-02-21 MED ORDER — IBUPROFEN 800 MG PO TABS: 800.0000 mg | ORAL_TABLET | Freq: Three times a day (TID) | ORAL | 0 refills | Status: DC | PRN

## 2017-02-21 NOTE — ED Notes (Signed)
Patient given ice pack to applied to left wrist.

## 2017-02-21 NOTE — ED Notes (Signed)
Pt given d/c instructions as per chart. Rx x 1. Verbalizes understanding. No questions. 

## 2017-02-21 NOTE — ED Provider Notes (Signed)
[facility] EMERGENCY DEPARTMENT Provider Note   CSN: [id number] Arrival date & time: 02/21/17  1716     History   Chief Complaint Chief Complaint  Patient presents with  . Wrist Pain    HPI BIRGIT NOWLING is a 42 y.o. female.  The history is provided by the patient. No language interpreter was used.   BREIONNA PUNT is a 42 y.o. female who presents to the Emergency Department complaining of left wrist pain.  She was at work yesterday trying to take off a large nut off a machine when she felt a pop in her forearm and sudden onset of tingling in her fourth and fifth digits and pain radiating up to her elbow.  Tingling and pain are constant in nature.  She has a history of prior surgery on her left forearm and elbow.  She is left-hand dominant.  No additional injuries.  Past Medical History:  Diagnosis Date  . Anxiety   . Complication of anesthesia   . Depression   . Migraine headache   . Palpitations   . Panic attacks   . PONV (postoperative nausea and vomiting)   . Thyroid disease   . Unspecified symptom associated with female genital organs 12/22/2012    Patient Active Problem List   Diagnosis Date Noted  . Grief 11/25/2016  . Hypothyroid 09/02/2016  . Screening for colorectal cancer 03/25/2016  . Encounter for gynecological examination with Papanicolaou smear of cervix 03/25/2016  . Dyspepsia   . Unspecified symptom associated with female genital organs 12/22/2012  . Morbid obesity (HCC) 10/29/2009  . Anxiety state 10/26/2009  . Migraine headache 10/26/2009  . PALPITATIONS 10/26/2009    Past Surgical History:  Procedure Laterality Date  . ELBOW SURGERY Left   . ENDOMETRIAL ABLATION    . ESOPHAGOGASTRODUODENOSCOPY N/A 05/18/2014   SLF: 1. Mild esophaigitis. 2. Mild non-erosive gastritis.   Marland Kitchen FRACTURE SURGERY     left distal radius with internal fixation 11/01/2006    OB History    Gravida Para Term Preterm AB Living   2 2 2     2    SAB TAB  Ectopic Multiple Live Births           2       Home Medications    Prior to Admission medications   Medication Sig Start Date End Date Taking? Authorizing Provider  ALPRAZolam (XANAX) 0.25 MG tablet Take 1 tablet (0.25 mg total) by mouth 2 (two) times daily as needed for anxiety. 09/02/16   Rodolph Bong, MD  cetirizine (ZYRTEC) 10 MG tablet Take 10 mg by mouth daily.    [provider]  Cholecalciferol (VITAMIN D3 PO) Take 1,000 Int'l Units by mouth daily.     [provider]  Cyanocobalamin (VITAMIN B-12 PO) Take 1,000 mg by mouth daily.     [provider]  eletriptan (RELPAX) 40 MG tablet One tablet by mouth at onset of headache. May repeat in 2 hours if headache persists or recurs. may repeat in 2 hours if necessary    [provider]  ibuprofen (ADVIL,MOTRIN) 800 MG tablet Take 1 tablet (800 mg total) by mouth every 8 (eight) hours as needed for moderate pain. 02/21/17   Tilden Fossa, MD  liothyronine (CYTOMEL) 5 MCG tablet Take 3 tablets (15 mcg total) by mouth daily. 10/15/16   Rodolph Bong, MD  ondansetron (ZOFRAN) 4 MG tablet Take 1 tablet (4 mg total) by mouth every 8 (eight) hours  as needed for nausea or vomiting. 09/02/16   Rodolph Bongorey, Evan S, MD  pantoprazole (PROTONIX) 40 MG tablet 1 po 30 MINS PRIOR TO BREAKFAST AND SUPPER. Patient taking differently: 1 po 30 MINS PRIOR TO BREAKFAST 10/11/15   Tiffany KocherLewis, Leslie S, PA-C  PROAIR HFA 108 (90 BASE) MCG/ACT inhaler Inhale 2 puffs into the lungs every 4 (four) hours as needed. 01/17/14   [provider]  promethazine (PHENERGAN) 25 MG tablet Take 25 mg by mouth every 6 (six) hours as needed for nausea.    [provider]    Family History Family History  Problem Relation Age of Onset  . Hypertension Father   . Hyperlipidemia Father   . Diabetes Father   . Diabetes Paternal Grandmother   . Hyperlipidemia Paternal Grandmother   . Hypertension Paternal Grandmother   . Colon cancer Neg  Hx     Social History Social History   Tobacco Use  . Smoking status: Never Smoker  . Smokeless tobacco: Never Used  Substance Use Topics  . Alcohol use: No  . Drug use: No     Allergies   Penicillins; Prednisone; and Latex   Review of Systems Review of Systems  All other systems reviewed and are negative.    Physical Exam Updated Vital Signs BP (!) 133/110 (BP Location: Right Arm)   Pulse 76   Temp 97.9 F (36.6 C) (Oral)   Resp 18   Ht 5\' 6"  (1.676 m)   Wt 123.4 kg (272 lb)   SpO2 99%   BMI 43.90 kg/m   Physical Exam  Constitutional: She is oriented to person, place, and time. She appears well-developed and well-nourished.  HENT:  Head: Normocephalic and atraumatic.  Cardiovascular: Normal rate and regular rhythm.  Pulmonary/Chest: Effort normal. No respiratory distress.  Musculoskeletal:  2+ radial pulses bilaterally.  Full range of motion is present throughout the left hand, wrist, elbow.  5 out of 5 grip strength, wrist flexion extension as well as elbow flexion extension.  Sensation to light touch intact throughout all the digits.  Well-healed surgical scar to the volar wrist as well as the elbow.  No palpable masses or deformities.  Neurological: She is alert and oriented to person, place, and time.  Skin: Skin is warm and dry. Capillary refill takes less than 2 seconds.  Psychiatric: She has a normal mood and affect. Her behavior is normal.  Nursing note and vitals reviewed.    ED Treatments / Results  Labs (all labs ordered are listed, but only abnormal results are displayed) Labs Reviewed - No data to display  EKG  EKG Interpretation None       Radiology Dg Wrist Complete Left  Result Date: 02/21/2017 CLINICAL DATA:  Wrist injury at work, felt something pop wall twisting something, prior fracture 2008 chronic anterior posterior pain EXAM: LEFT WRIST - COMPLETE 3+ VIEW COMPARISON:  12/02/2012 FINDINGS: Osseous demineralization. Deformity of  the distal LEFT radius from old healed fracture. Minimal chronic subchondral lucency at the distal radius adjacent to the radiocarpal joint. No acute fracture, dislocation, or bone destruction. IMPRESSION: Old posttraumatic deformity of the distal LEFT radius. No acute osseous abnormalities. Electronically Signed   By: Ulyses SouthwardMark  Boles M.D.   On: 02/21/2017 18:33    Procedures Procedures (including critical care time)  Medications Ordered in ED Medications  ibuprofen (ADVIL,MOTRIN) tablet 800 mg (800 mg Oral Given 02/21/17 2226)  acetaminophen (TYLENOL) tablet 650 mg (650 mg Oral Given 02/21/17 2226)  Initial Impression / Assessment and Plan / ED Course  I have reviewed the triage vital signs and the nursing notes.  Pertinent labs & imaging results that were available during my care of the patient were reviewed by me and considered in my medical decision making (see chart for details).     Patient here for evaluation of pain and tingling to the left hand and forearm after an injury yesterday.  She is neurovascularly intact on examination with good range of motion throughout the left upper extremity.  Will place in splint and sling for comfort with ibuprofen for pain.  Counseled patient on orthopedics follow-up and home care.  Final Clinical Impressions(s) / ED Diagnoses   Final diagnoses:  Acute pain of left wrist    ED Discharge Orders        Ordered    ibuprofen (ADVIL,MOTRIN) 800 MG tablet  Every 8 hours PRN     02/21/17 2221       Tilden Fossa, MD 02/22/17 [phone]

## 2017-02-21 NOTE — ED Triage Notes (Signed)
Patient reports that she was at work and she hurt her left wrist  - the patient states that she felt like something pop

## 2017-02-21 NOTE — ED Notes (Signed)
Alert, NAD, calm, interactive, tearful, resps e/u, speaking in clear complete sentences, no dyspnea noted, skin W&D, initial VSS, c/o L wrist pain, pinpoints to posterior wrist, lateral wrist with numbness and tingling to L 4th & 5th digits, strength strong and equal, took ibuprofen 800mg  at 1530, (denies: sob, NVD, fever, weakness, dizziness or visual changes). Here alone/driving.

## 2017-02-21 NOTE — ED Notes (Signed)
Notepad came unplugged. Pt unable to sign.

## 2017-02-21 NOTE — ED Notes (Signed)
Patient states that [facility] supervisor states that she does not need a UDS

## 2017-03-05 ENCOUNTER — Encounter: Payer: Self-pay | Admitting: Family Medicine

## 2017-04-04 DIAGNOSIS — R300 Dysuria: Secondary | ICD-10-CM | POA: Diagnosis not present

## 2017-04-08 ENCOUNTER — Other Ambulatory Visit (HOSPITAL_COMMUNITY): Payer: Self-pay | Admitting: Surgery

## 2017-04-09 ENCOUNTER — Encounter: Payer: Self-pay | Admitting: Family Medicine

## 2017-04-09 ENCOUNTER — Ambulatory Visit: Payer: 59 | Admitting: Family Medicine

## 2017-04-09 VITALS — BP 125/81 | HR 93 | Temp 98.9°F | Ht 66.0 in | Wt 280.0 lb

## 2017-04-09 DIAGNOSIS — J069 Acute upper respiratory infection, unspecified: Secondary | ICD-10-CM | POA: Diagnosis not present

## 2017-04-09 MED ORDER — CEFDINIR 300 MG PO CAPS: 300.0000 mg | ORAL_CAPSULE | Freq: Two times a day (BID) | ORAL | 0 refills | Status: DC

## 2017-04-09 MED ORDER — BENZONATATE 200 MG PO CAPS: 200.0000 mg | ORAL_CAPSULE | Freq: Three times a day (TID) | ORAL | 0 refills | Status: DC | PRN

## 2017-04-09 NOTE — Progress Notes (Signed)
Barbara [name] is a 42 y.o. female who presents to [name]: Primary [facility] Medicine today for sore throat.  Debar notes a 1 day history of sore throat associate with runny nose and congestion.  She notes positive sick contacts at home and at work.  She notes a mild cough as well that is nonproductive.  She is tried some cough drops which is helped a bit.  She is worried that she will worsen.  No fevers or chills.  No vomiting or diarrhea.  Additionally Barbara [name] is considering bariatric surgery for her obesity.  She has a form for ration and referral that she would like me to fill out.  She is optimistic about losing weight and feels that losing weight is important for her health.   Past Medical History:  Diagnosis Date  . Anxiety   . Complication of anesthesia   . Depression   . Migraine headache   . Palpitations   . Panic attacks   . PONV (postoperative nausea and vomiting)   . Thyroid disease   . Unspecified symptom associated with female genital organs 12/22/2012   Past Surgical History:  Procedure Laterality Date  . ELBOW SURGERY Left   . ENDOMETRIAL ABLATION    . ESOPHAGOGASTRODUODENOSCOPY N/A 05/18/2014   SLF: 1. Mild esophaigitis. 2. Mild non-erosive gastritis.   Marland Kitchen FRACTURE SURGERY     left distal radius with internal fixation 11/01/2006   Social History   Tobacco Use  . Smoking status: Never Smoker  . Smokeless tobacco: Never Used  Substance Use Topics  . Alcohol use: No   family history includes Diabetes in her father and paternal grandmother; Hyperlipidemia in her father and paternal grandmother; Hypertension in her father and paternal grandmother.  ROS as above:  Medications: Current Outpatient Medications  Medication Sig Dispense Refill  . ALPRAZolam (XANAX) 0.25 MG tablet Take 1 tablet (0.25 mg total) by mouth 2 (two) times daily as needed for anxiety. 60 tablet 1    . cetirizine (ZYRTEC) 10 MG tablet Take 10 mg by mouth daily.    . Cholecalciferol (VITAMIN D3 PO) Take 1,000 Int'l Units by mouth daily.     . Cyanocobalamin (VITAMIN B-12 PO) Take 1,000 mg by mouth daily.     Marland Kitchen eletriptan (RELPAX) 40 MG tablet One tablet by mouth at onset of headache. May repeat in 2 hours if headache persists or recurs. may repeat in 2 hours if necessary    . ibuprofen (ADVIL,MOTRIN) 800 MG tablet Take 1 tablet (800 mg total) by mouth every 8 (eight) hours as needed for moderate pain. 21 tablet 0  . liothyronine (CYTOMEL) 5 MCG tablet Take 3 tablets (15 mcg total) by mouth daily. 270 tablet 1  . ondansetron (ZOFRAN) 4 MG tablet Take 1 tablet (4 mg total) by mouth every 8 (eight) hours as needed for nausea or vomiting. 20 tablet 12  . pantoprazole (PROTONIX) 40 MG tablet 1 po 30 MINS PRIOR TO BREAKFAST AND SUPPER. (Patient taking differently: 1 po 30 MINS PRIOR TO BREAKFAST) 180 tablet 3  . PROAIR HFA 108 (90 BASE) MCG/ACT inhaler Inhale 2 puffs into the lungs every 4 (four) hours as needed.  0  . promethazine (PHENERGAN) 25 MG tablet Take 25 mg by mouth every 6 (six) hours as needed for nausea.    . benzonatate (TESSALON) 200 MG capsule Take 1 capsule (200 mg total) by mouth 3 (three) times daily as needed for cough. 45  capsule 0  . cefdinir (OMNICEF) 300 MG capsule Take 1 capsule (300 mg total) by mouth 2 (two) times daily. 14 capsule 0   No current facility-administered medications for this visit.    Allergies  Allergen Reactions  . Penicillins     REACTION: hives  . Prednisone Hives  . Latex Rash    Health Maintenance Health Maintenance  Topic Date Due  . HIV Screening  12/16/1988  . TETANUS/TDAP  04/09/2018 (Originally 12/16/1992)  . PAP SMEAR  03/26/2019  . INFLUENZA VACCINE  Completed     Exam:  BP 125/81   Pulse 93   Temp 98.9 F (37.2 C) (Oral)   Ht 5\' 6"  (1.676 m)   Wt 280 lb (127 kg)   BMI 45.19 kg/m  Gen: Well NAD HEENT: EOMI,  MMM is  non-erythematous with no exudates.  Cobblestoning is present.  Normal tympanic membranes.  Clear nasal discharge.  Minimal cervical lymphadenopathy Lungs: Normal work of breathing. CTABL Heart: RRR no MRG Abd: NABS, Soft. Nondistended, Nontender Exts: Brisk capillary refill, warm and well perfused.    No results found for this or any previous visit (from the past 72 hour(s)). No results found.    Assessment and Plan: 42 y.o. female with viral URI with sore throat.  No evidence of strep or flu.  Symptomatic management with over-the-counter medications and Tessalon Perles.  Backup Omnicef for use if she develops second sickening.  Morbid Obesity: Agree with referral. Will fill out form. Recheck PRN.   Tdap needed. Patient ill today. Will do this at a later date.    No orders of the defined types were placed in this encounter.  Meds ordered this encounter  Medications  . cefdinir (OMNICEF) 300 MG capsule    Sig: Take 1 capsule (300 mg total) by mouth 2 (two) times daily.    Dispense:  14 capsule    Refill:  0  . benzonatate (TESSALON) 200 MG capsule    Sig: Take 1 capsule (200 mg total) by mouth 3 (three) times daily as needed for cough.    Dispense:  45 capsule    Refill:  0     Discussed warning signs or symptoms. Please see discharge instructions. Patient expresses understanding.

## 2017-04-09 NOTE — Patient Instructions (Addendum)
Thank you for coming in today. Use over the counter medicine as needed.  Use tessalon for cough as needed.  Take omnicef antibiotic if worse.  Recheck as needed.

## 2017-04-30 ENCOUNTER — Encounter: Payer: 59 | Attending: Surgery | Admitting: Skilled Nursing Facility1

## 2017-04-30 ENCOUNTER — Encounter: Payer: Self-pay | Admitting: Skilled Nursing Facility1

## 2017-04-30 DIAGNOSIS — Z713 Dietary counseling and surveillance: Secondary | ICD-10-CM | POA: Insufficient documentation

## 2017-04-30 DIAGNOSIS — Z791 Long term (current) use of non-steroidal anti-inflammatories (NSAID): Secondary | ICD-10-CM | POA: Diagnosis not present

## 2017-04-30 DIAGNOSIS — F419 Anxiety disorder, unspecified: Secondary | ICD-10-CM | POA: Insufficient documentation

## 2017-04-30 DIAGNOSIS — E079 Disorder of thyroid, unspecified: Secondary | ICD-10-CM | POA: Diagnosis not present

## 2017-04-30 DIAGNOSIS — Z8261 Family history of arthritis: Secondary | ICD-10-CM | POA: Diagnosis not present

## 2017-04-30 DIAGNOSIS — Z79899 Other long term (current) drug therapy: Secondary | ICD-10-CM | POA: Diagnosis not present

## 2017-04-30 DIAGNOSIS — Z8249 Family history of ischemic heart disease and other diseases of the circulatory system: Secondary | ICD-10-CM | POA: Diagnosis not present

## 2017-04-30 DIAGNOSIS — Z888 Allergy status to other drugs, medicaments and biological substances status: Secondary | ICD-10-CM | POA: Diagnosis not present

## 2017-04-30 DIAGNOSIS — Z833 Family history of diabetes mellitus: Secondary | ICD-10-CM | POA: Diagnosis not present

## 2017-04-30 DIAGNOSIS — Z9889 Other specified postprocedural states: Secondary | ICD-10-CM | POA: Diagnosis not present

## 2017-04-30 DIAGNOSIS — Z880 Allergy status to penicillin: Secondary | ICD-10-CM | POA: Diagnosis not present

## 2017-04-30 DIAGNOSIS — M549 Dorsalgia, unspecified: Secondary | ICD-10-CM | POA: Diagnosis not present

## 2017-04-30 DIAGNOSIS — E669 Obesity, unspecified: Secondary | ICD-10-CM

## 2017-04-30 NOTE — Progress Notes (Signed)
Pre-Op Assessment Visit:  Pre-Operative Sleeve Gastrectomy Surgery  Patient was seen on 04/30/2017 for Pre-Operative Nutrition Assessment. Assessment and [facility] on 04/30/2017.   Pt states she is a picky eater. Pt states she used to eat orange goodie powders to stay awake on third shift. Pt states she needs 6 months of SWL not counting the assessment. Pt states she works 12 hour shifts. Pt states she will try different lettuces. Pt states she does not like vegetables. At the end of the appointment without any noticed indication the pt began to cry stating she is very over whelmed with Workers comp and her son having something medically wrong but they do not know what with his spleen with a previous suicide attempt and also currently being tested for autism as well as her niece battling cancer.   Pt expectation of surgery: to lose weight  Pt expectation of Dietitian: none  Start weight at [facility]: 277 BMI: 44.84  24 hr Dietary Recall: First Meal: hashbrown  Snack: stiring cheese and pop chips Second Meal:  Snack:  Third Meal: cereal Snack:  Beverages: sugar drink, sweet tea, water, iced coffee   Encouraged to engage in 150 minutes of moderate physical activity including cardiovascular and weight baring weekly  Handouts given during visit include:  . Pre-Op Goals . Bariatric Surgery Protein Shakes During the appointment today the following Pre-Op Goals were reviewed with the patient: . Maintain or lose weight as instructed by your surgeon . Make healthy food choices . Begin to limit portion sizes . Limited concentrated sugars and fried foods . Keep fat/sugar in the single digits per serving on             food labels . Practice CHEWING your food  (aim for 30 chews per bite or until applesauce consistency) . Practice not drinking 15 minutes before, during, and 30 minutes after each meal/snack . Avoid all  carbonated beverages  . Avoid/limit caffeinated beverages  . Avoid all sugar-sweetened beverages . Consume 3 meals per day; eat every 3-5 hours . Make a list of non-food related activities . Aim for 64-100 ounces of FLUID daily  . Aim for at least 60-80 grams of PROTEIN daily . Look for a liquid protein source that contain ?15 g protein and ?5 g carbohydrate  (ex: shakes, drinks, shots)  -Follow diet recommendations listed below   Energy and Macronutrient Recomendations: Calories: 1500 Carbohydrate: 170 Protein: 112 Fat: 42  Demonstrated degree of understanding via:  Teach Back  Teaching Method Utilized:  Visual Auditory Hands on  Barriers to learning/adherence to lifestyle change: currently under a lot of stress  Patient to call the Nutrition and Diabetes Education Services to enroll in Pre-Op and Post-Op Nutrition Education when surgery date is scheduled.

## 2017-05-01 ENCOUNTER — Ambulatory Visit (HOSPITAL_COMMUNITY)
Admission: RE | Admit: 2017-05-01 | Discharge: 2017-05-01 | Disposition: A | Discharge status: Home / Self Care | Payer: 59 | Source: Ambulatory Visit | Attending: Surgery | Admitting: Surgery

## 2017-05-01 ENCOUNTER — Other Ambulatory Visit: Payer: Self-pay

## 2017-05-01 DIAGNOSIS — R9431 Abnormal electrocardiogram [ECG] [EKG]: Secondary | ICD-10-CM | POA: Insufficient documentation

## 2017-05-01 DIAGNOSIS — Z01818 Encounter for other preprocedural examination: Secondary | ICD-10-CM | POA: Diagnosis not present

## 2017-05-21 DIAGNOSIS — M79669 Pain in unspecified lower leg: Secondary | ICD-10-CM | POA: Diagnosis not present

## 2017-05-25 ENCOUNTER — Encounter: Payer: Self-pay | Admitting: Skilled Nursing Facility1

## 2017-05-25 ENCOUNTER — Encounter: Payer: 59 | Attending: Surgery | Admitting: Skilled Nursing Facility1

## 2017-05-25 DIAGNOSIS — Z713 Dietary counseling and surveillance: Secondary | ICD-10-CM | POA: Diagnosis not present

## 2017-05-25 DIAGNOSIS — Z888 Allergy status to other drugs, medicaments and biological substances status: Secondary | ICD-10-CM | POA: Insufficient documentation

## 2017-05-25 DIAGNOSIS — Z8249 Family history of ischemic heart disease and other diseases of the circulatory system: Secondary | ICD-10-CM | POA: Diagnosis not present

## 2017-05-25 DIAGNOSIS — Z9889 Other specified postprocedural states: Secondary | ICD-10-CM | POA: Diagnosis not present

## 2017-05-25 DIAGNOSIS — Z880 Allergy status to penicillin: Secondary | ICD-10-CM | POA: Diagnosis not present

## 2017-05-25 DIAGNOSIS — M549 Dorsalgia, unspecified: Secondary | ICD-10-CM | POA: Insufficient documentation

## 2017-05-25 DIAGNOSIS — F419 Anxiety disorder, unspecified: Secondary | ICD-10-CM | POA: Insufficient documentation

## 2017-05-25 DIAGNOSIS — Z8261 Family history of arthritis: Secondary | ICD-10-CM | POA: Diagnosis not present

## 2017-05-25 DIAGNOSIS — Z79899 Other long term (current) drug therapy: Secondary | ICD-10-CM | POA: Diagnosis not present

## 2017-05-25 DIAGNOSIS — Z791 Long term (current) use of non-steroidal anti-inflammatories (NSAID): Secondary | ICD-10-CM | POA: Insufficient documentation

## 2017-05-25 DIAGNOSIS — E079 Disorder of thyroid, unspecified: Secondary | ICD-10-CM | POA: Insufficient documentation

## 2017-05-25 DIAGNOSIS — Z833 Family history of diabetes mellitus: Secondary | ICD-10-CM | POA: Diagnosis not present

## 2017-05-25 NOTE — Progress Notes (Signed)
  Assessment:   1st SWL Appointment.  Pt states she is a picky eater.Pt states she needs 6 months of SWL not counting the assessment. Pt states she works 12 hour shifts.  Pt states she does not like vegetables. Pt states she tried Isopure and does not like it and also has tried different lettuces. Pt states she also does not like the premier clear and drinking 60-80 ounces of water.Pt states he has also been chewing well and not drinking more. 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 Pt states she joined the bariatric facebook group. Pt states her 44 year old son was diagnosed with autism. Pt states he started using the baritastic app and thinks it is really easy. Pt states she walks [id number] steps at work working 12 hour shifts.  Talk about physical activity next time. Start Wt at NDES: 277 Wt: 283 BMI: 45.68   MEDICATIONS: See List   DIETARY INTAKE:  24-hr recall:  B ( AM): protein shake Snk ( AM): cheesestick L ( PM): protein shake Snk ( PM) pimento cheese and crackers D ( PM): salad with different lettuce and ham and cheese and cucumber and italian dressings Snk ( PM): jerky Beverages: water, ice  Usual physical activity: ADL's  Energy and Macronutrient Recomendations: Calories: 1500 Carbohydrate: 170 Protein: 112 Fat: 42   Nutritional Diagnosis:  Hoffman-3.3 Overweight/obesity related to past poor dietary habits and physical inactivity as evidenced by patient w/ planned sleeve gastrectomy surgery following dietary guidelines for continued weight loss.    Intervention:  Nutrition counseling for upcoming Bariatric Surgery. Goals: -Encouraged to engage in 150 minutes of moderate physical activity including cardiovascular and weight baring weekly -Work on cutting out carbonation  -Work on putting together healthy meals using the meal ideas sheet  Teaching Method Utilized:  Visual Auditory Hands on  Barriers to learning/adherence to lifestyle change: emotional    Demonstrated degree of understanding via:  Teach Back   Monitoring/Evaluation:  Dietary intake, exercise, and body weight prn.

## 2017-06-02 DIAGNOSIS — N3000 Acute cystitis without hematuria: Secondary | ICD-10-CM | POA: Diagnosis not present

## 2017-06-11 ENCOUNTER — Ambulatory Visit: Payer: Self-pay | Admitting: Skilled Nursing Facility1

## 2017-06-22 ENCOUNTER — Encounter: Payer: Self-pay | Admitting: Skilled Nursing Facility1

## 2017-06-22 ENCOUNTER — Encounter: Payer: 59 | Attending: Surgery | Admitting: Skilled Nursing Facility1

## 2017-06-22 DIAGNOSIS — Z8261 Family history of arthritis: Secondary | ICD-10-CM | POA: Diagnosis not present

## 2017-06-22 DIAGNOSIS — Z880 Allergy status to penicillin: Secondary | ICD-10-CM | POA: Insufficient documentation

## 2017-06-22 DIAGNOSIS — Z9889 Other specified postprocedural states: Secondary | ICD-10-CM | POA: Insufficient documentation

## 2017-06-22 DIAGNOSIS — Z713 Dietary counseling and surveillance: Secondary | ICD-10-CM | POA: Diagnosis not present

## 2017-06-22 DIAGNOSIS — Z79899 Other long term (current) drug therapy: Secondary | ICD-10-CM | POA: Insufficient documentation

## 2017-06-22 DIAGNOSIS — Z888 Allergy status to other drugs, medicaments and biological substances status: Secondary | ICD-10-CM | POA: Diagnosis not present

## 2017-06-22 DIAGNOSIS — Z833 Family history of diabetes mellitus: Secondary | ICD-10-CM | POA: Diagnosis not present

## 2017-06-22 DIAGNOSIS — E079 Disorder of thyroid, unspecified: Secondary | ICD-10-CM | POA: Diagnosis not present

## 2017-06-22 DIAGNOSIS — E669 Obesity, unspecified: Secondary | ICD-10-CM

## 2017-06-22 DIAGNOSIS — F419 Anxiety disorder, unspecified: Secondary | ICD-10-CM | POA: Insufficient documentation

## 2017-06-22 DIAGNOSIS — Z791 Long term (current) use of non-steroidal anti-inflammatories (NSAID): Secondary | ICD-10-CM | POA: Insufficient documentation

## 2017-06-22 DIAGNOSIS — M549 Dorsalgia, unspecified: Secondary | ICD-10-CM | POA: Insufficient documentation

## 2017-06-22 DIAGNOSIS — Z8249 Family history of ischemic heart disease and other diseases of the circulatory system: Secondary | ICD-10-CM | POA: Diagnosis not present

## 2017-06-22 NOTE — Patient Instructions (Signed)
-  Try to work in another nutritious meal throughout the day

## 2017-06-22 NOTE — Progress Notes (Signed)
  Assessment:   2nd SWL Appointment.  Pt states she is a picky eater.Pt states she needs 6 months of SWL not counting the assessment. Pt states she works 12 hour shifts.  Pt states she does not like vegetables. Pt states she tried Isopure and does not like it and also has tried different lettuces. Pt states she also does not like the premier clear and drinking 60-80 ounces of water.Pt states he has also been chewing well and not drinking more. 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 Pt states she joined the bariatric facebook group. Pt states her 75 year old son was diagnosed with autism. Pt states he started using the baritastic app and thinks it is really easy. Pt states she walks [id number] steps at work working 12 hour shifts.    Pt arrives having lost 6.9 pounds since last visit. Pt states she has tried the premier protein drinks and have cut out all carbonation and eating p3 packs for snack and eating more often throughout the day. Pt states she has started centrum one a day multivitamin. Pt states her psychologist Dr. Verdell Face asked if she eats past 8pm so now she will not eat past 8pm which is about when she gest home form work. Pt states she has had zucchini and carrots and steak and liked the vegetables.never had eaten steak or pork chops and does not eat chicken on the bone. Pt states she will be flying for the first time to go with her daughter to [place] for a work trip. Pt states since coming to her nutrition appointments she has been trying various vegetables and protein sources and other types of foods.  Start Wt at N[facility]: 277 Wt: 283  276.1 BMI: 45.68   MEDICATIONS: See List   DIETARY INTAKE:  24-hr recall:  B ( 8AM): protein shake and fruit or greek yogurt Snk (11-12 AM): cheesestick L ( 3PM): protein shake and 6-8 crackers with pack of tuna Snk (6 PM) fruit D ( PM):protein shake or cheese stick Snk ( PM): jerky Beverages: 80oz water, ice  Usual physical activity: walking 2-3  miles   Energy and Macronutrient Recomendations: Calories: 1500 Carbohydrate: 170 Protein: 112 Fat: 42   Nutritional Diagnosis:  Barbara [name]-3.3 Overweight/obesity related to past poor dietary habits and physical inactivity as evidenced by patient w/ planned sleeve gastrectomy surgery following dietary guidelines for continued weight loss.    Intervention:  Nutrition counseling for upcoming Bariatric Surgery. Goals: -Encouraged to engage in 150 minutes of moderate physical activity including cardiovascular and weight baring weekly -Try to work in another nutritious meal throughout the day  -Try a smoothie with spinach, fruits, fat free cows milk, powder peanutbutter Teaching Method Utilized:  Visual Auditory Hands on  Barriers to learning/adherence to lifestyle change: emotional   Demonstrated degree of understanding via:  Teach Back   Monitoring/Evaluation:  Dietary intake, exercise, and body weight prn.

## 2017-07-04 ENCOUNTER — Telehealth: Payer: 59 | Admitting: Nurse Practitioner

## 2017-07-04 DIAGNOSIS — N3000 Acute cystitis without hematuria: Secondary | ICD-10-CM

## 2017-07-04 MED ORDER — NITROFURANTOIN MONOHYD MACRO 100 MG PO CAPS: 100.0000 mg | ORAL_CAPSULE | Freq: Two times a day (BID) | ORAL | 0 refills | Status: DC

## 2017-07-04 NOTE — Progress Notes (Signed)

## 2017-07-05 DIAGNOSIS — G43819 Other migraine, intractable, without status migrainosus: Secondary | ICD-10-CM | POA: Diagnosis not present

## 2017-07-23 ENCOUNTER — Encounter: Payer: 59 | Attending: Surgery | Admitting: Skilled Nursing Facility1

## 2017-07-23 ENCOUNTER — Encounter: Payer: Self-pay | Admitting: Skilled Nursing Facility1

## 2017-07-23 DIAGNOSIS — Z8261 Family history of arthritis: Secondary | ICD-10-CM | POA: Insufficient documentation

## 2017-07-23 DIAGNOSIS — Z713 Dietary counseling and surveillance: Secondary | ICD-10-CM | POA: Insufficient documentation

## 2017-07-23 DIAGNOSIS — Z8249 Family history of ischemic heart disease and other diseases of the circulatory system: Secondary | ICD-10-CM | POA: Diagnosis not present

## 2017-07-23 DIAGNOSIS — Z79899 Other long term (current) drug therapy: Secondary | ICD-10-CM | POA: Diagnosis not present

## 2017-07-23 DIAGNOSIS — Z880 Allergy status to penicillin: Secondary | ICD-10-CM | POA: Diagnosis not present

## 2017-07-23 DIAGNOSIS — J029 Acute pharyngitis, unspecified: Secondary | ICD-10-CM | POA: Diagnosis not present

## 2017-07-23 DIAGNOSIS — F419 Anxiety disorder, unspecified: Secondary | ICD-10-CM | POA: Diagnosis not present

## 2017-07-23 DIAGNOSIS — Z791 Long term (current) use of non-steroidal anti-inflammatories (NSAID): Secondary | ICD-10-CM | POA: Insufficient documentation

## 2017-07-23 DIAGNOSIS — Z9889 Other specified postprocedural states: Secondary | ICD-10-CM | POA: Insufficient documentation

## 2017-07-23 DIAGNOSIS — Z833 Family history of diabetes mellitus: Secondary | ICD-10-CM | POA: Diagnosis not present

## 2017-07-23 DIAGNOSIS — Z888 Allergy status to other drugs, medicaments and biological substances status: Secondary | ICD-10-CM | POA: Insufficient documentation

## 2017-07-23 DIAGNOSIS — E079 Disorder of thyroid, unspecified: Secondary | ICD-10-CM | POA: Diagnosis not present

## 2017-07-23 DIAGNOSIS — E669 Obesity, unspecified: Secondary | ICD-10-CM

## 2017-07-23 DIAGNOSIS — M549 Dorsalgia, unspecified: Secondary | ICD-10-CM | POA: Diagnosis not present

## 2017-07-23 NOTE — Progress Notes (Signed)
  Assessment:   2nd SWL Appointment.  Pt states she is a picky eater.Pt states she needs 6 months of SWL not counting the assessment. Pt states she works 12 hour shifts.  Pt states she does not like vegetables. Pt states she tried Isopure and does not like it and also has tried different lettuces. Pt states she also does not like the premier clear and drinking 60-80 ounces of water.Pt states he has also been chewing well and not drinking more. 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 Pt states she joined the bariatric facebook group. Pt states her 44 year old son was diagnosed with autism. Pt states he started using the baritastic app and thinks it is really easy. Pt states she walks [phone] steps at work working 12 hour shifts.    Pt arrives with her support husband. Pt states she needs 2 more SWL visits. Pt arrives having lost about 2 pounds. Pt states she wants to eat honey to help with her allergies. Pt states she has started biotin for her nail health. Pt and her husband had specific questions which were answered. Pt states she brought her husband due to him having had a severe concussion and not being able to be left alone.    Start Wt at [facility]: 277 Wt: 274.8 BMI: 45.68   MEDICATIONS: See List   DIETARY INTAKE:  24-hr recall: still working on eating 2-3 meals B ( 8AM): protein shake and fruit or greek yogurt Snk (11-12 AM): cheesestick L ( 3PM): protein shake and 6-8 crackers with pack of tuna or chicken on crasant with chips Snk (6 PM) fruit or yogurt  D ( PM):protein shake or cheese stick Snk ( PM): jerky Beverages: 2 64oz water with flavorings, ice, lemonade with suagr   Usual physical activity: walking 2-3 miles   Energy and Macronutrient Recomendations: Calories: 1500 Carbohydrate: 170 Protein: 112 Fat: 42   Nutritional Diagnosis:  [place]-3.3 Overweight/obesity related to past poor dietary habits and physical inactivity as evidenced by patient w/ planned sleeve gastrectomy  surgery following dietary guidelines for continued weight loss.    Intervention:  Nutrition counseling for upcoming Bariatric Surgery. Goals: -Encouraged to engage in 150 minutes of moderate physical activity including cardiovascular and weight baring weekly -Try to work in another nutritious meal throughout the day  -Try a smoothie with spinach, fruits, fat free cows milk, powder peanutbutter Teaching Method Utilized:  Visual Auditory Hands on  Barriers to learning/adherence to lifestyle change: emotional   Demonstrated degree of understanding via:  Teach Back   Monitoring/Evaluation:  Dietary intake, exercise, and body weight prn.

## 2017-08-25 DIAGNOSIS — G5620 Lesion of ulnar nerve, unspecified upper limb: Secondary | ICD-10-CM | POA: Insufficient documentation

## 2017-08-25 DIAGNOSIS — G5622 Lesion of ulnar nerve, left upper limb: Secondary | ICD-10-CM | POA: Insufficient documentation

## 2017-08-26 ENCOUNTER — Telehealth: Payer: Self-pay | Admitting: Family Medicine

## 2017-08-26 NOTE — Telephone Encounter (Signed)
Received a form from [employer] for [employer] for this patient. Forms have been faxed and received confirmation. Placing copy in my drawer while waiting waiting for scan. Left message for patient that [facility] and for her to call back with any questions.Closing encounter-HSM.

## 2017-08-28 ENCOUNTER — Encounter: Payer: Self-pay | Admitting: Registered"

## 2017-08-28 ENCOUNTER — Encounter: Payer: 59 | Attending: Surgery | Admitting: Registered"

## 2017-08-28 DIAGNOSIS — Z9889 Other specified postprocedural states: Secondary | ICD-10-CM | POA: Insufficient documentation

## 2017-08-28 DIAGNOSIS — Z713 Dietary counseling and surveillance: Secondary | ICD-10-CM | POA: Insufficient documentation

## 2017-08-28 DIAGNOSIS — Z880 Allergy status to penicillin: Secondary | ICD-10-CM | POA: Diagnosis not present

## 2017-08-28 DIAGNOSIS — Z8249 Family history of ischemic heart disease and other diseases of the circulatory system: Secondary | ICD-10-CM | POA: Insufficient documentation

## 2017-08-28 DIAGNOSIS — Z833 Family history of diabetes mellitus: Secondary | ICD-10-CM | POA: Diagnosis not present

## 2017-08-28 DIAGNOSIS — E079 Disorder of thyroid, unspecified: Secondary | ICD-10-CM | POA: Insufficient documentation

## 2017-08-28 DIAGNOSIS — Z8261 Family history of arthritis: Secondary | ICD-10-CM | POA: Insufficient documentation

## 2017-08-28 DIAGNOSIS — E669 Obesity, unspecified: Secondary | ICD-10-CM

## 2017-08-28 DIAGNOSIS — Z888 Allergy status to other drugs, medicaments and biological substances status: Secondary | ICD-10-CM | POA: Insufficient documentation

## 2017-08-28 DIAGNOSIS — Z79899 Other long term (current) drug therapy: Secondary | ICD-10-CM | POA: Insufficient documentation

## 2017-08-28 DIAGNOSIS — M549 Dorsalgia, unspecified: Secondary | ICD-10-CM | POA: Diagnosis not present

## 2017-08-28 DIAGNOSIS — Z791 Long term (current) use of non-steroidal anti-inflammatories (NSAID): Secondary | ICD-10-CM | POA: Insufficient documentation

## 2017-08-28 DIAGNOSIS — F419 Anxiety disorder, unspecified: Secondary | ICD-10-CM | POA: Diagnosis not present

## 2017-08-28 NOTE — Patient Instructions (Addendum)
-   Continue to work on not drinking 15 minutes before eating, not while eating, and waiting 30 minutes after eating to drink.   - Try eating salad for dinner on work days.

## 2017-08-28 NOTE — Progress Notes (Signed)
  Assessment:   4th SWL Appointment.   Pt arrives having lost 6.6 lbs from previous visit. Pt states she works 12 hours shifts 6:45am-7pm weekly with alternating days. Pt states she is working on trying a variety of lettuces. Pt states she tried quinoa and did not like it. Pt states she goes to bed at 9pm. Pt states she used to starve herself to try to lose weight; still working on shifting her mindset. Pt states she is working on getting beyond the appearance of foods now; trying new things like grilled shrimp and chicken. Pt states likes Premier Protein (cookies and cream, strawberries and cream, bananas and cream, clear drink) options.   Pt states she has started making protein smoothies for breakfast; still working on trying to eat 3 meals/day.   Pt states she is a picky eater.Pt states she needs 6 months of SWL not counting the assessment. Pt states she does not like vegetables.  Pt states she tried Isopure and does not like it and also has tried different lettuces. Pt states she is drinking 60-80 ounces of water. Pt states he has also been chewing well and not drinking more. 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 Pt states she joined the bariatric facebook group. Pt states her 44 year old son was diagnosed with autism. Pt states he started using the baritastic app and thinks it is really easy. Pt states she walks [phone] steps at work working 12 hour shifts.   Pt states she wants to eat honey to help with her allergies. Pt states she has started biotin for her nail health.    Start Wt at [facility]: 277 Wt: 268.2 BMI: 43.29   MEDICATIONS: See List   DIETARY INTAKE:  24-hr recall: still working on eating 2-3 meals B ( 6AM): smoothie (PB2, 1% Fair Life milk, Gene Pro protein)  Snk (10 AM): Protein shake or protein bar L (2 PM): salad with pepperoni or protein shake and 6-8 crackers with pack of tuna or chicken on crasant with chips Snk (5 PM) cheese stick D ( PM): typically skips Snk  (8 PM): fruit Beverages: 2 64oz water with flavorings, ice, lemonade with suagr   Usual physical activity: walking 2-3 miles   Energy and Macronutrient Recomendations: Calories: 1500 Carbohydrate: 170 Protein: 112 Fat: 42   Nutritional Diagnosis:  [place]-3.3 Overweight/obesity related to past poor dietary habits and physical inactivity as evidenced by patient w/ planned sleeve gastrectomy surgery following dietary guidelines for continued weight loss.    Intervention:  Nutrition counseling for upcoming Bariatric Surgery. Goals: -Encouraged to engage in 150 minutes of moderate physical activity including cardiovascular and weight baring weekly - Continue to work on not drinking 15 minutes before eating, not while eating, and waiting 30 minutes after eating to drink.  - Try eating salad for dinner on work days.    Teaching Method Utilized:  Visual Auditory Hands on  Barriers to learning/adherence to lifestyle change: emotional   Demonstrated degree of understanding via:  Teach Back   Monitoring/Evaluation:  Dietary intake, exercise, and body weight in 1 month(s).

## 2017-09-08 DIAGNOSIS — R600 Localized edema: Secondary | ICD-10-CM | POA: Diagnosis not present

## 2017-09-21 ENCOUNTER — Encounter: Payer: Self-pay | Admitting: Family Medicine

## 2017-09-21 MED ORDER — ELETRIPTAN HYDROBROMIDE 40 MG PO TABS: 40.0000 mg | ORAL_TABLET | ORAL | 11 refills | Status: DC | PRN

## 2017-09-26 ENCOUNTER — Encounter: Payer: Self-pay | Admitting: Emergency Medicine

## 2017-09-26 ENCOUNTER — Emergency Department
Admission: EM | Admit: 2017-09-26 | Discharge: 2017-09-26 | Disposition: A | Discharge status: Home / Self Care | Payer: 59 | Source: Home / Self Care | Attending: Family Medicine | Admitting: Family Medicine

## 2017-09-26 DIAGNOSIS — G43009 Migraine without aura, not intractable, without status migrainosus: Secondary | ICD-10-CM | POA: Diagnosis not present

## 2017-09-26 MED ORDER — METOCLOPRAMIDE HCL 5 MG/ML IJ SOLN
5.0000 mg | Freq: Once | INTRAMUSCULAR | Status: AC
  Administered 2017-09-26: 5 mg via INTRAMUSCULAR

## 2017-09-26 MED ORDER — KETOROLAC TROMETHAMINE 60 MG/2ML IM SOLN
60.0000 mg | Freq: Once | INTRAMUSCULAR | Status: AC
  Administered 2017-09-26: 60 mg via INTRAMUSCULAR

## 2017-09-26 MED ORDER — DEXAMETHASONE SODIUM PHOSPHATE 10 MG/ML IJ SOLN
10.0000 mg | Freq: Once | INTRAMUSCULAR | Status: AC
  Administered 2017-09-26: 10 mg via INTRAMUSCULAR

## 2017-09-26 NOTE — ED Triage Notes (Signed)
Patient c/o migraine x 1 day, took Relpax x 2 and 800mg  Ibuprofen and Relpax x 1 today, no Ibuprofen today, no vomiting.

## 2017-09-26 NOTE — ED Provider Notes (Signed)
[name]    CSN: [id number] Arrival date & time: 09/26/17  1609     History   Chief Complaint Chief Complaint  Patient presents with  . Migraine    HPI Barbara [name] is a 42 y.o. female.   HPI Barbara [name] is a 42 y.o. female presenting to UC with c/o frontal migraine HA that started yesterday. Pain is similar to prior migraines, 7/10. No relief with her Relpax, which she took 2 yesterday and 1 today and 800mg  ibuprofen she took yesterday. No ibuprofen today. She has been under increased stress recently. No change in vision. No weakness or numbness in arms or legs. Denies nausea or vomiting.   Past Medical History:  Diagnosis Date  . Anxiety   . Complication of anesthesia   . Depression   . Migraine headache   . Palpitations   . Panic attacks   . PONV (postoperative nausea and vomiting)   . Thyroid disease   . Unspecified symptom associated with female genital organs 12/22/2012    Patient Active Problem List   Diagnosis Date Noted  . Grief 11/25/2016  . Hypothyroid 09/02/2016  . Screening for colorectal cancer 03/25/2016  . Encounter for gynecological examination with Papanicolaou smear of cervix 03/25/2016  . Dyspepsia   . Unspecified symptom associated with female genital organs 12/22/2012  . Morbid obesity (HCC) 10/29/2009  . Anxiety state 10/26/2009  . Migraine headache 10/26/2009  . PALPITATIONS 10/26/2009    Past Surgical History:  Procedure Laterality Date  . ELBOW SURGERY Left   . ENDOMETRIAL ABLATION    . ESOPHAGOGASTRODUODENOSCOPY N/A 05/18/2014   SLF: 1. Mild esophaigitis. 2. Mild non-erosive gastritis.   Marland Kitchen FRACTURE SURGERY     left distal radius with internal fixation 11/01/2006    OB History    Gravida  2   Para  2   Term  2   Preterm      AB      Living  2     SAB      TAB      Ectopic      Multiple      Live Births  2            Home Medications    Prior to Admission medications   Medication  Sig Start Date End Date Taking? Authorizing Provider  ALPRAZolam (XANAX) 0.25 MG tablet Take 1 tablet (0.25 mg total) by mouth 2 (two) times daily as needed for anxiety. 09/02/16   Rodolph Bong, MD  benzonatate (TESSALON) 200 MG capsule Take 1 capsule (200 mg total) by mouth 3 (three) times daily as needed for cough. 04/09/17   Rodolph Bong, MD  cefdinir (OMNICEF) 300 MG capsule Take 1 capsule (300 mg total) by mouth 2 (two) times daily. 04/09/17   Rodolph Bong, MD  cetirizine (ZYRTEC) 10 MG tablet Take 10 mg by mouth daily.    [provider]  Cholecalciferol (VITAMIN D3 PO) Take 1,000 Int'l Units by mouth daily.     [provider]  Cyanocobalamin (VITAMIN B-12 PO) Take 1,000 mg by mouth daily.     [provider]  eletriptan (RELPAX) 40 MG tablet Take 1 tablet (40 mg total) by mouth as needed for migraine. may repeat in 2 hours if necessary 09/21/17   Monica Becton, MD  ibuprofen (ADVIL,MOTRIN) 800 MG tablet Take 1 tablet (800 mg total) by mouth every 8 (eight) hours as needed for moderate pain. 02/21/17  Tilden Fossa, MD  liothyronine (CYTOMEL) 5 MCG tablet Take 3 tablets (15 mcg total) by mouth daily. 10/15/16   Rodolph Bong, MD  nitrofurantoin, macrocrystal-monohydrate, (MACROBID) 100 MG capsule Take 1 capsule (100 mg total) by mouth 2 (two) times daily. 1 po BId 07/04/17   Daphine Deutscher, Mary-Margaret, FNP  ondansetron (ZOFRAN) 4 MG tablet Take 1 tablet (4 mg total) by mouth every 8 (eight) hours as needed for nausea or vomiting. 09/02/16   Rodolph Bong, MD  pantoprazole (PROTONIX) 40 MG tablet 1 po 30 MINS PRIOR TO BREAKFAST AND SUPPER. Patient taking differently: 1 po 30 MINS PRIOR TO BREAKFAST 10/11/15   Tiffany Kocher, PA-C  PROAIR HFA 108 (90 BASE) MCG/ACT inhaler Inhale 2 puffs into the lungs every 4 (four) hours as needed. 01/17/14   [provider]  promethazine (PHENERGAN) 25 MG tablet Take 25 mg by mouth every 6 (six) hours as needed for nausea.     [provider]    Family History Family History  Problem Relation Age of Onset  . Hypertension Father   . Hyperlipidemia Father   . Diabetes Father   . Diabetes Paternal Grandmother   . Hyperlipidemia Paternal Grandmother   . Hypertension Paternal Grandmother   . Colon cancer Neg Hx     Social History Social History   Tobacco Use  . Smoking status: Never Smoker  . Smokeless tobacco: Never Used  Substance Use Topics  . Alcohol use: No  . Drug use: No     Allergies   Penicillins; Prednisone; and Latex   Review of Systems Review of Systems  Constitutional: Negative for chills and fever.  Eyes: Positive for photophobia. Negative for visual disturbance.  Gastrointestinal: Negative for nausea and vomiting.  Musculoskeletal: Negative for neck pain and neck stiffness.  Neurological: Positive for headaches. Negative for dizziness, weakness and light-headedness.     Physical Exam Triage Vital Signs ED Triage Vitals  Enc Vitals Group     BP 09/26/17 1627 111/80     Pulse Rate 09/26/17 1627 82     Resp --      Temp 09/26/17 1627 98.5 F (36.9 C)     Temp Source 09/26/17 1627 Oral     SpO2 09/26/17 1627 98 %     Weight 09/26/17 1628 266 lb 12 oz (121 kg)     Height 09/26/17 1628 5' 6.5" (1.689 m)     Head Circumference --      Peak Flow --      Pain Score 09/26/17 1627 7     Pain Loc --      Pain Edu? --      Excl. in GC? --    No data found.  Updated Vital Signs BP 111/80 (BP Location: Right Arm)   Pulse 82   Temp 98.5 F (36.9 C) (Oral)   Ht 5' 6.5" (1.689 m)   Wt 266 lb 12 oz (121 kg)   SpO2 98%   BMI 42.41 kg/m   Visual Acuity Right Eye Distance:   Left Eye Distance:   Bilateral Distance:    Right Eye Near:   Left Eye Near:    Bilateral Near:     Physical Exam  Constitutional: She is oriented to person, place, and time. She appears well-developed and well-nourished. No distress.  HENT:  Head: Normocephalic and atraumatic.  Right  Ear: Tympanic membrane normal.  Left Ear: Tympanic membrane normal.  Nose: Nose normal.  Mouth/Throat: Uvula is midline, oropharynx  is clear and moist and mucous membranes are normal.  Eyes: Pupils are equal, round, and reactive to light. EOM are normal.  Neck: Normal range of motion. Neck supple.  Cardiovascular: Normal rate and regular rhythm.  Pulmonary/Chest: Effort normal and breath sounds normal. No stridor. No respiratory distress. She has no wheezes. She has no rales.  Musculoskeletal: Normal range of motion.  Neurological: She is alert and oriented to person, place, and time. No cranial nerve deficit.  Skin: Skin is warm and dry. She is not diaphoretic.  Psychiatric: She has a normal mood and affect. Her behavior is normal.  Nursing note and vitals reviewed.    UC Treatments / Results  Labs (all labs ordered are listed, but only abnormal results are displayed) Labs Reviewed - No data to display  EKG None  Radiology No results found.  Procedures Procedures (including critical care time)  Medications Ordered in UC Medications  ketorolac (TORADOL) injection 60 mg (60 mg Intramuscular Given 09/26/17 1652)  dexamethasone (DECADRON) injection 10 mg (10 mg Intramuscular Given 09/26/17 1653)  metoCLOPramide (REGLAN) injection 5 mg (5 mg Intramuscular Given 09/26/17 1654)    Initial Impression / Assessment and Plan / UC Course  I have reviewed the triage vital signs and the nursing notes.  Pertinent labs & imaging results that were available during my care of the patient were reviewed by me and considered in my medical decision making (see chart for details).     Hx and exam c/w migraine HA. Reviewed PMH. Pt has done well with "migraine cocktail" in the past. Would like to try today. Pain improved from 7/10 to 4/10 Work note provided for today.  Home info packet provided   Final Clinical Impressions(s) / UC Diagnoses   Final diagnoses:  Migraine without aura and  without status migrainosus, not intractable   Discharge Instructions   None    ED Prescriptions    None     Controlled Substance Prescriptions [place] Controlled Substance Registry consulted? Not Applicable   Rolla Platehelps, Jimia Gentles O, PA-C 09/26/17 1713

## 2017-09-29 ENCOUNTER — Encounter: Payer: Self-pay | Admitting: Registered"

## 2017-09-29 ENCOUNTER — Encounter: Payer: 59 | Attending: Surgery | Admitting: Registered"

## 2017-09-29 DIAGNOSIS — Z8261 Family history of arthritis: Secondary | ICD-10-CM | POA: Diagnosis not present

## 2017-09-29 DIAGNOSIS — Z880 Allergy status to penicillin: Secondary | ICD-10-CM | POA: Diagnosis not present

## 2017-09-29 DIAGNOSIS — E079 Disorder of thyroid, unspecified: Secondary | ICD-10-CM | POA: Diagnosis not present

## 2017-09-29 DIAGNOSIS — Z8249 Family history of ischemic heart disease and other diseases of the circulatory system: Secondary | ICD-10-CM | POA: Insufficient documentation

## 2017-09-29 DIAGNOSIS — Z833 Family history of diabetes mellitus: Secondary | ICD-10-CM | POA: Insufficient documentation

## 2017-09-29 DIAGNOSIS — Z888 Allergy status to other drugs, medicaments and biological substances status: Secondary | ICD-10-CM | POA: Diagnosis not present

## 2017-09-29 DIAGNOSIS — Z791 Long term (current) use of non-steroidal anti-inflammatories (NSAID): Secondary | ICD-10-CM | POA: Diagnosis not present

## 2017-09-29 DIAGNOSIS — F419 Anxiety disorder, unspecified: Secondary | ICD-10-CM | POA: Diagnosis not present

## 2017-09-29 DIAGNOSIS — Z713 Dietary counseling and surveillance: Secondary | ICD-10-CM | POA: Insufficient documentation

## 2017-09-29 DIAGNOSIS — Z9889 Other specified postprocedural states: Secondary | ICD-10-CM | POA: Diagnosis not present

## 2017-09-29 DIAGNOSIS — M549 Dorsalgia, unspecified: Secondary | ICD-10-CM | POA: Insufficient documentation

## 2017-09-29 DIAGNOSIS — E669 Obesity, unspecified: Secondary | ICD-10-CM

## 2017-09-29 DIAGNOSIS — Z79899 Other long term (current) drug therapy: Secondary | ICD-10-CM | POA: Insufficient documentation

## 2017-09-29 NOTE — Progress Notes (Signed)
Sleeve Gastrectomy Assessment: 5th SWL Appointment.   Pt states she has been doing well not drinking with meals, other than while eating spicy items. Pt states she has started adding in dinner option. Pt states she has eliminated carbonation.  Pt arrives having lost 2.7 lbs from previous visit.  Pt states she works 12 hours shifts 6:45am-7pm weekly with alternating days. Pt states she is working on trying a variety of lettuces. Pt states she tried quinoa and did not like it. Pt states she goes to bed at 9pm. Pt states she used to starve herself to try to lose weight; still working on shifting her mindset. Pt states she is working on getting beyond the appearance of foods now; trying new things like grilled shrimp and chicken. Pt states likes Premier Protein (cookies and cream, strawberries and cream, bananas and cream, clear drink) options.   Pt states she has started making protein smoothies for breakfast; still working on trying to eat 3 meals/day.   Pt states she is a picky eater.Pt states she needs 44 months of SWL not counting the assessment. Pt states she does not like vegetables.  Pt states she tried Isopure and does not like it and also has tried different lettuces. Pt states she is drinking 44-80 ounces of water. Pt states he has also been chewing well and not drinking more. 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 Pt states she joined the bariatric facebook group. Pt states her 213 year old son was diagnosed with autism. Pt states he started using the baritastic app and thinks it is really easy. Pt states she walks [phone] steps at work working 12 hour shifts.   Pt states she wants to eat honey to help with her allergies. Pt states she has started biotin for her nail health.    Start Wt at [facility]: 277 Wt: 265.5 BMI: 42.85   MEDICATIONS: See List   DIETARY INTAKE:  24-hr recall: still working on eating 2-3 meals B ( 6AM): smoothie (PB2, 1% Fair Life milk, Gene Pro protein)  Snk  (10 AM): Protein shake or protein bar L (2 PM): salad with pepperoni or protein shake and 6-8 crackers with pack of tuna or chicken on crasant with chips Snk (5 PM) cheese stick or watermelon D ( PM): small salad Snk (8 PM): fruit Beverages: 2 64oz water with flavorings, ice, lemonade with sugar, powerade zero, G2  Usual physical activity: walking 1.5-2 miles, 3-4 days/week   Energy and Macronutrient Recomendations: Calories: 1500 Carbohydrate: 170 Protein: 112 Fat: 42   Nutritional Diagnosis:  [place]-3.3 Overweight/obesity related to past poor dietary habits and physical inactivity as evidenced by patient w/ planned sleeve gastrectomy surgery following dietary guidelines for continued weight loss.    Intervention:  Nutrition counseling for upcoming Bariatric Surgery. Goals: -Encouraged to engage in 150 minutes of moderate physical activity including cardiovascular and weight baring weekly - Keep up the great work trying new food items and eating 3 meals a day.  - Continue with habits already established.    Teaching Method Utilized:  Visual Auditory Hands on  Barriers to learning/adherence to lifestyle change: emotional   Demonstrated degree of understanding via:  Teach Back   Monitoring/Evaluation:  Dietary intake, exercise, and body weight in 1 month(s).

## 2017-09-29 NOTE — Patient Instructions (Signed)
-   Keep up the great work trying new food items and eating 3 meals a day.   - Continue with habits already established.

## 2017-11-02 ENCOUNTER — Encounter: Payer: Self-pay | Admitting: Registered"

## 2017-11-02 ENCOUNTER — Encounter: Payer: 59 | Attending: Surgery | Admitting: Registered"

## 2017-11-02 DIAGNOSIS — Z888 Allergy status to other drugs, medicaments and biological substances status: Secondary | ICD-10-CM | POA: Insufficient documentation

## 2017-11-02 DIAGNOSIS — Z880 Allergy status to penicillin: Secondary | ICD-10-CM | POA: Insufficient documentation

## 2017-11-02 DIAGNOSIS — Z8249 Family history of ischemic heart disease and other diseases of the circulatory system: Secondary | ICD-10-CM | POA: Insufficient documentation

## 2017-11-02 DIAGNOSIS — Z8261 Family history of arthritis: Secondary | ICD-10-CM | POA: Insufficient documentation

## 2017-11-02 DIAGNOSIS — Z713 Dietary counseling and surveillance: Secondary | ICD-10-CM | POA: Diagnosis not present

## 2017-11-02 DIAGNOSIS — Z79899 Other long term (current) drug therapy: Secondary | ICD-10-CM | POA: Insufficient documentation

## 2017-11-02 DIAGNOSIS — Z833 Family history of diabetes mellitus: Secondary | ICD-10-CM | POA: Insufficient documentation

## 2017-11-02 DIAGNOSIS — E079 Disorder of thyroid, unspecified: Secondary | ICD-10-CM | POA: Diagnosis not present

## 2017-11-02 DIAGNOSIS — Z9889 Other specified postprocedural states: Secondary | ICD-10-CM | POA: Diagnosis not present

## 2017-11-02 DIAGNOSIS — Z791 Long term (current) use of non-steroidal anti-inflammatories (NSAID): Secondary | ICD-10-CM | POA: Insufficient documentation

## 2017-11-02 DIAGNOSIS — M549 Dorsalgia, unspecified: Secondary | ICD-10-CM | POA: Insufficient documentation

## 2017-11-02 DIAGNOSIS — E669 Obesity, unspecified: Secondary | ICD-10-CM

## 2017-11-02 DIAGNOSIS — F419 Anxiety disorder, unspecified: Secondary | ICD-10-CM | POA: Insufficient documentation

## 2017-11-02 NOTE — Patient Instructions (Addendum)
-   Take 5000 IU of Vitamin D.   - Continue to keep up habits already established.

## 2017-11-02 NOTE — Progress Notes (Signed)
Sleeve Gastrectomy Assessment: 6th SWL Appointment.    Pt arrives having lost 1 lb from previous visit. Pt states she has been eating 3 meals and day and not drinking around eating. Pt states she is taking a multivitamin and calcium supplements to get into routine prior to surgery.   Pt states she has eliminated carbonation.  Pt states she works 12 hours shifts 6:45am-7pm weekly with alternating days. Pt states she is working on trying a variety of lettuces. Pt states she tried quinoa and did not like it. Pt states she goes to bed at 9pm. Pt states she used to starve herself to try to lose weight; still working on shifting her mindset. Pt states she is working on getting beyond the appearance of foods now; trying new things like grilled shrimp and chicken. Pt states she likes Premier Protein (cookies and cream, strawberries and cream, bananas and cream) options.   Pt states she is a picky eater.Pt states she needs 6 months of SWL not counting the assessment. Pt states she does not like vegetables.  Pt states she is drinking 60-80 ounces of water. Pt states he has also been chewing well and not drinking more. 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 Pt states she joined the bariatric facebook group. Pt states her 44 year old son was diagnosed with autism. Pt states she started using the baritastic app and thinks it is really easy. Pt states she walks [phone] steps at work working 12 hour shifts.   Pt states she wants to eat honey to help with her allergies. Pt states she has started biotin for her nail health.    Start Wt at [facility]: 277 Wt: 266.5 BMI: 43.01   MEDICATIONS: See List   DIETARY INTAKE:  24-hr recall:  B ( 6AM): protein bowl (egg, cheese, and sausage, bacon)  Snk (10 AM): P3 or nuts L (2 PM): salad with pepperoni or protein shake and 6-8 crackers with pack of tuna or chicken on crossiant with chips Snk (5 PM) cheese stick or watermelon D ( PM): potato soup or [place]  meat[place]af + garlic bread + mashed potatoes + green beans  Snk (8 PM): fruit Beverages: 2 64oz water with flavorings, ice, lemonade with splenda, powerade zero, G2  Usual physical activity: walking 1.5-2 miles, 3-4 days/week   Energy and Macronutrient Recomendations: Calories: 1500 Carbohydrate: 170 Protein: 112 Fat: 42   Nutritional Diagnosis:  [place]-3.3 Overweight/obesity related to past poor dietary habits and physical inactivity as evidenced by patient w/ planned sleeve gastrectomy surgery following dietary guidelines for continued weight loss.    Intervention:  Nutrition counseling for upcoming Bariatric Surgery. Goals: -Encouraged to engage in 150 minutes of moderate physical activity including cardiovascular and weight baring weekly - Take 5000 IU of Vitamin D.  - Continue to keep up habits already established.     Teaching Method Utilized:  Visual Auditory Hands on  Barriers to learning/adherence to lifestyle change: emotional   Demonstrated degree of understanding via:  Teach Back   Monitoring/Evaluation:  Dietary intake, exercise, and body weight prn.

## 2017-11-05 DIAGNOSIS — Z1231 Encounter for screening mammogram for malignant neoplasm of breast: Secondary | ICD-10-CM | POA: Diagnosis not present

## 2017-11-09 ENCOUNTER — Encounter: Payer: 59 | Admitting: Registered"

## 2017-11-09 DIAGNOSIS — E669 Obesity, unspecified: Secondary | ICD-10-CM

## 2017-11-09 DIAGNOSIS — Z713 Dietary counseling and surveillance: Secondary | ICD-10-CM | POA: Diagnosis not present

## 2017-11-09 NOTE — Progress Notes (Signed)
  Pre-Operative Nutrition Class:  Appt start time: 8:15   End time:  9:15.  Patient was seen on 11/09/2017 for Pre-Operative Bariatric Surgery Education at [facility].   Surgery date: TBD Surgery type: Sleeve Start weight at [facility]: 277 Weight today: 268.8   Samples given per MNT protocol. Patient educated on appropriate usage: Bariatric Advantage Multivitamin Lot # [id number] Exp: 11/2018  Bariatric Advantage Calcium Citrate Lot # [id number] Exp:06/18/2018  Bariatric Advantage Calcium Citrate Lot # [id number] Exp:10/10/2018  Bariatric Advantage Calcium Citrate Lot # [id number] Exp:07/09/2018  [place]jury Protein Shake Lot # [id number] Exp: 04/25/2018  The following the learning objectives were met by the patient during this course:  Identify Pre-Op Dietary Goals and will begin 2 weeks pre-operatively  Identify appropriate sources of fluids and proteins   State protein recommendations and appropriate sources pre and post-operatively  Identify Post-Operative Dietary Goals and will follow for 2 weeks post-operatively  Identify appropriate multivitamin and calcium sources  Describe the need for physical activity post-operatively and will follow MD recommendations  State when to call healthcare provider regarding medication questions or post-operative complications  Handouts given during class include:  Pre-Op Bariatric Surgery Diet Handout  Protein Shake Handout  Post-Op Bariatric Surgery Nutrition Handout  BELT Program Information Flyer  Support Group Information Flyer  [facility] Bariatric Supplements Price List  Follow-Up Plan: Patient will follow-up at [facility] 2 weeks post operatively for diet advancement per MD.

## 2017-12-04 ENCOUNTER — Ambulatory Visit: Payer: Self-pay | Admitting: Surgery

## 2017-12-04 NOTE — H&P (Signed)
Erryn C Lapp Documented: 12/04/2017 2:29 PM Location: [facility] Patient #: [id number] DOB: 02/18/1975 Married / Language: English / Race: White Female  History of Present Illness (Chelsea A. Connor MD; 12/04/2017 2:51 PM) Patient words: She is here today for her preoperative visit for laparoscopic sleeve gastrectomy. She is tentatively scheduled for November 5. She has completed the preoperative workup with no barriers identified. She has actually lost about 15 pounds through her work with the dietitians. She has a list of insightful questions prepared today.   Psych- Dr. Saffo 06/17/17- approved, excellent candidate Nutrition: approved, some behavioral issues still to work on CXR 05/01/17: neg UGI 05/01/17: normal, no reflux, no mention of HH Labs: Included H. pylori negative, TSH normal, lipid panel notable only for borderline low HDL cholesterol of 48, CBC unremarkable except for platelet count of 407, just barely over the upper limit of normal; white count was 6.4, hemoglobin 13.8. CMP unremarkable, creatinine 0.83. Folic acid was borderline at 4.0. Remaining labs including hCG, a hemoglobin A1c, iron, PTT, TSH, urinalysis, vitamin B12, T4, and vitamin D were normal  Initial visit 04/03/17: Here today to discuss weight loss surgery. She is interested in the sleeve and has been considering it for the last 6 months or so. There are several people at her job who've had weight loss surgery and have good results.. Her weight gain started about 20 years ago when she was pregnant. She went on depo Provera after this and continued to gain weight. She has tried Slimfast and Atkins diet twice, Vi Shakes and Metabolife which actually put her in the ER due to severe headache symptoms. Each of these attempts she'll lose 15-25 pounds but then re-gain it. She is looking to pursue weight loss in order to avoid developing the medical problems that plague her family had been her dad has  diabetes and hypertension, her grandmother has the same and is nearly blind from the diabetes. She is tearful when describing this. She would also like to feel beautiful again, and wants to be able to run around with her grandchildren if she ever has them. She has a 23-year-old daughter who is healthy other than a history of juvenile granulosa cell tumor of the ovary, and a 20-year-old son who is healthy other than hemachromatosis, recently dx with depression and autism.  history includes thyroid disease, post op nausea and vomiting, panic attacks, palpitations, migraines, depression, anxiety. She takes Protonix every day but does not really have reflux, she states that several years ago she had a sensation that things were sticking in her throat and was started on the Protonix as well as pro-air for this.  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  The patient is a 42 year old female.    Review of Systems (Chelsea A. Connor MD; 12/04/2017 2:51 PM) All other systems negative   Physical Exam (Chelsea A. Connor MD; 12/04/2017 2:52 PM)  The physical exam findings are as follows: Note:Gen: alert and well appearing Eye: extraocular motion intact, no scleral icterus ENT: moist mucus membranes, dentition intact Neck: no mass or thyromegaly Chest: unlabored respirations, symmetrical air entry, clear bilaterally CV: regular rate and rhythm, no pedal edema Abdomen: soft, nontender, nondistended. No mass or organomegaly MSK: strength symmetrical throughout, no deformity Neuro: grossly intact, normal gait Psych: normal mood and affect, appropriate insight Skin:   warm and dry, no rash or lesion on limited exam    Assessment & Plan (Chelsea A. Connor MD; 12/04/2017 2:52 PM)  MORBID OBESITY  (E66.01) Story: She is a good candidate for sleeve gastrectomy. I again discussed the surgery in detail with her. We discussed the typical hospital stay of 1 night, and the typical postoperative recovery. We discussed the risks of surgery including bleeding, infection, pain, scarring, intra-abdominal injury, staple line leak or abscess, chronic nausea or abdominal pain, worsening of reflux, weight regain, incisional hernia. Discussed the general risks of blood clots, heart attack, pneumonia, stroke and death, although very rare. She had several insightful questions all of which were answered. Proceed as planned with sleeve gastrectomy.  Current Plans Started oxyCODONE HCl 5 MG/5ML Oral Solution, 5-10 Milliliter every four hours, as needed, 100 Milliliter, 12/04/2017, No Refill. Started Pantoprazole Sodium 40 MG Oral Tablet Delayed Release, 1 (one) Tablet daily, #30, 30 days starting 12/04/2017, Ref. x3. Started Ondansetron 4 MG Oral Tablet Disintegrating, 1 (one) Tablet every eight hours, as needed, #20, 12/04/2017, No Refill.  

## 2017-12-04 NOTE — H&P (View-Only) (Signed)
Barbara [name] Documented: 12/04/2017 2:29 PM Location: [facility] Patient #: [id number] DOB: [birth year] Married / Language: English / Race: White Female  History of Present Illness (Delmore Sear A. Fredricka Bonine MD; 12/04/2017 2:51 PM) Patient words: She is here today for her preoperative visit for laparoscopic sleeve gastrectomy. She is tentatively scheduled for November 5. She has completed the preoperative workup with no barriers identified. She has actually lost about 15 pounds through her work with the dietitians. She has a list of insightful questions prepared today.   Psych- Dr. Verdell Face 06/17/17- approved, excellent candidate Nutrition: approved, some behavioral issues still to work on CXR 05/01/17: neg UGI 05/01/17: normal, no reflux, no mention of HH Labs: Included H. pylori negative, TSH normal, lipid panel notable only for borderline low HDL cholesterol of 48, CBC unremarkable except for platelet count of 407, just barely over the upper limit of normal; white count was 6.4, hemoglobin 13.8. CMP unremarkable, creatinine 0.83. Folic acid was borderline at 4.0. Remaining labs including hCG, a hemoglobin A1c, iron, PTT, TSH, urinalysis, vitamin B12, T4, and vitamin D were normal  Initial visit 04/03/17: Here today to discuss weight loss surgery. She is interested in the sleeve and has been considering it for the last 6 months or so. There are several people at her job who've had weight loss surgery and have good results.Marland Kitchen Her weight gain started about 20 years ago when she was pregnant. She went on depo Provera after this and continued to gain weight. 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 Each of these attempts she'll lose 15-25 pounds but then re-gain it. She is looking to pursue weight loss in order to avoid developing the medical problems that plague her family had been her dad has  diabetes and hypertension, her grandmother has the same and is nearly blind from the diabetes. She is tearful when describing this. She would also like to feel beautiful again, and wants to be able to run around with her grandchildren if she ever has them. She has a 58 year old daughter who is healthy other than a history of juvenile granulosa cell tumor of the ovary, and a 35 year old son who is healthy other than hemachromatosis, recently dx with depression and autism.  history includes thyroid disease, post op nausea and vomiting, panic attacks, palpitations, migraines, depression, anxiety. She takes Protonix every day but does not really have reflux, she states that several years ago she had a sensation that things were sticking in her throat and was started on the Protonix as well as pro-air for this.  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  The patient is a 42 year old female.    Review of Systems (Lema Heinkel A. Fredricka Bonine MD; 12/04/2017 2:51 PM) All other systems negative   Physical Exam (Arabel Barcenas A. Fredricka Bonine MD; 12/04/2017 2:52 PM)  The physical exam findings are as follows: Note:Gen: alert and well appearing Eye: extraocular motion intact, no scleral icterus ENT: moist mucus membranes, dentition intact Neck: no mass or thyromegaly Chest: unlabored respirations, symmetrical air entry, clear bilaterally CV: regular rate and rhythm, no pedal edema Abdomen: soft, nontender, nondistended. No mass or organomegaly MSK: strength symmetrical throughout, no deformity Neuro: grossly intact, normal gait Psych: normal mood and affect, appropriate insight Skin:  warm and dry, no rash or lesion on limited exam    Assessment & Plan (Laray Corbit A. Fredricka Bonine MD; 12/04/2017 2:52 PM)  MORBID OBESITY  (E66.01) Story: She is a good candidate for sleeve gastrectomy. I again discussed the surgery in detail with her. We discussed the typical hospital stay of 1 night, and the typical postoperative recovery. We discussed the risks of surgery including bleeding, infection, pain, scarring, intra-abdominal injury, staple line leak or abscess, chronic nausea or abdominal pain, worsening of reflux, weight regain, incisional hernia. Discussed the general risks of blood clots, heart attack, pneumonia, stroke and death, although very rare. She had several insightful questions all of which were answered. Proceed as planned with sleeve gastrectomy.  Current Plans Started oxyCODONE HCl 5 MG/5ML Oral Solution, 5-10 Milliliter every four hours, as needed, 100 Milliliter, 12/04/2017, No Refill. Started Pantoprazole Sodium 40 MG Oral Tablet Delayed Release, 1 (one) Tablet daily, #30, 30 days starting 12/04/2017, Ref. x3. Started Ondansetron 4 MG Oral Tablet Disintegrating, 1 (one) Tablet every eight hours, as needed, #20, 12/04/2017, No Refill.

## 2017-12-21 ENCOUNTER — Encounter (HOSPITAL_COMMUNITY): Payer: Self-pay

## 2017-12-21 ENCOUNTER — Other Ambulatory Visit: Payer: Self-pay

## 2017-12-21 ENCOUNTER — Encounter (HOSPITAL_COMMUNITY)
Admission: RE | Admit: 2017-12-21 | Discharge: 2017-12-21 | Disposition: A | Discharge status: Home / Self Care | Payer: 59 | Source: Ambulatory Visit | Attending: Surgery | Admitting: Surgery

## 2017-12-21 DIAGNOSIS — Z6841 Body Mass Index (BMI) 40.0 and over, adult: Secondary | ICD-10-CM

## 2017-12-21 DIAGNOSIS — Z01812 Encounter for preprocedural laboratory examination: Secondary | ICD-10-CM | POA: Insufficient documentation

## 2017-12-21 HISTORY — DX: Morbid (severe) obesity due to excess calories: E66.01

## 2017-12-21 HISTORY — DX: Personal history of other mental and behavioral disorders: Z86.59

## 2017-12-21 HISTORY — DX: Personal history of other specified conditions: Z87.898

## 2017-12-21 LAB — CBC WITH DIFFERENTIAL/PLATELET
Abs Immature Granulocytes: 0.04 10*3/uL (ref 0.00–0.07)
BASOS ABS: 0.1 10*3/uL (ref 0.0–0.1)
Basophils Relative: 1 %
EOS PCT: 1 %
Eosinophils Absolute: 0.1 10*3/uL (ref 0.0–0.5)
HEMATOCRIT: 47.1 % — AB (ref 36.0–46.0)
HEMOGLOBIN: 15.8 g/dL — AB (ref 12.0–15.0)
IMMATURE GRANULOCYTES: 0 %
LYMPHS ABS: 3.7 10*3/uL (ref 0.7–4.0)
LYMPHS PCT: 29 %
MCH: 30 pg (ref 26.0–34.0)
MCHC: 33.5 g/dL (ref 30.0–36.0)
MCV: 89.4 fL (ref 80.0–100.0)
Monocytes Absolute: 0.8 10*3/uL (ref 0.1–1.0)
Monocytes Relative: 6 %
NEUTROS PCT: 63 %
NRBC: 0 % (ref 0.0–0.2)
Neutro Abs: 7.9 10*3/uL — ABNORMAL HIGH (ref 1.7–7.7)
Platelets: 440 10*3/uL — ABNORMAL HIGH (ref 150–400)
RBC: 5.27 MIL/uL — ABNORMAL HIGH (ref 3.87–5.11)
RDW: 12.6 % (ref 11.5–15.5)
WBC: 12.6 10*3/uL — ABNORMAL HIGH (ref 4.0–10.5)

## 2017-12-21 LAB — COMPREHENSIVE METABOLIC PANEL
ALBUMIN: 4.1 g/dL (ref 3.5–5.0)
ALK PHOS: 40 U/L (ref 38–126)
ALT: 21 U/L (ref 0–44)
AST: 26 U/L (ref 15–41)
Anion gap: 9 (ref 5–15)
BILIRUBIN TOTAL: 0.8 mg/dL (ref 0.3–1.2)
BUN: 19 mg/dL (ref 6–20)
CHLORIDE: 104 mmol/L (ref 98–111)
CO2: 24 mmol/L (ref 22–32)
Calcium: 9.5 mg/dL (ref 8.9–10.3)
Creatinine, Ser: 0.9 mg/dL (ref 0.44–1.00)
GFR calc Af Amer: >60 mL/min (ref >60)
GFR calc non Af Amer: >60 mL/min (ref >60)
Glucose, Bld: 91 mg/dL (ref 70–99)
Potassium: 4.1 mmol/L (ref 3.5–5.1)
Sodium: 137 mmol/L (ref 135–145)
Total Protein: 7.3 g/dL (ref 6.5–8.1)

## 2017-12-21 LAB — ABO/RH: ABO/RH(D): A POS

## 2017-12-21 MED ORDER — CHLORHEXIDINE GLUCONATE 4 % EX LIQD
60.0000 mL | Freq: Once | CUTANEOUS | Status: DC
  Filled 2017-12-21: qty 60

## 2017-12-21 MED ORDER — BUPIVACAINE LIPOSOME 1.3 % IJ SUSP: 20.0000 mL | INTRAMUSCULAR | Status: DC

## 2017-12-21 NOTE — Anesthesia Preprocedure Evaluation (Addendum)
Anesthesia Evaluation  Patient identified by MRN, date of birth, ID band Patient awake    Reviewed: Allergy & Precautions, NPO status , Patient's Chart, lab work & pertinent test results  History of Anesthesia Complications (+) PONV and history of anesthetic complications  Airway Mallampati: II  TM Distance: >3 FB Neck ROM: Full    Dental  (+) Dental Advisory Given, Teeth Intact   Pulmonary neg pulmonary ROS,    breath sounds clear to auscultation       Cardiovascular negative cardio ROS   Rhythm:Regular Rate:Normal     Neuro/Psych  Headaches, PSYCHIATRIC DISORDERS Anxiety Depression    GI/Hepatic negative GI ROS, Neg liver ROS,   Endo/Other  Hypothyroidism Morbid obesity  Renal/GU negative Renal ROS     Musculoskeletal negative musculoskeletal ROS (+)   Abdominal (+) + obese,   Peds  Hematology negative hematology ROS (+)   Anesthesia Other Findings   Reproductive/Obstetrics                            Anesthesia Physical Anesthesia Plan  ASA: III  Anesthesia Plan: General   Post-op Pain Management:    Induction: Intravenous  PONV Risk Score and Plan: 4 or greater and Ondansetron, Scopolamine patch - Pre-op, Midazolam, Treatment may vary due to age or medical condition, Dexamethasone and Propofol infusion  Airway Management Planned: Oral ETT  Additional Equipment: None  Intra-op Plan:   Post-operative Plan: Extubation in OR  Informed Consent: I have reviewed the patients History and Physical, chart, labs and discussed the procedure including the risks, benefits and alternatives for the proposed anesthesia with the patient or authorized representative who has indicated his/her understanding and acceptance.   Dental advisory given  Plan Discussed with: CRNA and Anesthesiologist  Anesthesia Plan Comments:        Anesthesia Quick Evaluation

## 2017-12-21 NOTE — Patient Instructions (Addendum)
Barbara [name]  03-22-1975    Your procedure is scheduled on:  12-22-2017     Report to [facility] Main  Entrance,  Report to admitting at  5:30 AM     Call this number if you have problems the morning of surgery [phone]      Remember: NO SOLID FOOD AFTER MIDNIGHT THE NIGHT PRIOR TO SURGERY.  NOTHING BY MOUTH EXCEPT CLEAR LIQUID DIET FROM MIDNIGHT UNTIL 4:30 AM ,3 HOURS PRIOR TO SCHEDULED                                      SURGERY. PLEASE FINISH ENSURE DRINK PER SURGEON ORDER 3 HOURS PRIOR TO SCHEDULED SURGERY TIME WHICH NEEDS TO BE COMPLETED AT ___4:30 AM___.               BRUSH YOUR TEETH MORNING OF SURGERY AND RINSE YOUR MOUTH OUT, NO CHEWING GUM CANDY OR MINTS.        Take these medicines the morning of surgery with A SIP OF WATER:  Zyrtec, Alprazolam (xanax) if needed,  ProAir Inhaler if needed and bring with you day of surgery                                  You may not have any metal on your body including hair pins and piercings              Do not wear jewelry, make-up, lotions, powders or perfumes, deodorant              Do not wear nail polish.  Do not shave  48 hours prior to surgery.       Do not bring valuables to the hospital. [facility] IS NOT             RESPONSIBLE   FOR VALUABLES.  Contacts, dentures or bridgework may not be worn into surgery.  Leave suitcase in the car. After surgery it may be brought to your room.      Special Instructions:  Per Dr Fredricka Bonine order, you should have stopped estrogens and progestrones four weeks ago             _____________________________________________________________________             [facility] - Preparing for Surgery Before surgery, you can play an important role.  Because skin is not sterile, your skin needs to be as free of germs as possible.  You can reduce the number of germs on your skin by washing with CHG (chlorahexidine gluconate) soap before surgery.  CHG is  an antiseptic cleaner which kills germs and bonds with the skin to continue killing germs even after washing. Please DO NOT use if you have an allergy to CHG or antibacterial soaps.  If your skin becomes reddened/irritated stop using the CHG and inform your nurse when you arrive at Short Stay. Do not shave (including legs and underarms) for at least 48 hours prior to the first CHG shower.  You may shave your face/neck. Please follow these instructions carefully:  1.  Shower with CHG Soap the night before surgery and the  morning of Surgery.  2.  If you choose to wash your hair, wash your hair first as  usual with your  normal  shampoo.  3.  After you shampoo, rinse your hair and body thoroughly to remove the  shampoo.                            4.  Use CHG as you would any other liquid soap.  You can apply chg directly  to the skin and wash                       Gently with a scrungie or clean washcloth.  5.  Apply the CHG Soap to your body ONLY FROM THE NECK DOWN.   Do not use on face/ open                           Wound or open sores. Avoid contact with eyes, ears mouth and genitals (private parts).                       Wash face,  Genitals (private parts) with your normal soap.             6.  Wash thoroughly, paying special attention to the area where your surgery  will be performed.  7.  Thoroughly rinse your body with warm water from the neck down.  8.  DO NOT shower/wash with your normal soap after using and rinsing off  the CHG Soap.             9.  Pat yourself dry with a clean towel.            10.  Wear clean pajamas.            11.  Place clean sheets on your bed the night of your first shower and do not  sleep with pets. Day of Surgery : Do not apply any lotions/deodorants the morning of surgery.  Please wear clean clothes to the hospital/surgery center.  FAILURE TO FOLLOW THESE INSTRUCTIONS MAY RESULT IN THE CANCELLATION OF YOUR SURGERY PATIENT  SIGNATURE_________________________________  NURSE SIGNATURE__________________________________  ________________________________________________________________________   Rogelia Mire  An incentive spirometer is a tool that can help keep your lungs clear and active. This tool measures how well you are filling your lungs with each breath. Taking long deep breaths may help reverse or decrease the chance of developing breathing (pulmonary) problems (especially infection) following:  A long period of time when you are unable to move or be active. BEFORE THE PROCEDURE   If the spirometer includes an indicator to show your best effort, your nurse or respiratory therapist will set it to a desired goal.  If possible, sit up straight or lean slightly forward. Try not to slouch.  Hold the incentive spirometer in an upright position. INSTRUCTIONS FOR USE  1. Sit on the edge of your bed if possible, or sit up as far as you can in bed or on a chair. 2. Hold the incentive spirometer in an upright position. 3. Breathe out normally. 4. Place the mouthpiece in your mouth and seal your lips tightly around it. 5. Breathe in slowly and as deeply as possible, raising the piston or the ball toward the top of the column. 6. Hold your breath for 3-5 seconds or for as long as possible. Allow the piston or ball to fall to the bottom of the column. 7. Remove the mouthpiece from your mouth  and breathe out normally. 8. Rest for a few seconds and repeat Steps 1 through 7 at least 10 times every 1-2 hours when you are awake. Take your time and take a few normal breaths between deep breaths. 9. The spirometer may include an indicator to show your best effort. Use the indicator as a goal to work toward during each repetition. 10. After each set of 10 deep breaths, practice coughing to be sure your lungs are clear. If you have an incision (the cut made at the time of surgery), support your incision when coughing  by placing a pillow or rolled up towels firmly against it. Once you are able to get out of bed, walk around indoors and cough well. You may stop using the incentive spirometer when instructed by your caregiver.  RISKS AND COMPLICATIONS  Take your time so you do not get dizzy or light-headed.  If you are in pain, you may need to take or ask for pain medication before doing incentive spirometry. It is harder to take a deep breath if you are having pain. AFTER USE  Rest and breathe slowly and easily.  It can be helpful to keep track of a log of your progress. Your caregiver can provide you with a simple table to help with this. If you are using the spirometer at home, follow these instructions: SEEK MEDICAL CARE IF:   You are having difficultly using the spirometer.  You have trouble using the spirometer as often as instructed.  Your pain medication is not giving enough relief while using the spirometer.  You develop fever of 100.5 F (38.1 C) or higher. SEEK IMMEDIATE MEDICAL CARE IF:   You cough up bloody sputum that had not been present before.  You develop fever of 102 F (38.9 C) or greater.  You develop worsening pain at or near the incision site. MAKE SURE YOU:   Understand these instructions.  Will watch your condition.  Will get help right away if you are not doing well or get worse. Document Released: 06/16/2006 Document Revised: 04/28/2011 Document Reviewed: 08/17/2006 [facility] Patient Information 2014 [place], [place].   ________________________________________________________________________

## 2017-12-22 ENCOUNTER — Encounter (HOSPITAL_COMMUNITY): Payer: Self-pay

## 2017-12-22 ENCOUNTER — Inpatient Hospital Stay (HOSPITAL_COMMUNITY): Payer: 59 | Admitting: Anesthesiology

## 2017-12-22 ENCOUNTER — Inpatient Hospital Stay (HOSPITAL_COMMUNITY)
Admission: RE | Admit: 2017-12-22 | Discharge: 2017-12-23 | DRG: 621 | Disposition: A | Discharge status: Home / Self Care | Payer: 59 | Attending: Surgery | Admitting: Surgery

## 2017-12-22 ENCOUNTER — Encounter (HOSPITAL_COMMUNITY)
Admission: RE | Disposition: A | Discharge status: Home / Self Care | Payer: Self-pay | Source: Home / Self Care | Attending: Surgery

## 2017-12-22 DIAGNOSIS — F329 Major depressive disorder, single episode, unspecified: Secondary | ICD-10-CM | POA: Diagnosis present

## 2017-12-22 DIAGNOSIS — G43909 Migraine, unspecified, not intractable, without status migrainosus: Secondary | ICD-10-CM | POA: Diagnosis present

## 2017-12-22 DIAGNOSIS — E039 Hypothyroidism, unspecified: Secondary | ICD-10-CM | POA: Diagnosis present

## 2017-12-22 DIAGNOSIS — F419 Anxiety disorder, unspecified: Secondary | ICD-10-CM | POA: Diagnosis present

## 2017-12-22 DIAGNOSIS — Z6841 Body Mass Index (BMI) 40.0 and over, adult: Secondary | ICD-10-CM | POA: Diagnosis not present

## 2017-12-22 HISTORY — PX: LAPAROSCOPIC GASTRIC SLEEVE RESECTION: SHX5895

## 2017-12-22 LAB — TYPE AND SCREEN
ABO/RH(D): A POS
ANTIBODY SCREEN: NEGATIVE

## 2017-12-22 LAB — PREGNANCY, URINE: PREG TEST UR: NEGATIVE

## 2017-12-22 SURGERY — GASTRECTOMY, SLEEVE, LAPAROSCOPIC
Anesthesia: General

## 2017-12-22 MED ORDER — PHENYLEPHRINE HCL 10 MG/ML IJ SOLN
INTRAMUSCULAR | Status: DC | PRN
  Administered 2017-12-22 (×3): 40 ug via INTRAVENOUS
  Administered 2017-12-22 (×3): 80 ug via INTRAVENOUS

## 2017-12-22 MED ORDER — PROPOFOL 500 MG/50ML IV EMUL
INTRAVENOUS | Status: DC | PRN
  Administered 2017-12-22: 100 ug/kg/min via INTRAVENOUS

## 2017-12-22 MED ORDER — LACTATED RINGERS IV SOLN
INTRAVENOUS | Status: DC | PRN
  Administered 2017-12-22 (×2): via INTRAVENOUS

## 2017-12-22 MED ORDER — MIDAZOLAM HCL 5 MG/5ML IJ SOLN
INTRAMUSCULAR | Status: DC | PRN
  Administered 2017-12-22 (×2): 1 mg via INTRAVENOUS

## 2017-12-22 MED ORDER — PROPOFOL 10 MG/ML IV BOLUS
INTRAVENOUS | Status: AC
  Filled 2017-12-22: qty 20

## 2017-12-22 MED ORDER — METOPROLOL TARTRATE 5 MG/5ML IV SOLN: 5.0000 mg | Freq: Four times a day (QID) | INTRAVENOUS | Status: DC | PRN

## 2017-12-22 MED ORDER — GABAPENTIN 100 MG PO CAPS
200.0000 mg | ORAL_CAPSULE | Freq: Two times a day (BID) | ORAL | Status: DC
  Administered 2017-12-22 – 2017-12-23 (×2): 200 mg via ORAL
  Filled 2017-12-22 (×2): qty 2

## 2017-12-22 MED ORDER — PROMETHAZINE HCL 25 MG/ML IJ SOLN: 6.2500 mg | INTRAMUSCULAR | Status: DC | PRN

## 2017-12-22 MED ORDER — OXYCODONE HCL 5 MG PO TABS: 5.0000 mg | ORAL_TABLET | Freq: Once | ORAL | Status: DC | PRN

## 2017-12-22 MED ORDER — HYDRALAZINE HCL 20 MG/ML IJ SOLN: 10.0000 mg | INTRAMUSCULAR | Status: DC | PRN

## 2017-12-22 MED ORDER — ENOXAPARIN SODIUM 30 MG/0.3ML ~~LOC~~ SOLN
30.0000 mg | Freq: Two times a day (BID) | SUBCUTANEOUS | Status: DC
  Administered 2017-12-22 – 2017-12-23 (×2): 30 mg via SUBCUTANEOUS
  Filled 2017-12-22 (×2): qty 0.3

## 2017-12-22 MED ORDER — FENTANYL CITRATE (PF) 100 MCG/2ML IJ SOLN
INTRAMUSCULAR | Status: AC
  Filled 2017-12-22: qty 2

## 2017-12-22 MED ORDER — SIMETHICONE 80 MG PO CHEW: 80.0000 mg | CHEWABLE_TABLET | Freq: Four times a day (QID) | ORAL | Status: DC | PRN

## 2017-12-22 MED ORDER — CELECOXIB 200 MG PO CAPS
400.0000 mg | ORAL_CAPSULE | ORAL | Status: AC
  Administered 2017-12-22: 400 mg via ORAL
  Filled 2017-12-22: qty 2

## 2017-12-22 MED ORDER — LIDOCAINE 20MG/ML (2%) 15 ML SYRINGE OPTIME
INTRAMUSCULAR | Status: DC | PRN
  Administered 2017-12-22: 1 mg/kg/h via INTRAVENOUS

## 2017-12-22 MED ORDER — CLINDAMYCIN PHOSPHATE 900 MG/50ML IV SOLN
INTRAVENOUS | Status: DC | PRN
  Administered 2017-12-22: 900 mg via INTRAVENOUS

## 2017-12-22 MED ORDER — FENTANYL CITRATE (PF) 100 MCG/2ML IJ SOLN
INTRAMUSCULAR | Status: AC
  Filled 2017-12-22: qty 4

## 2017-12-22 MED ORDER — DEXAMETHASONE SODIUM PHOSPHATE 4 MG/ML IJ SOLN: 4.0000 mg | INTRAMUSCULAR | Status: DC

## 2017-12-22 MED ORDER — PANTOPRAZOLE SODIUM 40 MG IV SOLR
40.0000 mg | Freq: Every day | INTRAVENOUS | Status: DC
  Administered 2017-12-22: 40 mg via INTRAVENOUS
  Filled 2017-12-22: qty 40

## 2017-12-22 MED ORDER — PROPOFOL 10 MG/ML IV BOLUS
INTRAVENOUS | Status: DC | PRN
  Administered 2017-12-22: 200 mg via INTRAVENOUS

## 2017-12-22 MED ORDER — SUGAMMADEX SODIUM 500 MG/5ML IV SOLN
INTRAVENOUS | Status: DC | PRN
  Administered 2017-12-22: 300 mg via INTRAVENOUS

## 2017-12-22 MED ORDER — METHOCARBAMOL 500 MG PO TABS: 500.0000 mg | ORAL_TABLET | Freq: Four times a day (QID) | ORAL | Status: DC | PRN

## 2017-12-22 MED ORDER — BUPIVACAINE HCL (PF) 0.25 % IJ SOLN
INTRAMUSCULAR | Status: DC | PRN
  Administered 2017-12-22: 30 mL

## 2017-12-22 MED ORDER — ONDANSETRON HCL 4 MG/2ML IJ SOLN: 4.0000 mg | INTRAMUSCULAR | Status: DC | PRN

## 2017-12-22 MED ORDER — HYDROMORPHONE HCL 1 MG/ML IJ SOLN: 0.5000 mg | INTRAMUSCULAR | Status: DC | PRN

## 2017-12-22 MED ORDER — ACETAMINOPHEN 160 MG/5ML PO SOLN
650.0000 mg | Freq: Four times a day (QID) | ORAL | Status: DC
  Administered 2017-12-22 – 2017-12-23 (×3): 650 mg via ORAL
  Filled 2017-12-22 (×3): qty 20.3

## 2017-12-22 MED ORDER — 0.9 % SODIUM CHLORIDE (POUR BTL) OPTIME
TOPICAL | Status: DC | PRN
  Administered 2017-12-22: 1000 mL

## 2017-12-22 MED ORDER — ACETAMINOPHEN 500 MG PO TABS
1000.0000 mg | ORAL_TABLET | ORAL | Status: AC
  Administered 2017-12-22: 1000 mg via ORAL
  Filled 2017-12-22: qty 2

## 2017-12-22 MED ORDER — FENTANYL CITRATE (PF) 100 MCG/2ML IJ SOLN
25.0000 ug | INTRAMUSCULAR | Status: DC | PRN
  Administered 2017-12-22 (×3): 50 ug via INTRAVENOUS

## 2017-12-22 MED ORDER — FENTANYL CITRATE (PF) 100 MCG/2ML IJ SOLN
INTRAMUSCULAR | Status: DC | PRN
  Administered 2017-12-22: 100 ug via INTRAVENOUS

## 2017-12-22 MED ORDER — SCOPOLAMINE 1 MG/3DAYS TD PT72
1.0000 | MEDICATED_PATCH | TRANSDERMAL | Status: DC
  Administered 2017-12-22: 1.5 mg via TRANSDERMAL
  Filled 2017-12-22: qty 1

## 2017-12-22 MED ORDER — LACTATED RINGERS IR SOLN
Status: DC | PRN
  Administered 2017-12-22: 1000 mL

## 2017-12-22 MED ORDER — BUPIVACAINE LIPOSOME 1.3 % IJ SUSP
20.0000 mL | INTRAMUSCULAR | Status: DC
  Filled 2017-12-22: qty 20

## 2017-12-22 MED ORDER — LACTATED RINGERS IV SOLN
INTRAVENOUS | Status: DC
  Administered 2017-12-22: 06:00:00 via INTRAVENOUS

## 2017-12-22 MED ORDER — DOCUSATE SODIUM 100 MG PO CAPS
100.0000 mg | ORAL_CAPSULE | Freq: Two times a day (BID) | ORAL | Status: DC
  Administered 2017-12-22 – 2017-12-23 (×2): 100 mg via ORAL
  Filled 2017-12-22 (×2): qty 1

## 2017-12-22 MED ORDER — ELETRIPTAN HYDROBROMIDE 40 MG PO TABS
40.0000 mg | ORAL_TABLET | ORAL | Status: DC | PRN
  Filled 2017-12-22: qty 1

## 2017-12-22 MED ORDER — ROCURONIUM BROMIDE 100 MG/10ML IV SOLN
INTRAVENOUS | Status: DC | PRN
  Administered 2017-12-22: 10 mg via INTRAVENOUS
  Administered 2017-12-22: 60 mg via INTRAVENOUS

## 2017-12-22 MED ORDER — BUPIVACAINE HCL (PF) 0.25 % IJ SOLN
INTRAMUSCULAR | Status: AC
  Filled 2017-12-22: qty 30

## 2017-12-22 MED ORDER — PROPOFOL 10 MG/ML IV BOLUS
INTRAVENOUS | Status: AC
  Filled 2017-12-22: qty 60

## 2017-12-22 MED ORDER — STERILE WATER FOR IRRIGATION IR SOLN
Status: DC | PRN
  Administered 2017-12-22: 1000 mL

## 2017-12-22 MED ORDER — BUPIVACAINE LIPOSOME 1.3 % IJ SUSP
INTRAMUSCULAR | Status: DC | PRN
  Administered 2017-12-22: 20 mL

## 2017-12-22 MED ORDER — LIDOCAINE HCL (CARDIAC) PF 100 MG/5ML IV SOSY
PREFILLED_SYRINGE | INTRAVENOUS | Status: DC | PRN
  Administered 2017-12-22: 50 mg via INTRAVENOUS

## 2017-12-22 MED ORDER — EPHEDRINE SULFATE 50 MG/ML IJ SOLN
INTRAMUSCULAR | Status: DC | PRN
  Administered 2017-12-22: 10 mg via INTRAVENOUS
  Administered 2017-12-22: 5 mg via INTRAVENOUS

## 2017-12-22 MED ORDER — OXYCODONE HCL 5 MG/5ML PO SOLN
5.0000 mg | Freq: Once | ORAL | Status: DC | PRN
  Filled 2017-12-22: qty 5

## 2017-12-22 MED ORDER — ALBUTEROL SULFATE (2.5 MG/3ML) 0.083% IN NEBU: 2.5000 mg | INHALATION_SOLUTION | RESPIRATORY_TRACT | Status: DC | PRN

## 2017-12-22 MED ORDER — ENOXAPARIN SODIUM 40 MG/0.4ML ~~LOC~~ SOLN
40.0000 mg | SUBCUTANEOUS | Status: AC
  Administered 2017-12-22: 40 mg via SUBCUTANEOUS
  Filled 2017-12-22: qty 0.4

## 2017-12-22 MED ORDER — MIDAZOLAM HCL 2 MG/2ML IJ SOLN
INTRAMUSCULAR | Status: AC
  Filled 2017-12-22: qty 2

## 2017-12-22 MED ORDER — GABAPENTIN 300 MG PO CAPS
300.0000 mg | ORAL_CAPSULE | ORAL | Status: AC
  Administered 2017-12-22: 300 mg via ORAL
  Filled 2017-12-22: qty 1

## 2017-12-22 MED ORDER — SODIUM CHLORIDE 0.9 % IV SOLN
INTRAVENOUS | Status: DC
  Administered 2017-12-22 – 2017-12-23 (×3): via INTRAVENOUS

## 2017-12-22 MED ORDER — ONDANSETRON HCL 4 MG/2ML IJ SOLN
INTRAMUSCULAR | Status: DC | PRN
  Administered 2017-12-22: 4 mg via INTRAVENOUS

## 2017-12-22 MED ORDER — GENTAMICIN SULFATE 40 MG/ML IJ SOLN
130.0000 mg | INTRAVENOUS | Status: AC
  Administered 2017-12-22: 130 mg via INTRAVENOUS
  Filled 2017-12-22: qty 3.25

## 2017-12-22 MED ORDER — APREPITANT 40 MG PO CAPS
40.0000 mg | ORAL_CAPSULE | ORAL | Status: AC
  Administered 2017-12-22: 40 mg via ORAL
  Filled 2017-12-22: qty 1

## 2017-12-22 MED ORDER — OXYCODONE HCL 5 MG/5ML PO SOLN: 5.0000 mg | ORAL | Status: DC | PRN

## 2017-12-22 MED ORDER — PREMIER PROTEIN SHAKE
2.0000 [oz_av] | ORAL | Status: DC
  Administered 2017-12-23 (×2): 2 [oz_av] via ORAL

## 2017-12-22 SURGICAL SUPPLY — 79 items
APL SKNCLS STERI-STRIP NONHPOA (GAUZE/BANDAGES/DRESSINGS) ×1
APL SWBSTK 6 STRL LF DISP (MISCELLANEOUS)
APPLICATOR COTTON TIP 6 STRL (MISCELLANEOUS) IMPLANT
APPLICATOR COTTON TIP 6IN STRL (MISCELLANEOUS)
APPLIER CLIP 5 13 M/L LIGAMAX5 (MISCELLANEOUS) ×2
APPLIER CLIP ROT 10 11.4 M/L (STAPLE)
APPLIER CLIP ROT 13.4 12 LRG (CLIP)
APR CLP LRG 13.4X12 ROT 20 MLT (CLIP)
APR CLP MED LRG 11.4X10 (STAPLE)
APR CLP MED LRG 5 ANG JAW (MISCELLANEOUS) ×1
BAG LAPAROSCOPIC 12 15 PORT 16 (BASKET) IMPLANT
BAG RETRIEVAL 12/15 (BASKET) ×2
BANDAGE ADH SHEER 1  50/CT (GAUZE/BANDAGES/DRESSINGS) ×12 IMPLANT
BENZOIN TINCTURE PRP APPL 2/3 (GAUZE/BANDAGES/DRESSINGS) ×2 IMPLANT
BLADE SURG SZ11 CARB STEEL (BLADE) ×2 IMPLANT
CABLE HIGH FREQUENCY MONO STRZ (ELECTRODE) ×2 IMPLANT
CHLORAPREP W/TINT 26ML (MISCELLANEOUS) ×4 IMPLANT
CLIP APPLIE 5 13 M/L LIGAMAX5 (MISCELLANEOUS) IMPLANT
CLIP APPLIE ROT 10 11.4 M/L (STAPLE) IMPLANT
CLIP APPLIE ROT 13.4 12 LRG (CLIP) IMPLANT
COVER SURGICAL LIGHT HANDLE (MISCELLANEOUS) ×2 IMPLANT
COVER WAND RF STERILE (DRAPES) ×1 IMPLANT
DECANTER SPIKE VIAL GLASS SM (MISCELLANEOUS) ×2 IMPLANT
DEVICE SUT QUICK LOAD TK 5 (STAPLE) IMPLANT
DEVICE SUT TI-KNOT TK 5X26 (MISCELLANEOUS) IMPLANT
DRAPE UTILITY XL STRL (DRAPES) ×4 IMPLANT
ELECT REM PT RETURN 15FT ADLT (MISCELLANEOUS) ×2 IMPLANT
GAUZE SPONGE 4X4 12PLY STRL (GAUZE/BANDAGES/DRESSINGS) IMPLANT
GLOVE BIOGEL PI IND STRL 6.5 (GLOVE) IMPLANT
GLOVE BIOGEL PI IND STRL 7.0 (GLOVE) IMPLANT
GLOVE BIOGEL PI INDICATOR 6.5 (GLOVE) ×1
GLOVE BIOGEL PI INDICATOR 7.0 (GLOVE) ×4
GLOVE SURG SS PI 6.0 STRL IVOR (GLOVE) ×1 IMPLANT
GLOVE SURG SS PI 6.5 STRL IVOR (GLOVE) ×1 IMPLANT
GLOVE SURG SS PI 7.0 STRL IVOR (GLOVE) ×1 IMPLANT
GOWN STRL REUS W/TWL LRG LVL3 (GOWN DISPOSABLE) ×3 IMPLANT
GOWN STRL REUS W/TWL XL LVL3 (GOWN DISPOSABLE) ×4 IMPLANT
GRASPER SUT TROCAR 14GX15 (MISCELLANEOUS) ×2 IMPLANT
HOVERMATT SINGLE USE (MISCELLANEOUS) ×2 IMPLANT
KIT BASIN OR (CUSTOM PROCEDURE TRAY) ×2 IMPLANT
MARKER SKIN DUAL TIP RULER LAB (MISCELLANEOUS) ×2 IMPLANT
NDL SPNL 22GX3.5 QUINCKE BK (NEEDLE) ×1 IMPLANT
NEEDLE SPNL 22GX3.5 QUINCKE BK (NEEDLE) ×2 IMPLANT
PACK UNIVERSAL I (CUSTOM PROCEDURE TRAY) ×2 IMPLANT
RELOAD ENDO STITCH (ENDOMECHANICALS) IMPLANT
RELOAD STAPLE 60 3.6 BLU REG (STAPLE) ×1 IMPLANT
RELOAD STAPLE 60 3.8 GOLD REG (STAPLE) ×1 IMPLANT
RELOAD STAPLE 60 4.1 GRN THCK (STAPLE) IMPLANT
RELOAD STAPLER BLUE 60MM (STAPLE) ×3 IMPLANT
RELOAD STAPLER GOLD 60MM (STAPLE) ×1 IMPLANT
RELOAD STAPLER GREEN 60MM (STAPLE) ×1 IMPLANT
RELOAD SUT TRIPLE-STITCH 2-0 (ENDOMECHANICALS) IMPLANT
SCISSORS LAP 5X45 EPIX DISP (ENDOMECHANICALS) ×2 IMPLANT
SET IRRIG TUBING LAPAROSCOPIC (IRRIGATION / IRRIGATOR) ×2 IMPLANT
SHEARS HARMONIC ACE PLUS 45CM (MISCELLANEOUS) ×2 IMPLANT
SLEEVE ADV FIXATION 5X100MM (TROCAR) ×4 IMPLANT
SLEEVE GASTRECTOMY 40FR VISIGI (MISCELLANEOUS) ×2 IMPLANT
SOLUTION ANTI FOG 6CC (MISCELLANEOUS) ×2 IMPLANT
SPONGE LAP 18X18 RF (DISPOSABLE) ×2 IMPLANT
STAPLER ECHELON BIOABSB 60 FLE (MISCELLANEOUS) ×9 IMPLANT
STAPLER ECHELON LONG 60 440 (INSTRUMENTS) ×2 IMPLANT
STAPLER RELOAD BLUE 60MM (STAPLE) ×6
STAPLER RELOAD GOLD 60MM (STAPLE) ×2
STAPLER RELOAD GREEN 60MM (STAPLE) ×2
STRIP CLOSURE SKIN 1/2X4 (GAUZE/BANDAGES/DRESSINGS) ×2 IMPLANT
SUT MNCRL AB 4-0 PS2 18 (SUTURE) ×2 IMPLANT
SUT SURGIDAC NAB ES-9 0 48 120 (SUTURE) IMPLANT
SUT VICRYL 0 TIES 12 18 (SUTURE) ×2 IMPLANT
SYR 10ML ECCENTRIC (SYRINGE) ×2 IMPLANT
SYR 20CC LL (SYRINGE) ×2 IMPLANT
SYR 50ML LL SCALE MARK (SYRINGE) ×2 IMPLANT
TOWEL OR 17X26 10 PK STRL BLUE (TOWEL DISPOSABLE) ×2 IMPLANT
TOWEL OR NON WOVEN STRL DISP B (DISPOSABLE) ×2 IMPLANT
TROCAR ADV FIXATION 5X100MM (TROCAR) ×2 IMPLANT
TROCAR BLADELESS 15MM (ENDOMECHANICALS) ×2 IMPLANT
TROCAR BLADELESS OPT 5 100 (ENDOMECHANICALS) ×2 IMPLANT
TUBING CONNECTING 10 (TUBING) ×2 IMPLANT
TUBING ENDO SMARTCAP (MISCELLANEOUS) ×2 IMPLANT
TUBING INSUF HEATED (TUBING) ×2 IMPLANT

## 2017-12-22 NOTE — Op Note (Signed)
Operative Note  Barbara [name]  [id number]  [id number]  12/22/2017   Surgeon: Lady Deutscher ConnorMD FACS  Assistant: Feliciana Rossetti MD FACS  Procedure performed: laparoscopic sleeve gastrectomy, upper endoscopy  Preop diagnosis: Morbid obesity Body mass index is 41.16 kg/m., migraines, depression, anxiety Post-op diagnosis/intraop findings: same  Specimens: fundus Retained items: none EBL: minimal cc Complications: none  Description of procedure: After obtaining informed consent and administration of prophylactic lovenox in holding, the patient was taken to the operating room and placed supine on operating room table wheregeneral endotracheal anesthesia was initiated, preoperative antibiotics were administered, SCDs applied, and a formal timeout was performed. The abdomen was prepped and draped in usual sterile fashion. Peritoneal access was gained using a Visiport technique in the left upper quadrant and insufflation to 15 mmHg ensued without issue. Gross inspection revealed no evidence of injury. Under direct visualization three more 5 mm trochars were placed in the right and left hemiabdomen and the 15mm trocar in the right paramedian upper abdomen. Bilateral laparoscopic assisted TAPS blocks were performed with Exparel diluted with 0.25 percent Marcaine with epinephrine. The patient was placed in steep Trendelenburg and the liver retractor was introduced through an incision in the upper midline and secured to the post externally to maintain the left lobe retracted anteriorly. Using the Harmonic scalpel, the greater curvature of the stomach was dissected away from the greater omentum and short gastric vessels were divided. This began 6 cm from the pylorus, and dissection proceeded until the left crus was clearly exposed. The 50 [place] VisiGi was then introduced and directed down towards the pylorus. This was placed to suction against the lesser curve. Serial fires of the linear cutting  stapler with seamguards were then employed to create our sleeve. The first fire used a green load and ensured adequate room at the angularis incisura. One gold load and then 3 blue loads were then employed to create a narrow tubular stomach all the way up to the angle of His. The excised stomach was then removed through our 15 mm trocar site within an Endo Catch bag. The visigi was taken off of suction and a few puffs of air were introduced, inflating the sleeve. No bubbles were observed in the irrigation fluid around the stomach and the shape was noted to be a nice smooth tube without any narrowing at the angularis. The visigi was then removed. Upper endoscopy was performed by the assistant surgeon and the sleeve was noted to be airtight, the staple line was hemostatic. Please see his separate note. The endoscope was removed. A minimal amount of oozing on the distal staple line was addressed with clips.  The 15 mm trocar site fascia in the right upper abdomen was closed with 2 interrupted sutures of 0 Vicryl using the laparoscopic suture passer under direct visualization. The liver retractor was removed under direct visualization. The abdomen was then desufflated and all remaining trochars removed. The skin incisions were closed with running subcuticular Monocryl; benzoin, Steri-Strips and Band-Aids were applied The patient was then awakened, extubated and taken to PACU in stable condition.    All counts were correct at the completion of the case.

## 2017-12-22 NOTE — Progress Notes (Signed)
PHARMACY CONSULT FOR:  Risk Assessment for Post-Discharge VTE Following Bariatric Surgery  Post-Discharge VTE Risk Assessment: This patient's probability of 30-day post-discharge VTE is increased due to the factors marked:   Female    Age >/=60 years    BMI >/=50 kg/m2    CHF    Dyspnea at Rest    Paraplegia  X Non-gastric-band surgery    Operation Time >/=3 hr    Return to OR     Length of Stay >/= 3 d   Predicted probability of 30-day post-discharge VTE: 0.16%  Other patient-specific factors to consider: none  Recommendation for Discharge:  No pharmacologic prophylaxis post-discharge  Follow daily for new risk factors that could alter need for prophylaxis   Barbara [name] is a 42 y.o. female who underwent  laparoscopic sleeve gastrectomy on 11/5   Case start: 0739 Case end: 0848   Allergies  Allergen Reactions  . Sulfamethoxazole-Trimethoprim Nausea And Vomiting and Rash  . Penicillins Hives    Tolerates cephalosporins. Has patient had a PCN reaction causing immediate rash, facial/tongue/throat swelling, SOB or lightheadedness with hypotension: No Has patient had a PCN reaction causing severe rash involving mucus membranes or skin necrosis: No Has patient had a PCN reaction that required hospitalization: No Has patient had a PCN reaction occurring within the last 10 years: No If all of the above answers are "NO", then may proceed with Cephalosporin use.   . Prednisone Hives  . Latex Rash    Some gloves and tape cause red and raw skin. Skin peels off.     Patient Measurements: Height: 5' 6.5" (168.9 cm) Weight: 258 lb 14.4 oz (117.4 kg) IBW/kg (Calculated) : 60.45 Body mass index is 41.16 kg/m.  Recent Labs    12/21/17 1615  WBC 12.6*  HGB 15.8*  HCT 47.1*  PLT 440*  CREATININE 0.90  ALBUMIN 4.1  PROT 7.3  AST 26  ALT 21  ALKPHOS 40  BILITOT 0.8   Estimated Creatinine Clearance: 104.9 mL/min (by C-G formula based on SCr of 0.9 mg/dL).    Past  Medical History:  Diagnosis Date  . Anxiety   . Depression   . History of palpitations 2011   work-up with cardiologist with event monitor, showed no arrhythmias  . History of panic attacks   . Migraine headache   . Morbid obesity (HCC)   . PONV (postoperative nausea and vomiting)      Medications Prior to Admission  Medication Sig Dispense Refill Last Dose  . ALLEGRA-D ALLERGY & CONGESTION 180-240 MG 24 hr tablet Take 1 tablet by mouth daily as needed for allergies.  3 12/21/2017 at Unknown time  . calcium-vitamin D (OSCAL WITH D) 500-200 MG-UNIT tablet Take 1 tablet by mouth 3 (three) times daily.   12/21/2017 at Unknown time  . cetirizine (ZYRTEC) 10 MG tablet Take 10 mg by mouth daily.   Past Week at Unknown time  . Cholecalciferol (HM VITAMIN D3) 4000 units CAPS Take 4,000 Units by mouth daily.   12/21/2017 at Unknown time  . Multiple Vitamins-Minerals (BARIATRIC MULTIVITAMINS/IRON PO) Take 1 tablet by mouth 2 (two) times daily.   Past Week at Unknown time  . ALPRAZolam (XANAX) 0.25 MG tablet Take 1 tablet (0.25 mg total) by mouth 2 (two) times daily as needed for anxiety. (Patient not taking: Reported on 12/17/2017) 60 tablet 1 Not Taking at Unknown time  . eletriptan (RELPAX) 40 MG tablet Take 1 tablet (40 mg total) by mouth as needed for  migraine. may repeat in 2 hours if necessary 10 tablet 11 More than a month at Unknown time  . ondansetron (ZOFRAN-ODT) 4 MG disintegrating tablet Take 4 mg by mouth every 8 (eight) hours as needed for nausea or vomiting.     Marland Kitchen oxyCODONE (ROXICODONE) 5 MG/5ML solution Take 5-10 mg by mouth every 4 (four) hours as needed for pain.  0   . pantoprazole (PROTONIX) 40 MG tablet Take 40 mg by mouth daily.     Marland Kitchen PROAIR HFA 108 (90 BASE) MCG/ACT inhaler Inhale 2 puffs into the lungs every 4 (four) hours as needed.  0 More than a month at Unknown time      Bernadene Person, PharmD, BCPS [phone] 12/22/2017, 2:36 PM

## 2017-12-22 NOTE — Progress Notes (Signed)
Discussed post op day goals with patient including ambulation, IS, diet progression, pain, and nausea control.  Questions answered. 

## 2017-12-22 NOTE — Anesthesia Procedure Notes (Signed)
Procedure Name: Intubation Date/Time: 12/22/2017 7:17 AM Performed by: Audry Pili, MD Pre-anesthesia Checklist: Emergency Drugs available, Suction available, Patient identified, Patient being monitored and Timeout performed Patient Re-evaluated:Patient Re-evaluated prior to induction Oxygen Delivery Method: Circle system utilized Preoxygenation: Pre-oxygenation with 100% oxygen Induction Type: IV induction Laryngoscope Size: Mac and 3 Grade View: Grade III Tube type: Oral Tube size: 7.5 mm Number of attempts: 1 Airway Equipment and Method: Stylet Placement Confirmation: ETT inserted through vocal cords under direct vision,  positive ETCO2 and breath sounds checked- equal and bilateral Secured at: 22 cm Tube secured with: Tape Dental Injury: Teeth and Oropharynx as per pre-operative assessment  Difficulty Due To: Difficulty was unanticipated

## 2017-12-22 NOTE — Transfer of Care (Signed)
Immediate Anesthesia Transfer of Care Note  Patient: Barbara [name]  Procedure(s) Performed: LAPAROSCOPIC GASTRIC SLEEVE RESECTION, UPPER ENDO, ERAS PATHWAY (N/A )  Patient Location: PACU  Anesthesia Type:General  Level of Consciousness: awake, oriented, drowsy and patient cooperative  Airway & Oxygen Therapy: Patient Spontanous Breathing and Patient connected to face mask oxygen  Post-op Assessment: Report given to RN, Post -op Vital signs reviewed and stable and Patient moving all extremities  Post vital signs: Reviewed and stable  Last Vitals:  Vitals Value Taken Time  BP    Temp    Pulse    Resp    SpO2      Last Pain:  Vitals:   12/22/17 0606  TempSrc: Oral  PainSc: 0-No pain         Complications: No apparent anesthesia complications

## 2017-12-22 NOTE — Interval H&P Note (Signed)
History and Physical Interval Note:  12/22/2017 6:57 AM  Barbara [name]  has presented today for surgery, with the diagnosis of Morbid Obesity, Hypothyroidism  The various methods of treatment have been discussed with the patient and family. After consideration of risks, benefits and other options for treatment, the patient has consented to  Procedure(s): LAPAROSCOPIC GASTRIC SLEEVE RESECTION, UPPER ENDO, ERAS PATHWAY (N/A) as a surgical intervention .  The patient's history has been reviewed, patient examined, no change in status, stable for surgery.  I have reviewed the patient's chart and labs.  Questions were answered to the patient's satisfaction.     Miarose Lippert Lollie Sails

## 2017-12-22 NOTE — Op Note (Signed)
Preoperative diagnosis: laparoscopic sleeve gastrectomy  Postoperative diagnosis: Same   Procedure: Upper endoscopy   Surgeon: Kden Wagster, M.D.  Anesthesia: Gen.   Indications for procedure: This patient was undergoing a laparoscopic sleeve gastrectomy.   Description of procedure: The endoscopy was placed in the mouth and into the oropharynx and under endoscopic vision it was advanced to the esophagogastric junction. The pouch was insufflated and no bleeding or bubbles were seen. The GEJ was identified at 38 cm from the teeth. No bleeding or leaks were detected. The scope was withdrawn without difficulty.   Abdur Hoglund, M.D. General, Bariatric, & Minimally Invasive Surgery [facility]    

## 2017-12-22 NOTE — Anesthesia Postprocedure Evaluation (Signed)
Anesthesia Post Note  Patient: Barbara [name]  Procedure(s) Performed: LAPAROSCOPIC GASTRIC SLEEVE RESECTION, UPPER ENDO, ERAS PATHWAY (N/A )     Patient location during evaluation: PACU Anesthesia Type: General Level of consciousness: awake and alert Pain management: pain level controlled Vital Signs Assessment: post-procedure vital signs reviewed and stable Respiratory status: spontaneous breathing, nonlabored ventilation, respiratory function stable and patient connected to nasal cannula oxygen Cardiovascular status: blood pressure returned to baseline and stable Postop Assessment: no apparent nausea or vomiting Anesthetic complications: no    Last Vitals:  Vitals:   12/22/17 0945 12/22/17 1000  BP: 114/67 124/79  Pulse: 64 69  Resp: 14 13  Temp:    SpO2: 100% 100%    Last Pain:  Vitals:   12/22/17 1000  TempSrc:   PainSc: 4                  Beryle Lathe

## 2017-12-23 ENCOUNTER — Encounter (HOSPITAL_COMMUNITY): Payer: Self-pay | Admitting: Surgery

## 2017-12-23 LAB — CBC WITH DIFFERENTIAL/PLATELET
ABS IMMATURE GRANULOCYTES: 0.05 10*3/uL (ref 0.00–0.07)
BASOS ABS: 0 10*3/uL (ref 0.0–0.1)
Basophils Relative: 0 %
Eosinophils Absolute: 0 10*3/uL (ref 0.0–0.5)
Eosinophils Relative: 0 %
HEMATOCRIT: 38.2 % (ref 36.0–46.0)
Hemoglobin: 12.9 g/dL (ref 12.0–15.0)
IMMATURE GRANULOCYTES: 0 %
LYMPHS ABS: 2.6 10*3/uL (ref 0.7–4.0)
Lymphocytes Relative: 19 %
MCH: 30.8 pg (ref 26.0–34.0)
MCHC: 33.8 g/dL (ref 30.0–36.0)
MCV: 91.2 fL (ref 80.0–100.0)
MONOS PCT: 8 %
Monocytes Absolute: 1.1 10*3/uL — ABNORMAL HIGH (ref 0.1–1.0)
NEUTROS PCT: 73 %
NRBC: 0 % (ref 0.0–0.2)
Neutro Abs: 9.6 10*3/uL — ABNORMAL HIGH (ref 1.7–7.7)
Platelets: 340 10*3/uL (ref 150–400)
RBC: 4.19 MIL/uL (ref 3.87–5.11)
RDW: 13.1 % (ref 11.5–15.5)
WBC: 13.4 10*3/uL — ABNORMAL HIGH (ref 4.0–10.5)

## 2017-12-23 LAB — HEMOGLOBIN AND HEMATOCRIT, BLOOD
HCT: 40.4 % (ref 36.0–46.0)
Hemoglobin: 13.4 g/dL (ref 12.0–15.0)

## 2017-12-23 LAB — COMPREHENSIVE METABOLIC PANEL
ALBUMIN: 3.5 g/dL (ref 3.5–5.0)
ALK PHOS: 33 U/L — AB (ref 38–126)
ALT: 20 U/L (ref 0–44)
AST: 23 U/L (ref 15–41)
Anion gap: 8 (ref 5–15)
BILIRUBIN TOTAL: 0.8 mg/dL (ref 0.3–1.2)
BUN: 13 mg/dL (ref 6–20)
CALCIUM: 8.3 mg/dL — AB (ref 8.9–10.3)
CO2: 22 mmol/L (ref 22–32)
CREATININE: 0.78 mg/dL (ref 0.44–1.00)
Chloride: 109 mmol/L (ref 98–111)
GFR calc Af Amer: >60 mL/min (ref >60)
GFR calc non Af Amer: >60 mL/min (ref >60)
GLUCOSE: 101 mg/dL — AB (ref 70–99)
POTASSIUM: 3.7 mmol/L (ref 3.5–5.1)
Sodium: 139 mmol/L (ref 135–145)
TOTAL PROTEIN: 6.1 g/dL — AB (ref 6.5–8.1)

## 2017-12-23 MED ORDER — DOCUSATE SODIUM 100 MG PO CAPS: 100.0000 mg | ORAL_CAPSULE | Freq: Two times a day (BID) | ORAL | 0 refills | Status: DC

## 2017-12-23 NOTE — Progress Notes (Addendum)
S: Slept well, no acute issues. Has already completed water and 1.5 protein shakes, walked numerous times last night. Had some bilateral anterior shoulder pain from gas but this has resolved. Has had a bowel movement. No dysphagia or reflux, mild nausea when full. Has not needed any narcotics or PRNs.   Vitals, labs, intake/output, and orders reviewed at this time. Afebrile, HR 63-87, normotensive, sats 96% room air. PO 1080, UOP 1700. CMP unremarkable, WBC 13.4 (12.6 preop), hgb 12.9 (15.8 preop), plt 340 (440 preop)  Gen: A&Ox3, no distress  H&N: EOMI, atraumatic, neck supple Chest: unlabored respirations, RRR Abd: soft, appropriately mildly tender, nondistended, incisions c/d/i with steris Ext: warm, no edema Neuro: grossly normal  Lines/tubes/drains: PIV  A/P:  POD 1 sleeve gastrectomy, doing well -Continue clears/ protein shakes -Continue pulm toilet, ambulation, lovenox for DVT ppx -HGB decrease is likely dilutional as she has no clinical evidence of ongoing bleeding. Will recheck later this AM prior to discharge  Anticipate discharge home today.   Phylliss Blakes, MD [facility] [place] Pager [phone]

## 2017-12-23 NOTE — Progress Notes (Signed)
Patient alert and oriented, pain is controlled. Patient is tolerating fluids, advanced to protein shake today, patient is tolerating well.  Reviewed Gastric sleeve discharge instructions with patient and patient is able to articulate understanding.  Provided information on BELT program, Support Group and [facility] outpatient pharmacy. All questions answered, will continue to monitor.  Hbg/Hct rechecked at 1045 and WNL.  Will notify Dr. Fredricka Bonine,

## 2017-12-23 NOTE — Progress Notes (Signed)
Pt was discharged home today. Instructions were reviewed with patient, and questions were answered. Pt was taken to main entrance via wheelchair by NT.  

## 2017-12-23 NOTE — Discharge Summary (Signed)
Physician Discharge Summary  Barbara [name] LGX:[id number] DOB: 1975/08/04 DOA: 12/22/2017  PCP: Gregor Hams, MD  Admit date: 12/22/2017 Discharge date:  12/23/2017   Recommendations for Outpatient Follow-up:   Follow-up Information    Clovis Riley, MD Follow up on 01/06/2018.   Specialty:  General Surgery Why:  @ 2:30pm w/ Dr. Denyce Robert information: [address] [place] [address] [phone]        Clovis Riley, MD .   Specialty:  General Surgery Contact information: [address] [place] [address] [phone]          Discharge Diagnoses:  Active Problems:   * No active hospital problems. *   Surgical Procedure: Laparoscopic Sleeve Gastrectomy, upper endoscopy  Discharge Condition: Good Disposition: Home  Diet recommendation: Postoperative sleeve gastrectomy diet (liquids only)  Filed Weights   12/22/17 0606  Weight: 117.4 kg     Hospital Course:  The patient was admitted for a planned laparoscopic sleeve gastrectomy. Please see operative note. Preoperatively the patient was given lovenox for DVT prophylaxis. Postoperative prophylactic Lovenox dosing was started on the evening of postoperative day 0. ERAS protocol was used. On the evening of postoperative day 0, the patient was started on water and ice chips. On postoperative day 1 the patient had no fever or tachycardia and was tolerating water in their diet was gradually advanced throughout the day. The patient was ambulating without difficulty. Their vital signs are stable without fever or tachycardia. Her hemoglobin did drop on post-op day one but was likely dilutional; this was rechecked mid-day and was stable/ slightly increased. The patient had received discharge instructions and counseling. They were deemed stable for discharge and had met discharge criteria   Discharge Instructions  Discharge Instructions    Ambulate hourly while  awake   Complete by:  As directed    Call MD for:  difficulty breathing, headache or visual disturbances   Complete by:  As directed    Call MD for:  persistant dizziness or light-headedness   Complete by:  As directed    Call MD for:  persistant nausea and vomiting   Complete by:  As directed    Call MD for:  redness, tenderness, or signs of infection (pain, swelling, redness, odor or green/yellow discharge around incision site)   Complete by:  As directed    Call MD for:  severe uncontrolled pain   Complete by:  As directed    Call MD for:  temperature >101 F   Complete by:  As directed    Incentive spirometry   Complete by:  As directed    Perform hourly while awake     Allergies as of 12/23/2017      Reactions   Sulfamethoxazole-trimethoprim Nausea And Vomiting, Rash   Penicillins Hives   Tolerates cephalosporins. Has patient had a PCN reaction causing immediate rash, facial/tongue/throat swelling, SOB or lightheadedness with hypotension: No Has patient had a PCN reaction causing severe rash involving mucus membranes or skin necrosis: No Has patient had a PCN reaction that required hospitalization: No Has patient had a PCN reaction occurring within the last 10 years: No If all of the above answers are "NO", then may proceed with Cephalosporin use.   Prednisone Hives   Latex Rash   Some gloves and tape cause red and raw skin. Skin peels off.       Medication List    TAKE these medications   ALLEGRA-D  ALLERGY & CONGESTION 180-240 MG 24 hr tablet Generic drug:  fexofenadine-pseudoephedrine Take 1 tablet by mouth daily as needed for allergies.   ALPRAZolam 0.25 MG tablet Commonly known as:  XANAX Take 1 tablet (0.25 mg total) by mouth 2 (two) times daily as needed for anxiety.   BARIATRIC MULTIVITAMINS/IRON PO Take 1 tablet by mouth 2 (two) times daily.   calcium-vitamin D 500-200 MG-UNIT tablet Commonly known as:  OSCAL WITH D Take 1 tablet by mouth 3 (three) times  daily.   cetirizine 10 MG tablet Commonly known as:  ZYRTEC Take 10 mg by mouth daily.   docusate sodium 100 MG capsule Commonly known as:  COLACE Take 1 capsule (100 mg total) by mouth 2 (two) times daily. OK to decrease or stop if having loose bowel movements   eletriptan 40 MG tablet Commonly known as:  RELPAX Take 1 tablet (40 mg total) by mouth as needed for migraine. may repeat in 2 hours if necessary   HM VITAMIN D3 100 MCG (4000 UT) Caps Generic drug:  Cholecalciferol Take 4,000 Units by mouth daily.   ondansetron 4 MG disintegrating tablet Commonly known as:  ZOFRAN-ODT Take 4 mg by mouth every 8 (eight) hours as needed for nausea or vomiting.   oxyCODONE 5 MG/5ML solution Commonly known as:  ROXICODONE Take 5-10 mg by mouth every 4 (four) hours as needed for pain.   pantoprazole 40 MG tablet Commonly known as:  PROTONIX Take 40 mg by mouth daily.   PROAIR HFA 108 (90 Base) MCG/ACT inhaler Generic drug:  albuterol Inhale 2 puffs into the lungs every 4 (four) hours as needed.      Follow-up Information    Clovis Riley, MD Follow up on 01/06/2018.   Specialty:  General Surgery Why:  @ 2:30pm w/ Dr. Denyce Robert information: [place] [address] [phone]        Clovis Riley, MD .   Specialty:  General Surgery Contact information: [address] [place] [address] [phone]            The results of significant diagnostics from this hospitalization (including imaging, microbiology, ancillary and laboratory) are listed below for reference.    Significant Diagnostic Studies: No results found.  Labs: Basic Metabolic Panel: Recent Labs  Lab 12/21/17 1615 12/23/17 0447  NA 137 139  K 4.1 3.7  CL 104 109  CO2 24 22  GLUCOSE 91 101*  BUN 19 13  CREATININE 0.90 0.78  CALCIUM 9.5 8.3*   Liver Function Tests: Recent Labs  Lab 12/21/17 1615 12/23/17 0447  AST 26  23  ALT 21 20  ALKPHOS 40 33*  BILITOT 0.8 0.8  PROT 7.3 6.1*  ALBUMIN 4.1 3.5    CBC: Recent Labs  Lab 12/21/17 1615 12/23/17 0447 12/23/17 1053  WBC 12.6* 13.4*  --   NEUTROABS 7.9* 9.6*  --   HGB 15.8* 12.9 13.4  HCT 47.1* 38.2 40.4  MCV 89.4 91.2  --   PLT 440* 340  --     CBG: No results for input(s): GLUCAP in the last 168 hours.  Active Problems:   * No active hospital problems. *   Signed:  Romana Juniper, MD [facility], [place] 12/23/2017, 3:25 PM

## 2017-12-23 NOTE — Discharge Instructions (Signed)
° ° ° °GASTRIC BYPASS/SLEEVE ° Home Care Instructions ° ° These instructions are to help you care for yourself when you go home. ° °Call: If you have any problems. °• Call [phone] and ask for the surgeon on call °• If you need immediate help, come to the ER at [place].  °• Tell the ER staff that you are a new post-op gastric bypass or gastric sleeve patient °  °Signs and symptoms to report: • Severe vomiting or nausea °o If you cannot keep down clear liquids for longer than 1 day, call your surgeon  °• Abdominal pain that does not get better after taking your pain medication °• Fever over 100.4° F with chills °• Heart beating over 100 beats a minute °• Shortness of breath at rest °• Chest pain °•  Redness, swelling, drainage, or foul odor at incision (surgical) sites °•  If your incisions open or pull apart °• Swelling or pain in calf (lower leg) °• Diarrhea (Loose bowel movements that happen often), frequent watery, uncontrolled bowel movements °• Constipation, (no bowel movements for 3 days) if this happens: Pick one °o Milk of Magnesia, 2 tablespoons by mouth, 3 times a day for 2 days if needed °o Stop taking Milk of Magnesia once you have a bowel movement °o Call your doctor if constipation continues °Or °o Miralax  (instead of Milk of Magnesia) following the label instructions °o Stop taking Miralax once you have a bowel movement °o Call your doctor if constipation continues °• Anything you think is not normal °  °Normal side effects after surgery: • Unable to sleep at night or unable to focus °• Irritability or moody °• Being tearful (crying) or depressed °These are common complaints, possibly related to your anesthesia medications that put you to sleep, stress of surgery, and change in lifestyle.  This usually goes away a few weeks after surgery.  If these feelings continue, call your primary care doctor. °  °Wound Care: You may have surgical glue, steri-strips, or staples over your incisions after  surgery °• Surgical glue:  Looks like a clear film over your incisions and will wear off a little at a time °• Steri-strips: Strips of tape over your incisions. You may notice a yellowish color on the skin under the steri-strips. This is used to make the   steri-strips stick better. Do not pull the steri-strips off - let them fall off °• Staples: Staples may be removed before you leave the hospital °o If you go home with staples, call [facility], [phone] at for an appointment with your surgeon’s nurse to have staples removed 10 days after surgery. °• Showering: You may shower two (2) days after your surgery unless your surgeon tells you differently °o Wash gently around incisions with warm soapy water, rinse well, and gently pat dry  °o No tub baths until staples are removed, steri-strips fall off or glue is gone.  °  °Medications: • Medications should be liquid or crushed if larger than the size of a dime °• Extended release pills (medication that release a little bit at a time through the day) should NOT be crushed or cut. (examples include XL, ER, DR, SR) °• Depending on the size and number of medications you take, you may need to space (take a few throughout the day)/change the time you take your medications so that you do not over-fill your pouch (smaller stomach) °• Make sure you follow-up with your primary care doctor to   make medication changes needed during rapid weight loss and life-style changes °• If you have diabetes, follow up with the doctor that orders your diabetes medication(s) within one week after surgery and check your blood sugar regularly. °• Do not drive while taking prescription pain medication  °• It is ok to take Tylenol by the bottle instructions with your pain medicine or instead of your pain medicine as needed.  DO NOT TAKE NSAIDS (EXAMPLES OF NSAIDS:  IBUPROFREN/ NAPROXEN)  °Diet:                    First 2 Weeks ° You will see the dietician t about two (2) weeks  after your surgery. The dietician will increase the types of foods you can eat if you are handling liquids well: °• If you have severe vomiting or nausea and cannot keep down clear liquids lasting longer than 1 day, call your surgeon @ ([phone]) °Protein Shake °• Drink at least 2 ounces of shake 5-6 times per day °• Each serving of protein shakes (usually 8 - 12 ounces) should have: °o 15 grams of protein  °o And no more than 5 grams of carbohydrate  °• Goal for protein each day: °o Men = 80 grams per day °o Women = 60 grams per day °• Protein powder may be added to fluids such as non-fat milk or Lactaid milk or unsweetened Soy/Almond milk (limit to 35 grams added protein powder per serving) ° °Hydration °• Slowly increase the amount of water and other clear liquids as tolerated (See Acceptable Fluids) °• Slowly increase the amount of protein shake as tolerated  °•  Sip fluids slowly and throughout the day.  Do not use straws. °• May use sugar substitutes in small amounts (no more than 6 - 8 packets per day; i.e. Splenda) ° °Fluid Goal °• The first goal is to drink at least 8 ounces of protein shake/drink per day (or as directed by the nutritionist); some examples of protein shakes are Syntrax Nectar, Adkins Advantage, EAS Edge HP, and Unjury. See handout from pre-op Bariatric Education Class: °o Slowly increase the amount of protein shake you drink as tolerated °o You may find it easier to slowly sip shakes throughout the day °o It is important to get your proteins in first °• Your fluid goal is to drink 64 - 100 ounces of fluid daily °o It may take a few weeks to build up to this °• 32 oz (or more) should be clear liquids  °And  °• 32 oz (or more) should be full liquids (see below for examples) °• Liquids should not contain sugar, caffeine, or carbonation ° °Clear Liquids: °• Water or Sugar-free flavored water (i.e. Fruit H2O, Propel) °• Decaffeinated coffee or tea (sugar-free) °• Crystal Lite, Wyler’s Lite,  Minute Maid Lite °• Sugar-free Jell-O °• Bouillon or broth °• Sugar-free Popsicle:   *Less than 20 calories each; Limit 1 per day ° °Full Liquids: °Protein Shakes/Drinks + 2 choices per day of other full liquids °• Full liquids must be: °o No More Than 15 grams of Carbs per serving  °o No More Than 3 grams of Fat per serving °• Strained low-fat cream soup (except Cream of Potato or Tomato) °• Non-Fat milk °• Fat-free Lactaid Milk °• Unsweetened Soy Or Unsweetened Almond Milk °• Low Sugar yogurt (Dannon Lite & Fit, Greek yogurt; Oikos Triple Zero; Chobani Simply 100; Yoplait 100 calorie Greek - No Fruit on the Bottom) ° °  °Vitamins   and Minerals • Start 1 day after surgery unless otherwise directed by your surgeon °• 2 Chewable Bariatric Specific Multivitamin / Multimineral Supplement with iron (Example: Bariatric Advantage Multi EA) °• Chewable Calcium with Vitamin D-3 °(Example: 3 Chewable Calcium Plus 600 with Vitamin D-3) °o Take 500 mg three (3) times a day for a total of 1500 mg each day °o Do not take all 3 doses of calcium at one time as it may cause constipation, and you can only absorb 500 mg  at a time  °o Do not mix multivitamins containing iron with calcium supplements; take 2 hours apart °• Menstruating women and those with a history of anemia (a blood disease that causes weakness) may need extra iron °o Talk with your doctor to see if you need more iron °• Do not stop taking or change any vitamins or minerals until you talk to your dietitian or surgeon °• Your Dietitian and/or surgeon must approve all vitamin and mineral supplements °  °Activity and Exercise: Limit your physical activity as instructed by your doctor.  It is important to continue walking at home.  During this time, use these guidelines: °• Do not lift anything greater than ten (10) pounds for at least two (2) weeks °• Do not go back to work or drive until your surgeon says you can °• You may have sex when you feel comfortable  °o It is  VERY important for female patients to use a reliable birth control method; fertility often increases after surgery  °o All hormonal birth control will be ineffective for 30 days after surgery due to medications given during surgery a barrier method must be used. °o Do not get pregnant for at least 18 months °• Start exercising as soon as your doctor tells you that you can °o Make sure your doctor approves any physical activity °• Start with a simple walking program °• Walk 5-15 minutes each day, 7 days per week.  °• Slowly increase until you are walking 30-45 minutes per day °Consider joining our BELT program. [phone] or email [email or web address] °  °Special Instructions Things to remember: °• Use your CPAP when sleeping if this applies to you ° °• [facility] has two free Bariatric Surgery Support Groups that meet monthly °o The 3rd Thursday of each month, 6 pm, [facility]  °o The 2nd Friday of each month, 11:45 am in the private dining room in the basement of [facility] °• It is very important to keep all follow up appointments with your surgeon, dietitian, primary care physician, and behavioral health practitioner °• Routine follow up schedule with your surgeon include appointments at 2-3 weeks, 6-8 weeks, 6 months, and 1 year at a minimum.  Your surgeon may request to see you more often.   °o After the first year, please follow up with your bariatric surgeon and dietitian at least once a year in order to maintain best weight loss results °[facility]: [phone] °[facility]: [phone] °Bariatric Nurse Coordinator: [phone] °  °   Reviewed and Endorsed  °by [facility] Patient Education Committee, June, 2016 °Edits Approved: Aug, 2018 ° ° ° °

## 2017-12-23 NOTE — Plan of Care (Signed)

## 2017-12-28 ENCOUNTER — Telehealth (HOSPITAL_COMMUNITY): Payer: Self-pay

## 2017-12-28 NOTE — Telephone Encounter (Addendum)
Patient returned call to discuss post bariatric surgery follow up questions.  See below:   1.  Tell me about your pain and pain management?patient reports no pain since surgery, has not needed any prescribed pain medications  2.  Let's talk about fluid intake.  How much total fluid are you taking in?76 ounces of fluid  3.  How much protein have you taken in the last 2 days?60-68 grams of protein  4.  Have you had nausea?  Tell me about when have experienced nausea and what you did to help?nausea once after drinking to much fluid, zofran helped relieve nausea  5.  Has the frequency or color changed with your urine?urine light in color   6.  Tell me what your incisions look like?no problems with incisions itching.  We discussed no lotions or ointments near incisions  7.  Have you been passing gas? BM?passing gas has had 2 bms since surgery,  First bm  Hard and uncomfortable.  Discussed stool softner which patient taking.  Also patient has MOM which we discussed using with no bm to prevent constipation in future  8.  If a problem or question were to arise who would you call?  Do you know contact numbers for [facility], [facility], and [facility]?  9.  How has the walking going?walking every hour and more  10.  How are your vitamins and calcium going?  How are you taking them?taking bariatric advantage x 2 per day and calcium x 3 per day as recommended

## 2018-01-05 ENCOUNTER — Encounter: Payer: 59 | Attending: Surgery | Admitting: Skilled Nursing Facility1

## 2018-01-05 DIAGNOSIS — Z8249 Family history of ischemic heart disease and other diseases of the circulatory system: Secondary | ICD-10-CM | POA: Insufficient documentation

## 2018-01-05 DIAGNOSIS — Z9889 Other specified postprocedural states: Secondary | ICD-10-CM | POA: Insufficient documentation

## 2018-01-05 DIAGNOSIS — E079 Disorder of thyroid, unspecified: Secondary | ICD-10-CM | POA: Insufficient documentation

## 2018-01-05 DIAGNOSIS — F419 Anxiety disorder, unspecified: Secondary | ICD-10-CM | POA: Diagnosis not present

## 2018-01-05 DIAGNOSIS — Z888 Allergy status to other drugs, medicaments and biological substances status: Secondary | ICD-10-CM | POA: Diagnosis not present

## 2018-01-05 DIAGNOSIS — Z79899 Other long term (current) drug therapy: Secondary | ICD-10-CM | POA: Diagnosis not present

## 2018-01-05 DIAGNOSIS — M549 Dorsalgia, unspecified: Secondary | ICD-10-CM | POA: Insufficient documentation

## 2018-01-05 DIAGNOSIS — Z880 Allergy status to penicillin: Secondary | ICD-10-CM | POA: Diagnosis not present

## 2018-01-05 DIAGNOSIS — Z713 Dietary counseling and surveillance: Secondary | ICD-10-CM | POA: Diagnosis present

## 2018-01-05 DIAGNOSIS — Z833 Family history of diabetes mellitus: Secondary | ICD-10-CM | POA: Insufficient documentation

## 2018-01-05 DIAGNOSIS — Z791 Long term (current) use of non-steroidal anti-inflammatories (NSAID): Secondary | ICD-10-CM | POA: Diagnosis not present

## 2018-01-05 DIAGNOSIS — Z8261 Family history of arthritis: Secondary | ICD-10-CM | POA: Diagnosis not present

## 2018-01-05 DIAGNOSIS — E669 Obesity, unspecified: Secondary | ICD-10-CM

## 2018-01-06 ENCOUNTER — Encounter: Payer: Self-pay | Admitting: Skilled Nursing Facility1

## 2018-01-06 NOTE — Progress Notes (Signed)
Bariatric Class:  Appt start time: 1530 end time:  1630.  2 Week Post-Operative Nutrition Class  Patient was seen on 01/06/2018 for [facility]   Surgery date: 12/22/2017 Surgery type: Sleeve Start weight at [facility]: 277 Weight today: 250   TANITA  BODY COMP RESULTS  01/06/2018   BMI (kg/m^2) 40.4   Fat Mass (lbs) 132.8   Fat Free Mass (lbs) 117.2   Total Body Water (lbs) 86.4   The following the learning objectives were met by the patient during this course:  Identifies Phase 3A (Soft, High Proteins) Dietary Goals and will begin from 2 weeks post-operatively to 2 months post-operatively  Identifies appropriate sources of fluids and proteins   States protein recommendations and appropriate sources post-operatively  Identifies the need for appropriate texture modifications, mastication, and bite sizes when consuming solids  Identifies appropriate multivitamin and calcium sources post-operatively  Describes the need for physical activity post-operatively and will follow MD recommendations  States when to call healthcare provider regarding medication questions or post-operative complications  Handouts given during class include:  Phase 3A: Soft, High Protein Diet Handout  Follow-Up Plan: Patient will follow-up at [facility] in 6 weeks for 2 month post-op nutrition visit for diet advancement per MD.

## 2018-01-07 NOTE — Progress Notes (Signed)
REVIEWED-NO ADDITIONAL RECOMMENDATIONS. 

## 2018-01-11 ENCOUNTER — Telehealth: Payer: Self-pay | Admitting: Skilled Nursing Facility1

## 2018-01-11 NOTE — Telephone Encounter (Signed)
RD called pt to verify fluid intake once starting soft, solid proteins 2 week post-bariatric surgery.   Daily Fluid intake: 64+ Daily Protein intake: 4 shrimp, egg- no issues with protein  Concerns/issues: pt states she had a list of questions.   What can I eat for Thanksgiving: lamb, ham, beans, Malaw[place]urkey, etc. Can I eat a pickle: no Can I eat cheese and egg at the same time: yes Can I have a [place]urkey burger just the patty: yes  Can I have pepperoni: yes but only 1-2 slices due to the grease  Pt states she was told not to make protein powders in the blender because it would introduce air into the food and stretch out her stomach: Dietitian counseled this on not being accurate and she can make her protein shakes without fear of added air and even if there was air it would not cause her stomach to stretch out.

## 2018-01-20 ENCOUNTER — Telehealth: Payer: Self-pay | Admitting: Skilled Nursing Facility1

## 2018-01-20 NOTE — Telephone Encounter (Signed)
Dietitian returned pts phone call.  Pt state sshe is over the chewable multivitamin.  Dietitian advised the pt try the capsule version later in the day and with food in her stomach.

## 2018-02-15 ENCOUNTER — Ambulatory Visit: Payer: Self-pay | Admitting: Dietician

## 2018-02-18 ENCOUNTER — Encounter: Payer: Self-pay | Admitting: Dietician

## 2018-02-18 ENCOUNTER — Encounter: Payer: BLUE CROSS/BLUE SHIELD | Attending: Surgery | Admitting: Dietician

## 2018-02-18 VITALS — Wt 235.0 lb

## 2018-02-18 DIAGNOSIS — E079 Disorder of thyroid, unspecified: Secondary | ICD-10-CM | POA: Diagnosis not present

## 2018-02-18 DIAGNOSIS — Z713 Dietary counseling and surveillance: Secondary | ICD-10-CM | POA: Insufficient documentation

## 2018-02-18 DIAGNOSIS — Z833 Family history of diabetes mellitus: Secondary | ICD-10-CM | POA: Insufficient documentation

## 2018-02-18 DIAGNOSIS — F419 Anxiety disorder, unspecified: Secondary | ICD-10-CM | POA: Diagnosis not present

## 2018-02-18 DIAGNOSIS — Z79899 Other long term (current) drug therapy: Secondary | ICD-10-CM | POA: Diagnosis not present

## 2018-02-18 DIAGNOSIS — Z791 Long term (current) use of non-steroidal anti-inflammatories (NSAID): Secondary | ICD-10-CM | POA: Diagnosis not present

## 2018-02-18 DIAGNOSIS — M549 Dorsalgia, unspecified: Secondary | ICD-10-CM | POA: Insufficient documentation

## 2018-02-18 DIAGNOSIS — Z8249 Family history of ischemic heart disease and other diseases of the circulatory system: Secondary | ICD-10-CM | POA: Diagnosis not present

## 2018-02-18 DIAGNOSIS — Z8261 Family history of arthritis: Secondary | ICD-10-CM | POA: Diagnosis not present

## 2018-02-18 DIAGNOSIS — E669 Obesity, unspecified: Secondary | ICD-10-CM

## 2018-02-18 DIAGNOSIS — Z9889 Other specified postprocedural states: Secondary | ICD-10-CM | POA: Insufficient documentation

## 2018-02-18 DIAGNOSIS — Z880 Allergy status to penicillin: Secondary | ICD-10-CM | POA: Insufficient documentation

## 2018-02-18 DIAGNOSIS — Z888 Allergy status to other drugs, medicaments and biological substances status: Secondary | ICD-10-CM | POA: Diagnosis not present

## 2018-02-18 NOTE — Progress Notes (Signed)
Bariatric Follow-Up Visit 8 Weeks Post-Operative Sleeve Surgery Medical Nutrition Therapy  Appt Start Time: 11:45am End Time: 12:30pm  Barbara [name] was seen on 02/18/2018 for Post-Operative Bariatric Surgery Nutrition Management.  Primary Concerns Today: Next phase of post-op diet, which foods are/are not allowed, new bariatric multivitamin. Barbara [name] arrives with her husband Both had several questions during today's appointment, and husband seems very supportive.    NUTRITION ASSESSMENT  Anthropometrics  Start weight at [facility]: 277 lbs (Date: 04/30/2017) Today's weight: 235 lbs Weight change: -42 lbs total (-15 lbs since last visit 6 weeks ago on 01/06/2018)  TANITA  Body Composition Results  01/06/2018 02/18/2018   BMI (kg/m2) 40.4 37.4   Fat Mass (lbs) 132.8 118.2   Fat Free Mass (lbs) 117.2 116.8   Total Body Water (lbs) 86.4 85.4   Total Body Water (%)  36.3   Clinical Medications: see list Supplements: bariatric MVI + calcium   24-Hr Dietary Recall First Meal: protein cookie bites (Kay's) Snack: asiago + pepper jack cheese   Second Meal: sugar-free hazelnut creamer + stevia + protein powder (20g) Snack: none  Third Meal: real good poppers (22g)  Snack: none Beverages: water w/ Crystal Light + G2 + Powerade Zero + Gold Peak diet tea  Food & Nutrition Related Hx Dietary Hx: Barbara [name] states she tolerates scrambled eggs with cheese, chicken, shrimp, steak, pepperoni, and protein bars/shakes. Barbara [name] states she and her husband have researched bariatric sleeve air fryer recipes to try. Barbara [name] asked many questions about foods that are/are not allowed, such as caprese salad, air fried meats, bacon, chicken wings, etc.  Estimated Daily Fluid Intake: 80 oz Estimated Daily Protein Intake: 75 g  Physical Activity  Current average weekly physical activity: walking 2 miles/day, [employer] a few times since surgery    Post-Op Goals Using straws: No Drinking while eating: No Chewing/swallowing difficulties: No  Changes in  vision: No Changes to mood/headaches: No Hair loss/changes to skin/nails: No Difficulty focusing/concentrating: No Sweating: No Dizziness/lightheadedness: Yes (twice within the past few days, thinks b/c stood up too fast)  Palpitations: No   Carbonated/caffeinated beverages: Coffee a few times N/V/D/C/Gas: No  Abdominal pain: No  Dumping syndrome: Yes (1 episode after eating spinach artichoke dip)   *Progress Towards Goals: Barbara [name] has lost 15 lbs since her most recent nutrition visit 6 weeks ago. Barbara [name] states she would rather lose weight slowly at a healthy rate, but is somewhat discouraged that she has not lost more by this point. Barbara [name] is tolerating foods well and is achieving protein and fluid goals.    NUTRITION DIAGNOSIS  Overweight/obesity (Cactus-3.3) related to past poor dietary habits and physical inactivity as evidenced by patient w/ completed sleeve gastrectomy surgery following dietary guidelines for continued weight loss.   NUTRITION INTERVENTION Nutrition counseling (C-1) and education (E-2) to facilitate bariatric surgery goals, including: . Diet advancement to the next phase (phase 4) now including non-starchy vegetables . The importance of consuming adequate calories as well as certain nutrients daily due to the body's need for essential vitamins, minerals, and fats . The importance of daily physical activity and to reach a goal of at least 150 minutes of moderate to vigorous physical activity weekly (or as directed by their physician) due to benefits such as increased musculature and improved lab values  Handouts Provided Include   Phase IV: Protein + Non-Starchy Vegetables  Learning Style & Readiness for Change Teaching method utilized: Visual & Auditory  Demonstrated degree of understanding via: Teach Back  Barriers to  learning/adherence to lifestyle change: None Identified   MONITORING & EVALUATION Dietary intake, weekly physical activity, body weight, and  multivitamin.  Next Steps Patient is to follow-up in 4 months for 6 month post-op visit. Patient was advised to call [facility] with questions/concerns or to schedule an additional appointment in the meantime if needed.

## 2018-02-18 NOTE — Patient Instructions (Signed)
Try different types of bariatric multivitamins.   Next Diet Phase Instructions:  . Continue to aim for a minimum of 64 fluid ounces 7 days a week with at least 30 ounces being plain water . Eat non-starchy vegetables 2 times a day 7 days a week . Start out with soft cooked vegetables today and tomorrow; if tolerated, begin to eat raw vegetables including salads . Per meal/snack, eat 3 ounces of protein first then start on non-starchy vegetables; once you understand how much of your meal leads to satisfaction (and not full) while still eating 3 ounces of protein and non-starchy vegetables, you can eat them in any order (figure out how much you can eat at a time to get enough but not too much)  . Continue to aim for 30 minutes of physical activity at least 5 times a week

## 2018-05-17 ENCOUNTER — Encounter: Payer: Self-pay | Admitting: Family Medicine

## 2018-05-17 ENCOUNTER — Ambulatory Visit (INDEPENDENT_AMBULATORY_CARE_PROVIDER_SITE_OTHER): Payer: 59 | Admitting: Family Medicine

## 2018-05-17 ENCOUNTER — Other Ambulatory Visit: Payer: Self-pay

## 2018-05-17 ENCOUNTER — Encounter (INDEPENDENT_AMBULATORY_CARE_PROVIDER_SITE_OTHER): Payer: 59 | Admitting: Family Medicine

## 2018-05-17 VITALS — Temp 97.2°F | Ht 66.5 in | Wt 212.8 lb

## 2018-05-17 DIAGNOSIS — L239 Allergic contact dermatitis, unspecified cause: Secondary | ICD-10-CM

## 2018-05-17 DIAGNOSIS — R21 Rash and other nonspecific skin eruption: Secondary | ICD-10-CM

## 2018-05-17 MED ORDER — TRIAMCINOLONE ACETONIDE 0.1 % EX CREA: 1.0000 "application " | TOPICAL_CREAM | Freq: Two times a day (BID) | CUTANEOUS | 2 refills | Status: DC

## 2018-05-17 NOTE — Patient Instructions (Signed)
Thank you for coming in today. Take  Zyrtec daily and use benadryl as needed.  Use the triamcinolone cream on the rash.  Use gold bond itch for temporary control of itching.  Recheck with me as needed.  Call or go to the emergency room if you get worse, have trouble breathing, have chest pains, or palpitations.    Contact Dermatitis Dermatitis is redness, soreness, and swelling (inflammation) of the skin. Contact dermatitis is a reaction to certain substances that touch the skin. Many different substances can cause contact dermatitis. There are two types of contact dermatitis:  Irritant contact dermatitis. This type is caused by something that irritates your skin, such as having dry hands from washing them too often with soap. This type does not require previous exposure to the substance for a reaction to occur. This is the most common type.  Allergic contact dermatitis. This type is caused by a substance that you are allergic to, such as poison ivy. This type occurs when you have been exposed to the substance (allergen) and develop a sensitivity to it. Dermatitis may develop soon after your first exposure to the allergen, or it may not develop until the next time you are exposed and every time thereafter. What are the causes? Irritant contact dermatitis is most commonly caused by exposure to:  Makeup.  Soaps.  Detergents.  Bleaches.  Acids.  Metal salts, such as nickel. Allergic contact dermatitis is most commonly caused by exposure to:  Poisonous plants.  Chemicals.  Jewelry.  Latex.  Medicines.  Preservatives in products, such as clothing. What increases the risk? You are more likely to develop this condition if you have:  A job that exposes you to irritants or allergens.  Certain medical conditions, such as asthma or eczema. What are the signs or symptoms? Symptoms of this condition may occur on your body anywhere the irritant has touched you or is touched by you.   Symptoms include: ? Dryness or flaking. ? Redness. ? Cracks. ? Itching. ? Pain or a burning feeling. ? Blisters. ? Drainage of small amounts of blood or clear fluid from skin cracks. With allergic contact dermatitis, there may also be swelling in areas such as the eyelids, mouth, or genitals. How is this diagnosed? This condition is diagnosed with a medical history and physical exam.  A patch skin test may be performed to help determine the cause.  If the condition is related to your job, you may need to see an occupational medicine specialist. How is this treated? This condition is treated by checking for the cause of the reaction and protecting your skin from further contact. Treatment may also include:  Steroid creams or ointments. Oral steroid medicines may be needed in more severe cases.  Antibiotic medicines or antibacterial ointments, if a skin infection is present.  Antihistamine lotion or an antihistamine taken by mouth to ease itching.  A bandage (dressing). Follow these instructions at home: Skin care  Moisturize your skin as needed.  Apply cool compresses to the affected areas.  Try applying baking soda paste to your skin. Stir water into baking soda until it reaches a paste-like consistency.  Do not scratch your skin, and avoid friction to the affected area.  Avoid the use of soaps, perfumes, and dyes. Medicines  Take or apply over-the-counter and prescription medicines only as told by your health care provider.  If you were prescribed an antibiotic medicine, take or apply the antibiotic as told by your health care provider. Do  not stop using the antibiotic even if your condition improves. Bathing  Try taking a bath with: ? Epsom salts. Follow the instructions on the packaging. You can get these at your local pharmacy or grocery store. ? Baking soda. Pour a small amount into the bath as directed by your health care provider. ? Colloidal oatmeal. Follow  the instructions on the packaging. You can get this at your local pharmacy or grocery store.  Bathe less frequently, such as every other day.  Bathe in lukewarm water. Avoid using hot water. Bandage care  If you were given a bandage (dressing), change it as told by your health care provider.  Wash your hands with soap and water before and after you change your dressing. If soap and water are not available, use hand sanitizer. General instructions  Avoid the substance that caused your reaction. If you do not know what caused it, keep a journal to try to track what caused it. Write down: ? What you eat. ? What cosmetic products you use. ? What you drink. ? What you wear in the affected area. This includes jewelry.  Check the affected areas every day for signs of infection. Check for: ? More redness, swelling, or pain. ? More fluid or blood. ? Warmth. ? Pus or a bad smell.  Keep all follow-up visits as told by your health care provider. This is important. Contact a health care provider if:  Your condition does not improve with treatment.  Your condition gets worse.  You have signs of infection such as swelling, tenderness, redness, soreness, or warmth in the affected area.  You have a fever.  You have new symptoms. Get help right away if:  You have a severe headache, neck pain, or neck stiffness.  You vomit.  You feel very sleepy.  You notice red streaks coming from the affected area.  Your bone or joint underneath the affected area becomes painful after the skin has healed.  The affected area turns darker.  You have difficulty breathing. Summary  Dermatitis is redness, soreness, and swelling (inflammation) of the skin. Contact dermatitis is a reaction to certain substances that touch the skin.  Symptoms of this condition may occur on your body anywhere the irritant has touched you or is touched by you.  This condition is treated by figuring out what caused the  reaction and protecting your skin from further contact. Treatment may also include medicines and skin care.  Avoid the substance that caused your reaction. If you do not know what caused it, keep a journal to try to track what caused it.  Contact a health care provider if your condition gets worse or you have signs of infection such as swelling, tenderness, redness, soreness, or warmth in the affected area. This information is not intended to replace advice given to you by your health care provider. Make sure you discuss any questions you have with your health care provider. Document Released: 02/01/2000 Document Revised: 08/19/2017 Document Reviewed: 08/19/2017 Elsevier Interactive Patient Education  2019 [employer].

## 2018-05-17 NOTE — Progress Notes (Addendum)
Virtual Visit  via Video Note  I connected with      Barbara [name]  by a video enabled telemedicine application and verified that I am speaking with the correct person using two identifiers.   I discussed the limitations of evaluation and management by telemedicine and the availability of in person appointments. The patient expressed understanding and agreed to proceed.  History of Present Illness: Barbara [name] is a 43 y.o. female who would like to discuss rash.   Starting on Friday developed a rash in the right flexor elbow. It was itching. The rash then spread to feet. She notes that it is quite itchy.  She has tried oral benadryl and calamine lotion and hydrocortisone cream.   She denies any fever, chills, NVD. No new medications, no new soaps detergents or shampoos or cosmetics.   She notes that the rash is not improving and is spreading a bit. She feels well otherwise.    Observations/Objective: Temp (!) 97.2 F (36.2 C) (Oral)   Ht 5' 6.5" (1.689 m)   Wt 212 lb 12.8 oz (96.5 kg)   BMI 33.83 kg/m  Wt Readings from Last 5 Encounters:  05/17/18 212 lb 12.8 oz (96.5 kg)  02/18/18 235 lb (106.6 kg)  01/06/18 250 lb (113.4 kg)  12/22/17 258 lb 14.4 oz (117.4 kg)  12/21/17 264 lb 2 oz (119.8 kg)   Exam: Appearance normal Normal Speech.  No SOB, cough or hoarseness. No tachypnea.  Skin: Mild erythematous maculopapular rash on extremities.  Patient supplied pictures:             Assessment and Plan: 43 y.o. female with rash.  Very likely contact dermatitis or related to seasonal allergies.  Plan for daily Zyrtec along with triamcinolone cream.  Goldbond itch for temporary control of itching.  Recheck as needed.  PDMP not reviewed this encounter. No orders of the defined types were placed in this encounter.  Meds ordered this encounter  Medications  . triamcinolone cream (KENALOG) 0.1 %    Sig: Apply 1 application topically 2 (two) times daily.   Dispense:  453.6 g    Refill:  2    Follow Up Instructions:    I discussed the assessment and treatment plan with the patient. The patient was provided an opportunity to ask questions and all were answered. The patient agreed with the plan and demonstrated an understanding of the instructions.   The patient was advised to call back or seek an in-person evaluation if the symptoms worsen or if the condition fails to improve as anticipated.  I provided 25 minutes of non-face-to-face time during this encounter.    Historical information moved to improve visibility of documentation.  Past Medical History:  Diagnosis Date  . Anxiety   . Depression   . History of palpitations 2011   work-up with cardiologist with event monitor, showed no arrhythmias  . History of panic attacks   . Migraine headache   . Morbid obesity (HCC)   . PONV (postoperative nausea and vomiting)    Past Surgical History:  Procedure Laterality Date  . D & C HYSTEROSCOPY W/ ENDOMETRIAL ABLATION  01-29-2005   dr Despina Hidden  @[facility]   . ELBOW SURGERY Left   . ESOPHAGOGASTRODUODENOSCOPY N/A 05/18/2014   SLF: 1. Mild esophaigitis. 2. Mild non-erosive gastritis.   Marland Kitchen LAPAROSCOPIC GASTRIC SLEEVE RESECTION N/A 12/22/2017   Procedure: LAPAROSCOPIC GASTRIC SLEEVE RESECTION, UPPER ENDO, ERAS PATHWAY;  Surgeon: Berna Bue, MD;  Location:  [facility];  Service: General;  Laterality: N/A;  . ORIF RADIUS & ULNA FRACTURES Left 11-01-2006   dr Melvyn Novas @[facility]   . ULNAR NERVE TRANSPOSITION Left 2011   AND REMOVAL HARDWARE OF WRIST   Social History   Tobacco Use  . Smoking status: Never Smoker  . Smokeless tobacco: Never Used  Substance Use Topics  . Alcohol use: No   family history includes Diabetes in her father and paternal grandmother; Hyperlipidemia in her father and paternal grandmother; Hypertension in her father and paternal grandmother.  Medications: Current Outpatient Medications  Medication Sig Dispense Refill  . ALLEGRA-D  ALLERGY & CONGESTION 180-240 MG 24 hr tablet Take 1 tablet by mouth daily as needed for allergies.  3  . ALPRAZolam (XANAX) 0.25 MG tablet Take 1 tablet (0.25 mg total) by mouth 2 (two) times daily as needed for anxiety. 60 tablet 1  . calcium-vitamin D (OSCAL WITH D) 500-200 MG-UNIT tablet Take 1 tablet by mouth 3 (three) times daily.    Marland Kitchen docusate sodium (COLACE) 100 MG capsule Take 1 capsule (100 mg total) by mouth 2 (two) times daily. OK to decrease or stop if having loose bowel movements 10 capsule 0  . eletriptan (RELPAX) 40 MG tablet Take 1 tablet (40 mg total) by mouth as needed for migraine. may repeat in 2 hours if necessary 10 tablet 11  . Multiple Vitamins-Minerals (BARIATRIC MULTIVITAMINS/IRON PO) Take 1 tablet by mouth 2 (two) times daily.    . ondansetron (ZOFRAN-ODT) 4 MG disintegrating tablet Take 4 mg by mouth every 8 (eight) hours as needed for nausea or vomiting.    . pantoprazole (PROTONIX) 40 MG tablet Take 40 mg by mouth daily.    Marland Kitchen PROAIR HFA 108 (90 BASE) MCG/ACT inhaler Inhale 2 puffs into the lungs every 4 (four) hours as needed.  0  . triamcinolone cream (KENALOG) 0.1 % Apply 1 application topically 2 (two) times daily. 453.6 g 2   No current facility-administered medications for this visit.    Allergies  Allergen Reactions  . Sulfamethoxazole-Trimethoprim Nausea And Vomiting and Rash  . Penicillins Hives    Tolerates cephalosporins. Has patient had a PCN reaction causing immediate rash, facial/tongue/throat swelling, SOB or lightheadedness with hypotension: No Has patient had a PCN reaction causing severe rash involving mucus membranes or skin necrosis: No Has patient had a PCN reaction that required hospitalization: No Has patient had a PCN reaction occurring within the last 10 years: No If all of the above answers are "NO", then may proceed with Cephalosporin use.   . Prednisone Hives  . Latex Rash    Some gloves and tape cause red and raw skin. Skin peels  off.    Addendum to correct incorrect date due to templating error

## 2018-05-20 MED ORDER — METHYLPREDNISOLONE 4 MG PO TBPK: ORAL_TABLET | ORAL | 0 refills | Status: DC

## 2018-05-20 NOTE — Telephone Encounter (Signed)
Patient contacted me this morning stating that her rash is worsened.  She denies any systemic signs or symptoms such as fevers chills nausea vomiting diarrhea but she notes the itchy rash is extended and is very bothersome.  She continues use triamcinolone cream with little benefit.  Pictures provided as attached below.  Patient on the phone is in no acute distress.  She does not have any shortness of breath and she denies any fevers chills nausea vomiting or diarrhea.  Plan for Medrol Dosepak.  Patient notes that prednisone has caused her irritability in the past and she is unsure if she is ever tried Medrol as opposed to prednisone.  Plan to use systemic steroids given worsening symptoms with topical steroids.  Patient will keep me updated.  Total time spent on this encounter 11 minutes.

## 2018-05-24 ENCOUNTER — Encounter: Payer: Self-pay | Admitting: Family Medicine

## 2018-05-26 ENCOUNTER — Ambulatory Visit (INDEPENDENT_AMBULATORY_CARE_PROVIDER_SITE_OTHER): Payer: 59 | Admitting: Family Medicine

## 2018-05-26 ENCOUNTER — Encounter: Payer: Self-pay | Admitting: Family Medicine

## 2018-05-26 VITALS — Ht 66.5 in | Wt 207.4 lb

## 2018-05-26 DIAGNOSIS — A770 Spotted fever due to Rickettsia rickettsii: Secondary | ICD-10-CM | POA: Diagnosis not present

## 2018-05-26 DIAGNOSIS — E559 Vitamin D deficiency, unspecified: Secondary | ICD-10-CM

## 2018-05-26 DIAGNOSIS — Z5181 Encounter for therapeutic drug level monitoring: Secondary | ICD-10-CM | POA: Diagnosis not present

## 2018-05-26 DIAGNOSIS — R21 Rash and other nonspecific skin eruption: Secondary | ICD-10-CM

## 2018-05-26 MED ORDER — HYDROXYZINE HCL 50 MG PO TABS: 50.0000 mg | ORAL_TABLET | Freq: Every evening | ORAL | 3 refills | Status: DC | PRN

## 2018-05-26 MED ORDER — DOXYCYCLINE HYCLATE 100 MG PO TABS: 100.0000 mg | ORAL_TABLET | Freq: Two times a day (BID) | ORAL | 0 refills | Status: DC

## 2018-05-26 MED ORDER — CLOBETASOL PROPIONATE 0.05 % EX CREA: 1.0000 "application " | TOPICAL_CREAM | Freq: Two times a day (BID) | CUTANEOUS | 3 refills | Status: DC

## 2018-05-26 MED ORDER — PERMETHRIN 5 % EX CREA: TOPICAL_CREAM | CUTANEOUS | 1 refills | Status: DC

## 2018-05-26 NOTE — Progress Notes (Addendum)
Virtual Visit  via Video Note  I connected with      Barbara [name]  by a video enabled telemedicine application and verified that I am speaking with the correct person using two identifiers.   I discussed the limitations of evaluation and management by telemedicine and the availability of in person appointments. The patient expressed understanding and agreed to proceed.  History of Present Illness: Barbara [name] is a 43 y.o. female who would like to discuss rash.  Barbara [name] had a visit for rash on March 30.  She developed a rash starting on March 27.  Etiology of the rash was somewhat unclear but it was thought to possibly be contact dermatitis.  She was treated with triamcinolone cream.  When she failed to improve she was given a Medrol Dosepak which helped but not much.  She notes the rash continues.  She notes it is very itchy and has become plaque-like in areas.  She has a mild headache but otherwise feels well with no fevers chills body aches nausea vomiting or diarrhea.  She denies any medication sumatriptan shampoo.  She does not recall any known tick bite.  However she does occasionally venture into the woods.   No any else at home has a similar rash.   Observations/Objective: Ht 5' 6.5" (1.689 m)   Wt 207 lb 6.4 oz (94.1 kg)   BMI 32.97 kg/m  Wt Readings from Last 5 Encounters:  05/26/18 207 lb 6.4 oz (94.1 kg)  05/17/18 212 lb 12.8 oz (96.5 kg)  02/18/18 235 lb (106.6 kg)  01/06/18 250 lb (113.4 kg)  12/22/17 258 lb 14.4 oz (117.4 kg)   Exam: Appearance nontoxic-appearing Normal Speech.  Skin: As below           Lab and Radiology Results No results found for this or any previous visit (from the past 72 hour(s)). No results found.   Assessment and Plan: 43 y.o. female with rash.  Etiology is unclear.  Differential is still contact dermatitis or some similar etiology.  However more serious or other concerns are a possibility.  Rash has some of the  appearances of scabies it is reasonable avoid and treat empirically for scabies even though no one else at home has similar symptoms.  Additionally rash has some of the visual appearance of recommend spotted fever.  Fortunately she does not have other significant signs or symptoms aside from a mild headache.  Additionally she denies any known tick bites.  Regardless as she is not improving is reasonable to go ahead and treat with doxycycline empirically.  Additionally will obtain broad work-up.  Will check labs below including [facility] spotted fever antibodies Ehrlichia antibody panel as well as metabolic work-up.  She has a pertinent surgical history for gastric bypass a few months ago and this rash could be due to some mineral or vitamin deficiency.   Lastly we will refer to dermatology as a backup plan.  If not better that would certainly be a reasonable next step.  We will treat current rash also with clobetasol and hydroxyzine as medium potency steroid cream as not sufficient.  PDMP not reviewed this encounter. Orders Placed This Encounter  Procedures  . [place] spotted fvr abs pnl(IgG+IgM)  . Ehrlichia antibody panel  . VITAMIN D 25 Hydroxy (Vit-D Deficiency, Fractures)  . Folate  . Vitamin B12  . CMP14+EGFR  . CBC with Differential/Platelet  . Ambulatory referral to Dermatology    Referral Priority:  Routine    Referral Type:   Consultation    Referral Reason:   Specialty Services Required    Requested Specialty:   Dermatology    Number of Visits Requested:   1   Meds ordered this encounter  Medications  . doxycycline (VIBRA-TABS) 100 MG tablet    Sig: Take 1 tablet (100 mg total) by mouth 2 (two) times daily.    Dispense:  14 tablet    Refill:  0  . permethrin (ELIMITE) 5 % cream    Sig: Apply from the neck down at night and wash off in the morning once    Dispense:  180 g    Refill:  1  . clobetasol cream (TEMOVATE) 0.05 %    Sig: Apply 1 application topically 2  (two) times daily.    Dispense:  60 g    Refill:  3  . hydrOXYzine (ATARAX/VISTARIL) 50 MG tablet    Sig: Take 1 tablet (50 mg total) by mouth at bedtime and may repeat dose one time if needed. For itching.    Dispense:  30 tablet    Refill:  3    Follow Up Instructions:    I discussed the assessment and treatment plan with the patient. The patient was provided an opportunity to ask questions and all were answered. The patient agreed with the plan and demonstrated an understanding of the instructions.   The patient was advised to call back or seek an in-person evaluation if the symptoms worsen or if the condition fails to improve as anticipated.  I provided 25 minutes of non-face-to-face time during this encounter.    Historical information moved to improve visibility of documentation.  Past Medical History:  Diagnosis Date  . Anxiety   . Depression   . History of palpitations 2011   work-up with cardiologist with event monitor, showed no arrhythmias  . History of panic attacks   . Migraine headache   . Morbid obesity ([place])   . PONV (postoperative nausea and vomiting)    Past Surgical History:  Procedure Laterality Date  . D & C HYSTEROSCOPY W/ ENDOMETRIAL ABLATION  01-29-2005   dr Elonda Husky  _0   . ELBOW SURGERY Left   . ESOPHAGOGASTRODUODENOSCOPY N/A 05/18/2014   SLF: 1. Mild esophaigitis. 2. Mild non-erosive gastritis.   Marland Kitchen LAPAROSCOPIC GASTRIC SLEEVE RESECTION N/A 12/22/2017   Procedure: LAPAROSCOPIC GASTRIC SLEEVE RESECTION, UPPER ENDO, ERAS PATHWAY;  Surgeon: Clovis Riley, MD;  Location: [facility];  Service: General;  Laterality: N/A;  . ORIF RADIUS & ULNA FRACTURES Left 11-01-2006   dr Caralyn Guile _1   . ULNAR NERVE TRANSPOSITION Left 2011   AND REMOVAL HARDWARE OF WRIST   Social History   Tobacco Use  . Smoking status: Never Smoker  . Smokeless tobacco: Never Used  Substance Use Topics  . Alcohol use: No   family history includes Diabetes in her father and  paternal grandmother; Hyperlipidemia in her father and paternal grandmother; Hypertension in her father and paternal grandmother.  Medications: Current Outpatient Medications  Medication Sig Dispense Refill  . ALLEGRA-D ALLERGY & CONGESTION 180-240 MG 24 hr tablet Take 1 tablet by mouth daily as needed for allergies.  3  . ALPRAZolam (XANAX) 0.25 MG tablet Take 1 tablet (0.25 mg total) by mouth 2 (two) times daily as needed for anxiety. 60 tablet 1  . calcium-vitamin D (OSCAL WITH D) 500-200 MG-UNIT tablet Take 1 tablet by mouth 3 (three) times daily.    Marland Kitchen docusate sodium (COLACE)  100 MG capsule Take 1 capsule (100 mg total) by mouth 2 (two) times daily. OK to decrease or stop if having loose bowel movements 10 capsule 0  . eletriptan (RELPAX) 40 MG tablet Take 1 tablet (40 mg total) by mouth as needed for migraine. may repeat in 2 hours if necessary 10 tablet 11  . Multiple Vitamins-Minerals (BARIATRIC MULTIVITAMINS/IRON PO) Take 1 tablet by mouth 2 (two) times daily.    . ondansetron (ZOFRAN-ODT) 4 MG disintegrating tablet Take 4 mg by mouth every 8 (eight) hours as needed for nausea or vomiting.    . pantoprazole (PROTONIX) 40 MG tablet Take 40 mg by mouth daily.    Marland Kitchen PROAIR HFA 108 (90 BASE) MCG/ACT inhaler Inhale 2 puffs into the lungs every 4 (four) hours as needed.  0  . triamcinolone cream (KENALOG) 0.1 % Apply 1 application topically 2 (two) times daily. 453.6 g 2  . clobetasol cream (TEMOVATE) 9.45 % Apply 1 application topically 2 (two) times daily. 60 g 3  . doxycycline (VIBRA-TABS) 100 MG tablet Take 1 tablet (100 mg total) by mouth 2 (two) times daily. 14 tablet 0  . hydrOXYzine (ATARAX/VISTARIL) 50 MG tablet Take 1 tablet (50 mg total) by mouth at bedtime and may repeat dose one time if needed. For itching. 30 tablet 3  . permethrin (ELIMITE) 5 % cream Apply from the neck down at night and wash off in the morning once 180 g 1   No current facility-administered medications for this  visit.    Allergies  Allergen Reactions  . Sulfamethoxazole-Trimethoprim Nausea And Vomiting and Rash  . Penicillins Hives    Tolerates cephalosporins. Has patient had a PCN reaction causing immediate rash, facial/tongue/throat swelling, SOB or lightheadedness with hypotension: No Has patient had a PCN reaction causing severe rash involving mucus membranes or skin necrosis: No Has patient had a PCN reaction that required hospitalization: No Has patient had a PCN reaction occurring within the last 10 years: No If all of the above answers are "NO", then may proceed with Cephalosporin use.   . Prednisone Hives  . Latex Rash    Some gloves and tape cause red and raw skin. Skin peels off.      Addendum to correct incorrect date due to templating error

## 2018-05-26 NOTE — Patient Instructions (Signed)
Thank you for coming in today.  Start doxycycline now.  Use the clobetasol cream on the bad areas.  Take hydroxyzine at bedtime.  Apply the permetherin cream from the neck down and wash off in the morning for the remote chance this is scabies.  You should also hear from dermatology.   Get labs at [facility]   [address], [place] [id number]  MON - FRI 8:00AM-5:00PM CLSD LUNCH 1:00PM-1:30PM DRUG SCREENS 10:00-11:30A 1:30P-4:30P SATURDAY 8:00AM-12:00P Phone [phone] FAX [phone]   [facility] is a bacterial infection that spreads to people through contact with certain ticks. The illness causes flu-like symptoms and a reddish-purple rash. The illness does not spread from person to person (is not contagious). When this condition is not treated right away, it can quickly become very serious, and can sometimes lead to long-term (chronic) health problems or even death. What are the causes? This condition is caused by a type of bacteria (Rickettsia rickettsii) that is carried by [place] dog ticks, brown dog ticks, and [place] wood ticks. The infection spreads through:  A bite from an infected tick. Tick bites are usually painless, and they frequently are not noticed.  Infected tick blood, body fluids, or feces that get into the body through damaged skin, such as a small cut or sore. This could happen while removing a tick from a pet or from another person. What increases the risk? The following factors may make you more likely to develop this condition:  Spending a lot of time outdoors, especially in rural areas or areas with long grass.  Spending time outdoors during warm weather. Ticks are most active during warm weather. What are the signs or symptoms? Symptoms of this condition include:  Fever.  Muscle aches.  Headache.  Nausea.  Vomiting.  Poor appetite.  Abdominal pain.  A reddish-purple rash. ? This  usually appears 2-5 days after the first symptoms begin. ? The rash often starts on the wrists, forearms, and ankles. It may then spread to the palms, the bottom of the feet, legs, and trunk. Symptoms may develop 2-14 days after a tick bite. How is this diagnosed? This condition is diagnosed based on:  Your medical history.  A physical exam.  Blood tests.  Whether you have recently been bitten by a tick or spent time in areas where: ? Ticks are common. ? [facility] is common. How is this treated? This condition is treated with antibiotic medicines. It is important to begin treatment right away. In some cases, your health care provider may begin treatment before the diagnosis is confirmed. If your symptoms are severe, you may need to be treated in the hospital where you can get antibiotics and be monitored during treatment. Follow these instructions at home:  Take over-the-counter and prescription medicines only as told by your health care provider.  Take your antibiotic medicine as told by your health care provider. Do not stop taking the antibiotic even if you start to feel better.  Rest as much as possible until you feel better. Return to your normal activities as told by your health care provider.  Drink enough fluid to keep your urine pale yellow.  Keep all follow-up visits as told by your health care provider. This is important. Contact a health care provider if:  You have a rash that gets increasingly red or swollen.  You have fluid draining from any areas of your rash. Get help right away  if:  You develop a fever after being bitten by a tick.  You develop a rash 2-5 days after experiencing flu-like symptoms.  You have chest pain.  You have shortness of breath.  You have a severe headache.  You have jerky movements you cannot control (seizure).  You have severe abdominal pain.  You feel confused.  You bruise easily.  You have bleeding from  your gums.  You have blood in your stool (feces).  You have trouble controlling when you urinate or have bowel movements (incontinence).  You have vision problems.  You have numbness or tingling in your arms or legs. Summary  [facility] spotted fever is a bacterial infection that spreads to people through contact with certain ticks.  When this condition is not treated right away, it can quickly become very serious, and can sometimes lead to long-term (chronic) health problems or even death.  You are more likely to develop this infection if you spend time outdoors in warm weather and in areas with tall grass.  Symptoms of this condition include fever, headache, nausea, vomiting, abdominal pain, muscle aches, and a reddish-purple rash that usually appears 2-5 days after a fever.  This condition is treated with antibiotic medicines. This information is not intended to replace advice given to you by your health care provider. Make sure you discuss any questions you have with your health care provider. Document Released: 05/18/2000 Document Revised: 05/01/2016 Document Reviewed: 05/01/2016 Elsevier Interactive Patient Education  2019 Elsevier Inc.    Scabies, Adult Scabies is a skin condition that happens when very small insects get under the skin (infestation). This causes a rash and severe itchiness. Scabies can spread from person to person (is contagious). If you get scabies, it is common for others in your household to get scabies too. With proper treatment, symptoms usually go away in 2-4 weeks. Scabies usually does not cause lasting problems. What are the causes? This condition is caused by mites (Sarcoptes scabiei, or human itch mites) that can only be seen with a microscope. The mites get into the top layer of skin and lay eggs. Scabies can spread from person to person through:  Close contact with a person who has scabies.  Contact with infested items, such as towels,  bedding, or clothing. What increases the risk? This condition is more likely to develop in:  People who live in nursing homes and other extended-care facilities.  People who have sexual contact with a partner who has scabies.  Young children who attend child care facilities.  People who care for others who are at increased risk for scabies. What are the signs or symptoms? Symptoms of this condition may include:  Severe itchiness. This is often worse at night.  A rash that includes tiny red bumps or blisters. The rash commonly occurs on the wrist, elbow, armpit, fingers, waist, groin, or buttocks. Bumps may form a line (burrow) in some areas.  Skin irritation. This can include scaly patches or sores. How is this diagnosed? This condition is diagnosed with a physical exam. Your health care provider will look closely at your skin. In some cases, your health care provider may take a sample of your affected skin (skin scraping) and have it examined under a microscope. How is this treated? This condition may be treated with:  Medicated cream or lotion that kills the mites. This is spread on the entire body and left on for several hours. Usually, one treatment with medicated cream or lotion is enough  to kill all of the mites. In severe cases, the treatment may be repeated.  Medicated cream that relieves itching.  Medicines that help to relieve itching.  Medicines that kill the mites. This treatment is rarely used. Follow these instructions at home:  Medicines  Take or apply over-the-counter and prescription medicines as told by your health care provider.  Apply medicated cream or lotion as told by your health care provider.  Do not wash off the medicated cream or lotion until the necessary amount of time has passed. Skin Care  Avoid scratching your affected skin.  Keep your fingernails closely trimmed to reduce injury from scratching.  Take cool baths or apply cool washcloths  to help reduce itching. General instructions  Clean all items that you recently had contact with, including bedding, clothing, and furniture. Do this on the same day that your treatment starts. ? Use hot water when you wash items. ? Place unwashable items into closed, airtight plastic bags for at least 3 days. The mites cannot live for more than 3 days away from human skin. ? Vacuum furniture and mattresses that you use.  Make sure that other people who may have been infested are examined by a health care provider. These include members of your household and anyone who may have had contact with infested items.  Keep all follow-up visits as told by your health care provider. This is important. Contact a health care provider if:  You have itching that does not go away after 4 weeks of treatment.  You continue to develop new bumps or burrows.  You have redness, swelling, or pain in your rash area after treatment.  You have fluid, blood, or pus coming from your rash. This information is not intended to replace advice given to you by your health care provider. Make sure you discuss any questions you have with your health care provider. Document Released: 10/25/2014 Document Revised: 07/12/2015 Document Reviewed: 09/05/2014 Elsevier Interactive Patient Education  2019 [employer].

## 2018-05-27 DIAGNOSIS — Z5181 Encounter for therapeutic drug level monitoring: Secondary | ICD-10-CM | POA: Diagnosis not present

## 2018-05-27 DIAGNOSIS — E559 Vitamin D deficiency, unspecified: Secondary | ICD-10-CM | POA: Diagnosis not present

## 2018-05-27 DIAGNOSIS — R21 Rash and other nonspecific skin eruption: Secondary | ICD-10-CM | POA: Diagnosis not present

## 2018-05-29 LAB — CMP14+EGFR
ALT: 21 [IU]/L (ref 0–32)
AST: 17 [IU]/L (ref 0–40)
Albumin/Globulin Ratio: 1.8 (ref 1.2–2.2)
Albumin: 4.2 g/dL (ref 3.8–4.8)
Alkaline Phosphatase: 70 [IU]/L (ref 39–117)
BUN/Creatinine Ratio: 18 (ref 9–23)
BUN: 14 mg/dL (ref 6–24)
Bilirubin Total: 0.4 mg/dL (ref 0.0–1.2)
CO2: 25 mmol/L (ref 20–29)
Calcium: 9.4 mg/dL (ref 8.7–10.2)
Chloride: 100 mmol/L (ref 96–106)
Creatinine, Ser: 0.79 mg/dL (ref 0.57–1.00)
GFR calc Af Amer: 105 mL/min/{1.73_m2}
GFR calc non Af Amer: 91 mL/min/{1.73_m2}
Globulin, Total: 2.3 g/dL (ref 1.5–4.5)
Glucose: 96 mg/dL (ref 65–99)
Potassium: 4.1 mmol/L (ref 3.5–5.2)
Sodium: 141 mmol/L (ref 134–144)
Total Protein: 6.5 g/dL (ref 6.0–8.5)

## 2018-05-29 LAB — CBC WITH DIFFERENTIAL/PLATELET
Basophils Absolute: 0.1 10*3/uL (ref 0.0–0.2)
Basos: 0 %
EOS (ABSOLUTE): 0.2 10*3/uL (ref 0.0–0.4)
Eos: 1 %
Hematocrit: 47.1 % — ABNORMAL HIGH (ref 34.0–46.6)
Hemoglobin: 16.3 g/dL — ABNORMAL HIGH (ref 11.1–15.9)
Immature Grans (Abs): 0 10*3/uL (ref 0.0–0.1)
Immature Granulocytes: 0 %
Lymphocytes Absolute: 3.8 10*3/uL — ABNORMAL HIGH (ref 0.7–3.1)
Lymphs: 26 %
MCH: 31.7 pg (ref 26.6–33.0)
MCHC: 34.6 g/dL (ref 31.5–35.7)
MCV: 92 fL (ref 79–97)
Monocytes Absolute: 0.9 10*3/uL (ref 0.1–0.9)
Monocytes: 6 %
Neutrophils Absolute: 9.8 10*3/uL — ABNORMAL HIGH (ref 1.4–7.0)
Neutrophils: 67 %
Platelets: 473 10*3/uL — ABNORMAL HIGH (ref 150–450)
RBC: 5.14 x10E6/uL (ref 3.77–5.28)
RDW: 11.9 % (ref 11.7–15.4)
WBC: 14.8 10*3/uL — ABNORMAL HIGH (ref 3.4–10.8)

## 2018-05-29 LAB — EHRLICHIA ANTIBODY PANEL
E. Chaffeensis (HME) IgM Titer: NEGATIVE
E.Chaffeensis (HME) IgG: NEGATIVE
HGE IgG Titer: NEGATIVE
HGE IgM Titer: NEGATIVE

## 2018-05-29 LAB — ROCKY MTN SPOTTED FVR ABS PNL(IGG+IGM)
RMSF IgG: NEGATIVE
RMSF IgM: 2.56 {index} — ABNORMAL HIGH (ref 0.00–0.89)

## 2018-05-29 LAB — VITAMIN D 25 HYDROXY (VIT D DEFICIENCY, FRACTURES): Vit D, 25-Hydroxy: 60.4 ng/mL (ref 30.0–100.0)

## 2018-05-29 LAB — VITAMIN B12: Vitamin B-12: 1034 pg/mL (ref 232–1245)

## 2018-05-29 LAB — FOLATE: Folate: 15.2 ng/mL

## 2018-05-31 ENCOUNTER — Encounter: Payer: Self-pay | Admitting: Family Medicine

## 2018-05-31 DIAGNOSIS — A770 Spotted fever due to Rickettsia rickettsii: Secondary | ICD-10-CM | POA: Insufficient documentation

## 2018-05-31 MED ORDER — DOXYCYCLINE HYCLATE 100 MG PO TABS: 100.0000 mg | ORAL_TABLET | Freq: Two times a day (BID) | ORAL | 0 refills | Status: DC

## 2018-05-31 NOTE — Addendum Note (Signed)
Addended by: Rodolph Bong on: 05/31/2018 07:16 AM   Modules accepted: Orders

## 2018-06-01 ENCOUNTER — Encounter: Payer: 59 | Attending: Surgery | Admitting: Skilled Nursing Facility1

## 2018-06-01 ENCOUNTER — Other Ambulatory Visit: Payer: Self-pay

## 2018-06-01 ENCOUNTER — Ambulatory Visit: Payer: Self-pay

## 2018-06-01 DIAGNOSIS — Z833 Family history of diabetes mellitus: Secondary | ICD-10-CM | POA: Diagnosis not present

## 2018-06-01 DIAGNOSIS — Z713 Dietary counseling and surveillance: Secondary | ICD-10-CM | POA: Insufficient documentation

## 2018-06-01 DIAGNOSIS — Z791 Long term (current) use of non-steroidal anti-inflammatories (NSAID): Secondary | ICD-10-CM | POA: Insufficient documentation

## 2018-06-01 DIAGNOSIS — M549 Dorsalgia, unspecified: Secondary | ICD-10-CM | POA: Diagnosis not present

## 2018-06-01 DIAGNOSIS — Z79899 Other long term (current) drug therapy: Secondary | ICD-10-CM | POA: Diagnosis not present

## 2018-06-01 DIAGNOSIS — F419 Anxiety disorder, unspecified: Secondary | ICD-10-CM | POA: Insufficient documentation

## 2018-06-01 DIAGNOSIS — Z888 Allergy status to other drugs, medicaments and biological substances status: Secondary | ICD-10-CM | POA: Diagnosis not present

## 2018-06-01 DIAGNOSIS — Z9889 Other specified postprocedural states: Secondary | ICD-10-CM | POA: Insufficient documentation

## 2018-06-01 DIAGNOSIS — E079 Disorder of thyroid, unspecified: Secondary | ICD-10-CM | POA: Insufficient documentation

## 2018-06-01 DIAGNOSIS — Z8249 Family history of ischemic heart disease and other diseases of the circulatory system: Secondary | ICD-10-CM | POA: Insufficient documentation

## 2018-06-01 DIAGNOSIS — Z8261 Family history of arthritis: Secondary | ICD-10-CM | POA: Insufficient documentation

## 2018-06-01 DIAGNOSIS — Z880 Allergy status to penicillin: Secondary | ICD-10-CM | POA: Insufficient documentation

## 2018-06-01 NOTE — Progress Notes (Signed)
Bariatric Follow-Up Visit  Post-Operative Sleeve Surgery Medical Nutrition Therapy  Appt Start Time: 5:15 End Time: 6:00  Pt was seen on 02/18/2018 for Post-Operative Bariatric Surgery Nutrition Management.   Pt states she currently has been diagnosed with [place] spotted fever so she is currently on an antibiotic. Pt states she currently does not have an appetite. Pt states she does not like vegetables nor has she ever: dietitian educated the pt on the bodies need for non starchy vegetables. Pt state she will try yellow squash now. Dietitian encouraged pt to continue to try new non starchy vegetables. Pt states she keeps up with her weekly weigh is writing on her mirror. Pt states she got a new job and her son who has autistic got a job. Pt states her daughter is engaged.   NUTRITION ASSESSMENT  Anthropometrics  Start weight at [facility]: 277 lbs (Date: 04/30/2017) Today's weight: 235 lbs Weight change: 27.2 lbs total (since last appt)  Body Composition Scale 06/01/2018  Total Body Fat % 38.6  Visceral Fat 10  Fat-Free Mass % 61.3   Total Body Water % 45.1   Muscle-Mass lbs 33.2  Body Fat Displacement          Torso  lbs 49.6         Left Leg  lbs 9.9         Right Leg  lbs 9.9         Left Arm  lbs 4.9         Right Arm   lbs 4.9    Clinical Medications: see list Supplements: bariatric MVI   24-Hr Dietary Recall First Meal: protein cookie bites (Kay's) or protein bar 13g Snack:   Second Meal: oikos yogurt with cheese  Snack: none  Third Meal: real good frozen meals  (22g)  Snack: none Beverages: water w/ Crystal Light + G2 + Powerade Zero + Gold Peak diet tea  Food & Nutrition Related Hx Dietary Hx: Pt states she tolerates scrambled eggs with cheese, chicken, shrimp, steak, pepperoni, and protein bars/shakes. Pt states she and her husband have researched bariatric sleeve air fryer recipes to try. Pt does not like non-starchy vegetables  Estimated Daily Fluid Intake:  64-80 oz Estimated Daily Protein Intake: 75 g  Physical Activity  Current average weekly physical activity: walking 3 days a week inconsistent with some dancing at home   Post-Op Goals Using straws: No Drinking while eating: No Chewing/swallowing difficulties: No  Changes in vision: No Changes to mood/headaches: No Hair loss/changes to skin/nails: No Difficulty focusing/concentrating: No Sweating: No Dizziness/lightheadedness: Yes (twice within the past few days, thinks b/c stood up too fast)  Palpitations: No   N/V/D/C/Gas: No  Abdominal pain: No  Dumping syndrome: no   NUTRITION DIAGNOSIS  Overweight/obesity ([place]-3.3) related to past poor dietary habits and physical inactivity as evidenced by patient w/ completed sleeve gastrectomy surgery following dietary guidelines for continued weight loss.   NUTRITION INTERVENTION Nutrition counseling (C-1) and education (E-2) to facilitate bariatric surgery goals, including: . Diet advancement to the next phase now including starchy vegetables . The importance of consuming adequate calories as well as certain nutrients daily due to the body's need for essential vitamins, minerals, and fats . The importance of daily physical activity and to reach a goal of at least 150 minutes of moderate to vigorous physical activity weekly (or as directed by their physician) due to benefits such as increased musculature and improved lab values  Handouts Provided Include  Phase IV: Protein + Non-Starchy Vegetables  Learning Style & Readiness for Change Teaching method utilized: Visual & Auditory  Demonstrated degree of understanding via: Teach Back  Barriers to learning/adherence to lifestyle change: None Identified   MONITORING & EVALUATION Dietary intake, weekly physical activity, body weight, and multivitamin.  Next Steps Patient is to follow-up in 2-3 months for 9 month post-op visit. Patient was advised to call [facility] with questions/concerns  or to schedule an additional appointment in the meantime if needed.

## 2018-11-26 ENCOUNTER — Other Ambulatory Visit: Payer: Self-pay

## 2018-11-26 ENCOUNTER — Ambulatory Visit (INDEPENDENT_AMBULATORY_CARE_PROVIDER_SITE_OTHER): Payer: 59 | Admitting: Family Medicine

## 2018-11-26 VITALS — BP 125/84 | HR 75 | Wt 183.0 lb

## 2018-11-26 DIAGNOSIS — E039 Hypothyroidism, unspecified: Secondary | ICD-10-CM | POA: Diagnosis not present

## 2018-11-26 DIAGNOSIS — Z6829 Body mass index (BMI) 29.0-29.9, adult: Secondary | ICD-10-CM

## 2018-11-26 DIAGNOSIS — Z9884 Bariatric surgery status: Secondary | ICD-10-CM

## 2018-11-26 DIAGNOSIS — Z0000 Encounter for general adult medical examination without abnormal findings: Secondary | ICD-10-CM

## 2018-11-26 DIAGNOSIS — Z23 Encounter for immunization: Secondary | ICD-10-CM | POA: Diagnosis not present

## 2018-11-26 NOTE — Patient Instructions (Addendum)
Thank you for coming in today.  Get labs today.  I will submit the form when I get labs back on Monday.  Recheck yearly if all is well.  Make sure Dr Elonda Husky send results to me.

## 2018-11-26 NOTE — Progress Notes (Signed)
Barbara [name] is a 43 y.o. female who presents to [facility] today for well adult visit.   [name] has done very well in the last year.  She had bariatric surgery gastric sleeve December 26, 2017.  Since then she is lost about 100 pounds and feels much better.  She notes her aches and pains and migraines have resolved.  She feels great.  She is adherent to a high-protein low calorie diet.  He also has increased her x-rays quite a bit as well.  Patient had a mystery rash in the spring of this year that ultimately was discovered to be due to [facility] spotted fever with confirmed IgM antibody test.  She has a follow-up appointment scheduled with her OB/GYN doctor Dr. Elonda Husky in [place] next month for cervical cancer and breast cancer screening.  ROS as above:  Past Medical History:  Diagnosis Date  . Anxiety   . Depression   . History of palpitations 2011   work-up with cardiologist with event monitor, showed no arrhythmias  . History of panic attacks   . Migraine headache   . Morbid obesity ([place])   . PONV (postoperative nausea and vomiting)    Past Surgical History:  Procedure Laterality Date  . D & C HYSTEROSCOPY W/ ENDOMETRIAL ABLATION  01-29-2005   dr Elonda Husky  @[facility]   . ELBOW SURGERY Left   . ESOPHAGOGASTRODUODENOSCOPY N/A 05/18/2014   SLF: 1. Mild esophaigitis. 2. Mild non-erosive gastritis.   Marland Kitchen LAPAROSCOPIC GASTRIC SLEEVE RESECTION N/A 12/22/2017   Procedure: LAPAROSCOPIC GASTRIC SLEEVE RESECTION, UPPER ENDO, ERAS PATHWAY;  Surgeon: Clovis Riley, MD;  Location: [facility];  Service: General;  Laterality: N/A;  . ORIF RADIUS & ULNA FRACTURES Left 11-01-2006   dr Caralyn Guile @[facility]   . ULNAR NERVE TRANSPOSITION Left 2011   AND REMOVAL HARDWARE OF WRIST   Social History   Tobacco Use  . Smoking status: Never Smoker  . Smokeless tobacco: Never Used  Substance Use  Topics  . Alcohol use: No   family history includes Diabetes in her father and paternal grandmother; Hyperlipidemia in her father and paternal grandmother; Hypertension in her father and paternal grandmother.  Medications: Current Outpatient Medications  Medication Sig Dispense Refill  . calcium-vitamin D (OSCAL WITH D) 500-200 MG-UNIT tablet Take 1 tablet by mouth 3 (three) times daily.    . cetirizine (ZYRTEC) 10 MG tablet Take 10 mg by mouth daily.    . Multiple Vitamins-Minerals (BARIATRIC MULTIVITAMINS/IRON PO) Take 1 tablet by mouth 2 (two) times daily.     No current facility-administered medications for this visit.    Allergies  Allergen Reactions  . Sulfamethoxazole-Trimethoprim Nausea And Vomiting and Rash  . Penicillins Hives    Tolerates cephalosporins. Has patient had a PCN reaction causing immediate rash, facial/tongue/throat swelling, SOB or lightheadedness with hypotension: No Has patient had a PCN reaction causing severe rash involving mucus membranes or skin necrosis: No Has patient had a PCN reaction that required hospitalization: No Has patient had a PCN reaction occurring within the last 10 years: No If all of the above answers are "NO", then may proceed with Cephalosporin use.   . Prednisone Hives  . Latex Rash    Some gloves and tape cause red and raw skin. Skin peels off.     Health Maintenance Health Maintenance  Topic Date Due  . HIV Screening  12/16/1988  . TETANUS/TDAP  12/16/1992  . INFLUENZA VACCINE  09/18/2018  . PAP SMEAR-Modifier  03/26/2019     Exam:  BP 125/84   Pulse 75   Wt 183 lb (83 kg)   BMI 29.09 kg/m Waist circumference 40 inches Wt Readings from Last 5 Encounters:  11/26/18 183 lb (83 kg)  06/01/18 207 lb 12.8 oz (94.3 kg)  05/26/18 207 lb 6.4 oz (94.1 kg)  05/17/18 212 lb 12.8 oz (96.5 kg)  02/18/18 235 lb (106.6 kg)     Gen: Well NAD HEENT: EOMI,  MMM Lungs: Normal work of breathing. CTABL Heart: RRR no MRG Abd:  NABS, Soft. Nondistended, Nontender Exts: Brisk capillary refill, warm and well perfused.  Psych: Alert and oriented normal speech thought process and affect.  Depression screen [facility] 2/9 11/26/2018 11/02/2017 09/29/2017 08/28/2017 04/30/2017  Decreased Interest 0 0 0 0 0  Down, Depressed, Hopeless 0 0 0 0 0  PHQ - 2 Score 0 0 0 0 0        Assessment and Plan: 43 y.o. female with  Well adult.  Doing quite well.  Plan to get some basic fasting labs.  Will check CBC metabolic panel lipid panel and TSH.  Vitamin D and B12 were checked in April and were normal.  Plan for continued conservative management and healthy lifestyle and exercise.  We will get cervical cancer breast cancer screening at OB/GYN.  We will complete physical form and send off to her employer when labs are back on Monday.  Recheck yearly if all is well.  Tdap and flu vaccine given today prior to discharge.  PDMP not reviewed this encounter. Orders Placed This Encounter  Procedures  . Flu Vaccine QUAD 36+ mos IM  . Tdap vaccine greater than or equal to 7yo IM  . CBC  . COMPLETE METABOLIC PANEL WITH GFR  . Lipid Panel w/reflex Direct LDL  . TSH   No orders of the defined types were placed in this encounter.    Discussed warning signs or symptoms. Please see discharge instructions. Patient expresses understanding.

## 2018-11-27 LAB — COMPLETE METABOLIC PANEL WITH GFR
AG Ratio: 2 (calc) (ref 1.0–2.5)
ALT: 27 U/L (ref 6–29)
AST: 25 U/L (ref 10–30)
Albumin: 4.1 g/dL (ref 3.6–5.1)
Alkaline phosphatase (APISO): 54 U/L (ref 31–125)
BUN: 13 mg/dL (ref 7–25)
CO2: 30 mmol/L (ref 20–32)
Calcium: 9.4 mg/dL (ref 8.6–10.2)
Chloride: 103 mmol/L (ref 98–110)
Creat: 0.95 mg/dL (ref 0.50–1.10)
GFR, Est African American: 84 mL/min/{1.73_m2} (ref >=60)
GFR, Est Non African American: 73 mL/min/{1.73_m2} (ref >=60)
Globulin: 2.1 g/dL (calc) (ref 1.9–3.7)
Glucose, Bld: 85 mg/dL (ref 65–99)
Potassium: 4.2 mmol/L (ref 3.5–5.3)
Sodium: 139 mmol/L (ref 135–146)
Total Bilirubin: 0.8 mg/dL (ref 0.2–1.2)
Total Protein: 6.2 g/dL (ref 6.1–8.1)

## 2018-11-27 LAB — LIPID PANEL W/REFLEX DIRECT LDL
Cholesterol: 150 mg/dL (ref <200)
HDL: 55 mg/dL (ref >=50)
LDL Cholesterol (Calc): 82 mg/dL (calc)
Non-HDL Cholesterol (Calc): 95 mg/dL (calc) (ref <130)
Total CHOL/HDL Ratio: 2.7 (calc) (ref <5.0)
Triglycerides: 58 mg/dL (ref <150)

## 2018-11-27 LAB — CBC
HCT: 46.4 % — ABNORMAL HIGH (ref 35.0–45.0)
Hemoglobin: 15.7 g/dL — ABNORMAL HIGH (ref 11.7–15.5)
MCH: 31.7 pg (ref 27.0–33.0)
MCHC: 33.8 g/dL (ref 32.0–36.0)
MCV: 93.5 fL (ref 80.0–100.0)
MPV: 10.6 fL (ref 7.5–12.5)
Platelets: 397 10*3/uL (ref 140–400)
RBC: 4.96 10*6/uL (ref 3.80–5.10)
RDW: 12.5 % (ref 11.0–15.0)
WBC: 7.3 10*3/uL (ref 3.8–10.8)

## 2018-11-27 LAB — TSH: TSH: 1.38 mIU/L

## 2018-11-29 ENCOUNTER — Encounter: Payer: Self-pay | Admitting: Family Medicine

## 2019-01-06 ENCOUNTER — Other Ambulatory Visit: Payer: Self-pay

## 2019-01-06 ENCOUNTER — Encounter: Payer: Self-pay | Admitting: Obstetrics & Gynecology

## 2019-01-06 ENCOUNTER — Other Ambulatory Visit (HOSPITAL_COMMUNITY): Payer: Self-pay | Admitting: Obstetrics & Gynecology

## 2019-01-06 ENCOUNTER — Ambulatory Visit (INDEPENDENT_AMBULATORY_CARE_PROVIDER_SITE_OTHER): Payer: 59 | Admitting: Obstetrics & Gynecology

## 2019-01-06 ENCOUNTER — Other Ambulatory Visit (HOSPITAL_COMMUNITY)
Admission: RE | Admit: 2019-01-06 | Discharge: 2019-01-06 | Disposition: A | Discharge status: Home / Self Care | Payer: 59 | Source: Ambulatory Visit | Attending: Obstetrics & Gynecology | Admitting: Obstetrics & Gynecology

## 2019-01-06 VITALS — BP 126/81 | HR 81 | Ht 66.5 in | Wt 187.0 lb

## 2019-01-06 DIAGNOSIS — Z01419 Encounter for gynecological examination (general) (routine) without abnormal findings: Secondary | ICD-10-CM | POA: Insufficient documentation

## 2019-01-06 DIAGNOSIS — Z124 Encounter for screening for malignant neoplasm of cervix: Secondary | ICD-10-CM | POA: Insufficient documentation

## 2019-01-06 DIAGNOSIS — Z1231 Encounter for screening mammogram for malignant neoplasm of breast: Secondary | ICD-10-CM

## 2019-01-06 NOTE — Progress Notes (Signed)
Subjective:     Barbara [name] is a 43 y.o. female here for a routine exam.  No LMP recorded. Patient has had an ablation. [id number] Birth Control Method:  ablation Menstrual Calendar(currently): amenorrheic s/p ablation  Current complaints: none.   Current acute medical issues:  none   Recent Gynecologic History No LMP recorded. Patient has had an ablation. Last Pap: 2018,  normal Last mammogram: 10/2015,  normal  Past Medical History:  Diagnosis Date  . Anxiety   . Depression   . History of palpitations 2011   work-up with cardiologist with event monitor, showed no arrhythmias  . History of panic attacks   . Migraine headache   . Morbid obesity (HCC)   . PONV (postoperative nausea and vomiting)     Past Surgical History:  Procedure Laterality Date  . D & C HYSTEROSCOPY W/ ENDOMETRIAL ABLATION  01-29-2005   dr Despina Hiddeneure  @[facility]   . ELBOW SURGERY Left   . ESOPHAGOGASTRODUODENOSCOPY N/A 05/18/2014   SLF: 1. Mild esophaigitis. 2. Mild non-erosive gastritis.   Marland Kitchen. LAPAROSCOPIC GASTRIC SLEEVE RESECTION N/A 12/22/2017   Procedure: LAPAROSCOPIC GASTRIC SLEEVE RESECTION, UPPER ENDO, ERAS PATHWAY;  Surgeon: Berna Bueonnor, Chelsea A, MD;  Location: [facility];  Service: General;  Laterality: N/A;  . ORIF RADIUS & ULNA FRACTURES Left 11-01-2006   dr Melvyn Novasortmann @[facility]   . ULNAR NERVE TRANSPOSITION Left 2011   AND REMOVAL HARDWARE OF WRIST    OB History    Gravida  2   Para  2   Term  2   Preterm      AB      Living  2     SAB      TAB      Ectopic      Multiple      Live Births  2           Social History   Socioeconomic History  . Marital status: Married    Spouse name: Not on file  . Number of children: Not on file  . Years of education: Not on file  . Highest education level: Not on file  Occupational History  . Not on file  Social Needs  . Financial resource strain: Not on file  . Food insecurity    Worry: Not on file    Inability: Not on file  . Transportation needs   Medical: Not on file    Non-medical: Not on file  Tobacco Use  . Smoking status: Never Smoker  . Smokeless tobacco: Never Used  Substance and Sexual Activity  . Alcohol use: No  . Drug use: No  . Sexual activity: Yes    Birth control/protection: Other-see comments    Comment: vasectomy; ablation  Lifestyle  . Physical activity    Days per week: Not on file    Minutes per session: Not on file  . Stress: Not on file  Relationships  . Social Musicianconnections    Talks on phone: Not on file    Gets together: Not on file    Attends religious service: Not on file    Active member of club or organization: Not on file    Attends meetings of clubs or organizations: Not on file    Relationship status: Not on file  Other Topics Concern  . Not on file  Social History Narrative  . Not on file    Family History  Problem Relation Age of Onset  . Hypertension Father   . Hyperlipidemia Father   .  Diabetes Father   . Diabetes Paternal Grandmother   . Hyperlipidemia Paternal Grandmother   . Hypertension Paternal Grandmother   . Colon cancer Neg Hx      Current Outpatient Medications:  .  Calcium-Phosphorus-Vitamin D (CALCIUM/D3 ADULT GUMMIES PO), Take by mouth 3 (three) times daily., Disp: , Rfl:  .  cetirizine (ZYRTEC) 10 MG tablet, Take 10 mg by mouth daily., Disp: , Rfl:  .  Multiple Vitamins-Minerals (BARIATRIC MULTIVITAMINS/IRON PO), Take 1 tablet by mouth 2 (two) times daily., Disp: , Rfl:   Review of Systems  Review of Systems  Constitutional: Negative for fever, chills, weight loss, malaise/fatigue and diaphoresis.  HENT: Negative for hearing loss, ear pain, nosebleeds, congestion, sore throat, neck pain, tinnitus and ear discharge.   Eyes: Negative for blurred vision, double vision, photophobia, pain, discharge and redness.  Respiratory: Negative for cough, hemoptysis, sputum production, shortness of breath, wheezing and stridor.   Cardiovascular: Negative for chest pain,  palpitations, orthopnea, claudication, leg swelling and PND.  Gastrointestinal: negative for abdominal pain. Negative for heartburn, nausea, vomiting, diarrhea, constipation, blood in stool and melena.  Genitourinary: Negative for dysuria, urgency, frequency, hematuria and flank pain.  Musculoskeletal: Negative for myalgias, back pain, joint pain and falls.  Skin: Negative for itching and rash.  Neurological: Negative for dizziness, tingling, tremors, sensory change, speech change, focal weakness, seizures, loss of consciousness, weakness and headaches.  Endo/Heme/Allergies: Negative for environmental allergies and polydipsia. Does not bruise/bleed easily.  Psychiatric/Behavioral: Negative for depression, suicidal ideas, hallucinations, memory loss and substance abuse. The patient is not nervous/anxious and does not have insomnia.        Objective:  Blood pressure 126/81, pulse 81, height 5' 6.5" (1.689 m), weight 187 lb (84.8 kg).   Physical Exam  Vitals reviewed. Constitutional: She is oriented to person, place, and time. She appears well-developed and well-nourished.  HENT:  Head: Normocephalic and atraumatic.        Right Ear: External ear normal.  Left Ear: External ear normal.  Nose: Nose normal.  Mouth/Throat: Oropharynx is clear and moist.  Eyes: Conjunctivae and EOM are normal. Pupils are equal, round, and reactive to light. Right eye exhibits no discharge. Left eye exhibits no discharge. No scleral icterus.  Neck: Normal range of motion. Neck supple. No tracheal deviation present. No thyromegaly present.  Cardiovascular: Normal rate, regular rhythm, normal heart sounds and intact distal pulses.  Exam reveals no gallop and no friction rub.   No murmur heard. Respiratory: Effort normal and breath sounds normal. No respiratory distress. She has no wheezes. She has no rales. She exhibits no tenderness.  GI: Soft. Bowel sounds are normal. She exhibits no distension and no mass. There  is no tenderness. There is no rebound and no guarding.  Genitourinary:  Breasts no masses skin changes or nipple changes bilaterally      Vulva is normal without lesions Vagina is pink moist without discharge Cervix normal in appearance and pap is done Uterus is normal size shape and contour Adnexa is negative with normal sized ovaries   Musculoskeletal: Normal range of motion. She exhibits no edema and no tenderness.  Neurological: She is alert and oriented to person, place, and time. She has normal reflexes. She displays normal reflexes. No cranial nerve deficit. She exhibits normal muscle tone. Coordination normal.  Skin: Skin is warm and dry. No rash noted. No erythema. No pallor.  Psychiatric: She has a normal mood and affect. Her behavior is normal. Judgment and thought content normal.  Medications Ordered at today's visit: No orders of the defined types were placed in this encounter.   Other orders placed at today's visit: Orders Placed This Encounter  Procedures  . [facility] MAMMOGRAPHY      Assessment:    Healthy female exam.    Plan:    Mammogram ordered. Follow up in: 3 years.   Or as needed  No follow-ups on file.

## 2019-01-07 LAB — CYTOLOGY - PAP
Comment: NEGATIVE
Diagnosis: NEGATIVE
High risk HPV: NEGATIVE

## 2019-01-24 ENCOUNTER — Ambulatory Visit (HOSPITAL_COMMUNITY): Payer: 59

## 2019-01-31 ENCOUNTER — Encounter (HOSPITAL_COMMUNITY): Payer: Self-pay

## 2019-01-31 ENCOUNTER — Ambulatory Visit (HOSPITAL_COMMUNITY)
Admission: RE | Admit: 2019-01-31 | Discharge: 2019-01-31 | Disposition: A | Discharge status: Home / Self Care | Payer: 59 | Source: Ambulatory Visit | Attending: Obstetrics & Gynecology | Admitting: Obstetrics & Gynecology

## 2019-01-31 ENCOUNTER — Other Ambulatory Visit: Payer: Self-pay

## 2019-01-31 DIAGNOSIS — Z1231 Encounter for screening mammogram for malignant neoplasm of breast: Secondary | ICD-10-CM | POA: Insufficient documentation

## 2019-09-06 ENCOUNTER — Ambulatory Visit (INDEPENDENT_AMBULATORY_CARE_PROVIDER_SITE_OTHER): Payer: 59 | Admitting: Family Medicine

## 2019-09-06 ENCOUNTER — Encounter: Payer: Self-pay | Admitting: Family Medicine

## 2019-09-06 VITALS — BP 118/87 | HR 76 | Temp 97.8°F | Ht 66.5 in | Wt 195.7 lb

## 2019-09-06 DIAGNOSIS — Z13220 Encounter for screening for lipoid disorders: Secondary | ICD-10-CM | POA: Diagnosis not present

## 2019-09-06 DIAGNOSIS — Z0000 Encounter for general adult medical examination without abnormal findings: Secondary | ICD-10-CM | POA: Diagnosis not present

## 2019-09-06 DIAGNOSIS — E039 Hypothyroidism, unspecified: Secondary | ICD-10-CM

## 2019-09-06 NOTE — Progress Notes (Signed)
CHARRIE MCCONNON - 44 y.o. female MRN [id number]  Date of birth: [birth year]  Subjective Chief Complaint  Patient presents with  . Establish Care    HPI Barbara [name] is a 44 y.o. female here today for annual exam.  She has been in pretty good health and feels much better since her gastric sleeve.  Reports that weight is down almost 100lbs since surgery in 2019.  She denies any new concerns today.    She has her GYN care with Dr. Despina Hidden with last pap in 12/2018.  This was normal.   She is running some with her dogs for exercise.  She tries to follow a healthy diet most of the time.   She is a non-smoker and denies EtOH use.   Review of Systems  Constitutional: Negative for chills, fever, malaise/fatigue and weight loss.  HENT: Negative for congestion, ear pain and sore throat.   Eyes: Negative for blurred vision, double vision and pain.  Respiratory: Negative for cough and shortness of breath.   Cardiovascular: Negative for chest pain and palpitations.  Gastrointestinal: Negative for abdominal pain, blood in stool, constipation, heartburn and nausea.  Genitourinary: Negative for dysuria and urgency.  Musculoskeletal: Negative for joint pain and myalgias.  Neurological: Negative for dizziness and headaches.  Endo/Heme/Allergies: Does not bruise/bleed easily.  Psychiatric/Behavioral: Negative for depression. The patient is not nervous/anxious and does not have insomnia.     Allergies  Allergen Reactions  . Sulfamethoxazole-Trimethoprim Nausea And Vomiting and Rash  . Ibuprofen     Had gastric sleeve  . Penicillins Hives    Tolerates cephalosporins. Has patient had a PCN reaction causing immediate rash, facial/tongue/throat swelling, SOB or lightheadedness with hypotension: No Has patient had a PCN reaction causing severe rash involving mucus membranes or skin necrosis: No Has patient had a PCN reaction that required hospitalization: No Has patient had a PCN reaction occurring  within the last 10 years: No If all of the above answers are "NO", then may proceed with Cephalosporin use.   . Prednisone Hives  . Latex Rash    Some gloves and tape cause red and raw skin. Skin peels off.     Past Medical History:  Diagnosis Date  . Anxiety   . Depression   . History of palpitations 2011   work-up with cardiologist with event monitor, showed no arrhythmias  . History of panic attacks   . Migraine headache   . Morbid obesity (HCC)   . PONV (postoperative nausea and vomiting)     Past Surgical History:  Procedure Laterality Date  . D & C HYSTEROSCOPY W/ ENDOMETRIAL ABLATION  01-29-2005   dr Despina Hidden  @[facility]   . ELBOW SURGERY Left   . ESOPHAGOGASTRODUODENOSCOPY N/A 05/18/2014   SLF: 1. Mild esophaigitis. 2. Mild non-erosive gastritis.   05/20/2014 LAPAROSCOPIC GASTRIC SLEEVE RESECTION N/A 12/22/2017   Procedure: LAPAROSCOPIC GASTRIC SLEEVE RESECTION, UPPER ENDO, ERAS PATHWAY;  Surgeon: 13/06/2017, MD;  Location: [facility];  Service: General;  Laterality: N/A;  . ORIF RADIUS & ULNA FRACTURES Left 11-01-2006   dr 11-03-2006 @[facility]   . ULNAR NERVE TRANSPOSITION Left 2011   AND REMOVAL HARDWARE OF WRIST    Social History   Socioeconomic History  . Marital status: Married    Spouse name: Not on file  . Number of children: Not on file  . Years of education: Not on file  . Highest education level: Not on file  Occupational History  . Not on file  Tobacco  Use  . Smoking status: Never Smoker  . Smokeless tobacco: Never Used  Vaping Use  . Vaping Use: Never used  Substance and Sexual Activity  . Alcohol use: No  . Drug use: No  . Sexual activity: Yes    Birth control/protection: Other-see comments    Comment: vasectomy; ablation  Other Topics Concern  . Not on file  Social History Narrative  . Not on file   Social Determinants of Health   Financial Resource Strain:   . Difficulty of Paying Living Expenses:   Food Insecurity:   . Worried About Programme researcher, broadcasting/film/video in  the Last Year:   . Barista in the Last Year:   Transportation Needs:   . Freight forwarder (Medical):   Marland Kitchen Lack of Transportation (Non-Medical):   Physical Activity:   . Days of Exercise per Week:   . Minutes of Exercise per Session:   Stress:   . Feeling of Stress :   Social Connections:   . Frequency of Communication with Friends and Family:   . Frequency of Social Gatherings with Friends and Family:   . Attends Religious Services:   . Active Member of Clubs or Organizations:   . Attends Banker Meetings:   Marland Kitchen Marital Status:     Family History  Problem Relation Age of Onset  . Hypertension Father   . Hyperlipidemia Father   . Diabetes Father   . Diabetes Paternal Grandmother   . Hyperlipidemia Paternal Grandmother   . Hypertension Paternal Grandmother   . Breast cancer Paternal Aunt   . Colon cancer Neg Hx     Health Maintenance  Topic Date Due  . Hepatitis C Screening  Never done  . COVID-19 Vaccine (1) Never done  . HIV Screening  Never done  . INFLUENZA VACCINE  09/18/2019  . MAMMOGRAM  01/30/2021  . PAP SMEAR-Modifier  01/05/2022  . TETANUS/TDAP  11/25/2028     ----------------------------------------------------------------------------------------------------------------------------------------------------------------------------------------------------------------- Physical Exam BP 118/87 (BP Location: Left Arm, Patient Position: Sitting, Cuff Size: Normal)   Pulse 76   Temp 97.8 F (36.6 C) (Temporal)   Ht 5' 6.5" (1.689 m)   Wt 195 lb 11.2 oz (88.8 kg)   SpO2 100%   BMI 31.11 kg/m   Physical Exam Constitutional:      General: She is not in acute distress. HENT:     Head: Normocephalic and atraumatic.     Right Ear: Tympanic membrane normal.     Left Ear: Tympanic membrane normal.     Nose: Nose normal.  Eyes:     General: No scleral icterus.    Conjunctiva/sclera: Conjunctivae normal.  Neck:     Thyroid: No  thyromegaly.  Cardiovascular:     Rate and Rhythm: Normal rate and regular rhythm.     Heart sounds: Normal heart sounds.  Pulmonary:     Effort: Pulmonary effort is normal.     Breath sounds: Normal breath sounds.  Abdominal:     General: Bowel sounds are normal. There is no distension.     Palpations: Abdomen is soft.     Tenderness: There is no abdominal tenderness. There is no guarding.  Musculoskeletal:        General: Normal range of motion.     Cervical back: Normal range of motion and neck supple.  Lymphadenopathy:     Cervical: No cervical adenopathy.  Skin:    General: Skin is warm and dry.     Findings:  No rash.  Neurological:     Mental Status: She is alert and oriented to person, place, and time.     Cranial Nerves: No cranial nerve deficit.     Coordination: Coordination normal.  Psychiatric:        Behavior: Behavior normal.     ------------------------------------------------------------------------------------------------------------------------------------------------------------------------------------------------------------------- Assessment and Plan  Well adult exam Well adult Orders Placed This Encounter  Procedures  . COMPLETE METABOLIC PANEL WITH GFR  . CBC  . Lipid Profile  . TSH + free T4  Immunizations:  UTD Screenings: Lipid.  PAP UTD Update TSH and FT4 Anticipatory guidance/Risk factor reduction:  Encouraged healthy habits to help with continued weight loss.  Additional  Recommendations per AVS   No orders of the defined types were placed in this encounter.   No follow-ups on file.    This visit occurred during the SARS-CoV-2 public health emergency.  Safety protocols were in place, including screening questions prior to the visit, additional usage of staff PPE, and extensive cleaning of exam room while observing appropriate contact time as indicated for disinfecting solutions.

## 2019-09-06 NOTE — Patient Instructions (Addendum)
Great to meet you today! Please continue to follow up annually.    Preventive Care 46-46 Years Old, Female Preventive care refers to visits with your health care provider and lifestyle choices that can promote health and wellness. This includes:  A yearly physical exam. This may also be called an annual well check.  Regular dental visits and eye exams.  Immunizations.  Screening for certain conditions.  Healthy lifestyle choices, such as eating a healthy diet, getting regular exercise, not using drugs or products that contain nicotine and tobacco, and limiting alcohol use. What can I expect for my preventive care visit? Physical exam Your health care provider will check your:  Height and weight. This may be used to calculate body mass index (BMI), which tells if you are at a healthy weight.  Heart rate and blood pressure.  Skin for abnormal spots. Counseling Your health care provider may ask you questions about your:  Alcohol, tobacco, and drug use.  Emotional well-being.  Home and relationship well-being.  Sexual activity.  Eating habits.  Work and work Statistician.  Method of birth control.  Menstrual cycle.  Pregnancy history. What immunizations do I need?  Influenza (flu) vaccine  This is recommended every year. Tetanus, diphtheria, and pertussis (Tdap) vaccine  You may need a Td booster every 10 years. Varicella (chickenpox) vaccine  You may need this if you have not been vaccinated. Zoster (shingles) vaccine  You may need this after age 46. Measles, mumps, and rubella (MMR) vaccine  You may need at least one dose of MMR if you were born in 1957 or later. You may also need a second dose. Pneumococcal conjugate (PCV13) vaccine  You may need this if you have certain conditions and were not previously vaccinated. Pneumococcal polysaccharide (PPSV23) vaccine  You may need one or two doses if you smoke cigarettes or if you have certain  conditions. Meningococcal conjugate (MenACWY) vaccine  You may need this if you have certain conditions. Hepatitis A vaccine  You may need this if you have certain conditions or if you travel or work in places where you may be exposed to hepatitis A. Hepatitis B vaccine  You may need this if you have certain conditions or if you travel or work in places where you may be exposed to hepatitis B. Haemophilus influenzae type b (Hib) vaccine  You may need this if you have certain conditions. Human papillomavirus (HPV) vaccine  If recommended by your health care provider, you may need three doses over 6 months. You may receive vaccines as individual doses or as more than one vaccine together in one shot (combination vaccines). Talk with your health care provider about the risks and benefits of combination vaccines. What tests do I need? Blood tests  Lipid and cholesterol levels. These may be checked every 5 years, or more frequently if you are over 46 years old.  Hepatitis C test.  Hepatitis B test. Screening  Lung cancer screening. You may have this screening every year starting at age 46 if you have a 30-pack-year history of smoking and currently smoke or have quit within the past 15 years.  Colorectal cancer screening. All adults should have this screening starting at age 46 and continuing until age 62. Your health care provider may recommend screening at age 30 if you are at increased risk. You will have tests every 1-10 years, depending on your results and the type of screening test.  Diabetes screening. This is done by checking your blood sugar (  glucose) after you have not eaten for a while (fasting). You may have this done every 1-3 years.  Mammogram. This may be done every 1-2 years. Talk with your health care provider about when you should start having regular mammograms. This may depend on whether you have a family history of breast cancer.  BRCA-related cancer screening. This  may be done if you have a family history of breast, ovarian, tubal, or peritoneal cancers.  Pelvic exam and Pap test. This may be done every 3 years starting at age 46. Starting at age 25, this may be done every 5 years if you have a Pap test in combination with an HPV test. Other tests  Sexually transmitted disease (STD) testing.  Bone density scan. This is done to screen for osteoporosis. You may have this scan if you are at high risk for osteoporosis. Follow these instructions at home: Eating and drinking  Eat a diet that includes fresh fruits and vegetables, whole grains, lean protein, and low-fat dairy.  Take vitamin and mineral supplements as recommended by your health care provider.  Do not drink alcohol if: ? Your health care provider tells you not to drink. ? You are pregnant, may be pregnant, or are planning to become pregnant.  If you drink alcohol: ? Limit how much you have to 0-1 drink a day. ? Be aware of how much alcohol is in your drink. In the [place] one drink equals one 12 oz bottle of beer (355 mL), one 5 oz glass of wine (148 mL), or one 1 oz glass of hard liquor (44 mL). Lifestyle  Take daily care of your teeth and gums.  Stay active. Exercise for at least 30 minutes on 5 or more days each week.  Do not use any products that contain nicotine or tobacco, such as cigarettes, e-cigarettes, and chewing tobacco. If you need help quitting, ask your health care provider.  If you are sexually active, practice safe sex. Use a condom or other form of birth control (contraception) in order to prevent pregnancy and STIs (sexually transmitted infections).  If told by your health care provider, take low-dose aspirin daily starting at age 46. What's next?  Visit your health care provider once a year for a well check visit.  Ask your health care provider how often you should have your eyes and teeth checked.  Stay up to date on all vaccines. This information is not  intended to replace advice given to you by your health care provider. Make sure you discuss any questions you have with your health care provider. Document Revised: 10/15/2017 Document Reviewed: 10/15/2017 Elsevier Patient Education  2020 [employer].

## 2019-09-06 NOTE — Assessment & Plan Note (Signed)
Well adult Orders Placed This Encounter  Procedures  . COMPLETE METABOLIC PANEL WITH GFR  . CBC  . Lipid Profile  . TSH + free T4  Immunizations:  UTD Screenings: Lipid.  PAP UTD Update TSH and FT4 Anticipatory guidance/Risk factor reduction:  Encouraged healthy habits to help with continued weight loss.  Additional  Recommendations per AVS

## 2019-09-07 LAB — COMPLETE METABOLIC PANEL WITH GFR
AG Ratio: 1.9 (calc) (ref 1.0–2.5)
ALT: 18 U/L (ref 6–29)
AST: 20 U/L (ref 10–35)
Albumin: 4.1 g/dL (ref 3.6–5.1)
Alkaline phosphatase (APISO): 44 U/L (ref 31–125)
BUN: 14 mg/dL (ref 7–25)
CO2: 29 mmol/L (ref 20–32)
Calcium: 9.4 mg/dL (ref 8.6–10.2)
Chloride: 105 mmol/L (ref 98–110)
Creat: 0.85 mg/dL (ref 0.50–1.10)
GFR, Est African American: 96 mL/min/{1.73_m2} (ref >=60)
GFR, Est Non African American: 83 mL/min/{1.73_m2} (ref >=60)
Globulin: 2.2 g/dL (calc) (ref 1.9–3.7)
Glucose, Bld: 84 mg/dL (ref 65–99)
Potassium: 4.7 mmol/L (ref 3.5–5.3)
Sodium: 141 mmol/L (ref 135–146)
Total Bilirubin: 0.7 mg/dL (ref 0.2–1.2)
Total Protein: 6.3 g/dL (ref 6.1–8.1)

## 2019-09-07 LAB — CBC
HCT: 45.1 % — ABNORMAL HIGH (ref 35.0–45.0)
Hemoglobin: 15.8 g/dL — ABNORMAL HIGH (ref 11.7–15.5)
MCH: 32.4 pg (ref 27.0–33.0)
MCHC: 35 g/dL (ref 32.0–36.0)
MCV: 92.4 fL (ref 80.0–100.0)
MPV: 10 fL (ref 7.5–12.5)
Platelets: 363 10*3/uL (ref 140–400)
RBC: 4.88 10*6/uL (ref 3.80–5.10)
RDW: 12.1 % (ref 11.0–15.0)
WBC: 5.8 10*3/uL (ref 3.8–10.8)

## 2019-09-07 LAB — LIPID PANEL
Cholesterol: 171 mg/dL (ref <200)
HDL: 58 mg/dL (ref >=50)
LDL Cholesterol (Calc): 95 mg/dL (calc)
Non-HDL Cholesterol (Calc): 113 mg/dL (calc) (ref <130)
Total CHOL/HDL Ratio: 2.9 (calc) (ref <5.0)
Triglycerides: 90 mg/dL (ref <150)

## 2019-09-07 LAB — TSH+FREE T4: TSH W/REFLEX TO FT4: 2.36 mIU/L

## 2019-09-08 ENCOUNTER — Encounter: Payer: Self-pay | Admitting: Family Medicine

## 2019-10-20 ENCOUNTER — Encounter: Payer: Self-pay | Admitting: Family Medicine

## 2019-10-20 NOTE — Telephone Encounter (Signed)
Recommend visit

## 2019-10-21 ENCOUNTER — Telehealth (INDEPENDENT_AMBULATORY_CARE_PROVIDER_SITE_OTHER): Payer: 59 | Admitting: Family Medicine

## 2019-10-21 ENCOUNTER — Encounter: Payer: Self-pay | Admitting: Family Medicine

## 2019-10-21 DIAGNOSIS — F411 Generalized anxiety disorder: Secondary | ICD-10-CM | POA: Diagnosis not present

## 2019-10-21 MED ORDER — ALPRAZOLAM 0.5 MG PO TABS
0.2500 mg | ORAL_TABLET | Freq: Two times a day (BID) | ORAL | 1 refills | Status: AC | PRN
Start: 2019-10-21 — End: 2019-11-20

## 2019-10-21 MED ORDER — ESCITALOPRAM OXALATE 10 MG PO TABS: 10.0000 mg | ORAL_TABLET | Freq: Every day | ORAL | 1 refills | Status: DC

## 2019-10-21 NOTE — Progress Notes (Signed)
Feels she's super anxious. Her daughter is 46 years old and was supposed to have a scan for possible ovarian cancer. Found out she's pregnant. 63 year old nephew showing same symptoms of cancer that niece passed away from.   Anything irritates her.   She thinks it may be hormonal because she doesn't have a menstrual cycle due to an ablation.  Feels pressured from work.

## 2019-10-23 NOTE — Assessment & Plan Note (Signed)
Discussed treatment options for management of her anxiety.   Reviewed relaxation techniques.  Will start lexapro with alprazolam 0.25-0.5mg  as needed until lexapro is working well for her.   Encourage counseling as well. Follow up in 3-4 weeks.

## 2019-10-23 NOTE — Progress Notes (Signed)
Barbara [name] - 44 y.o. female MRN [id number]  Date of birth: 04-Dec-1975   This visit type was conducted due to national recommendations for restrictions regarding the COVID-19 Pandemic (e.g. social distancing).  This format is felt to be most appropriate for this patient at this time.  All issues noted in this document were discussed and addressed.  No physical exam was performed (except for noted visual exam findings with Video Visits).  I discussed the limitations of evaluation and management by telemedicine and the availability of in person appointments. The patient expressed understanding and agreed to proceed.  I connected with@ on 10/23/19 at 11:30 AM EDT by a video enabled telemedicine application and verified that I am speaking with the correct person using two identifiers.  Present at visit: Everrett Coombe, DO [name]   Patient Location: [facility] [address] [place] [address]   Provider location:   [facility]  Chief Complaint  Patient presents with  . Anxiety    HPI  Barbara [name] is a 44 y.o. female who presents via audio/video conferencing for a telehealth visit today.  She has complaint today of increased anxiety.  She reports that she has had increased worry, irritability, difficulty sleeping.  She reports that her daughter is currently pregnant but there is concern that she may have ovarian cancer.  She is having scan completed soon for further evaluation.  She also reports that her nephew is having symptoms of the same cancer that her niece passed away from.  There is also increased stress related to work and she thinks she may be perimenopausal.  She has taken alprazolam briefly int he past for anxiety and this has worked well for her.  She did also take citalopram which was helpful previously as well.  She has never seen a counselor but is considering EAP program through her employer.    ROS:  A comprehensive ROS was completed and negative except as noted per  HPI    Depression screen [facility] 2/9 10/21/2019 09/06/2019 11/26/2018  Decreased Interest 0 0 0  Down, Depressed, Hopeless 1 0 0  PHQ - 2 Score 1 0 0  Altered sleeping 0 1 -  Tired, decreased energy 0 1 -  Change in appetite 1 0 -  Feeling bad or failure about yourself  2 0 -  Trouble concentrating 1 0 -  Moving slowly or fidgety/restless 0 0 -  Suicidal thoughts 0 0 -  PHQ-9 Score 5 2 -  Difficult doing work/chores - Not difficult at all -   GAD 7 : Generalized Anxiety Score 10/21/2019 09/06/2019  Nervous, Anxious, on Edge 2 0  Control/stop worrying 3 0  Worry too much - different things 3 0  Trouble relaxing 2 0  Restless 1 0  Easily annoyed or irritable 3 0  Afraid - awful might happen 2 0  Total GAD 7 Score 16 0  Anxiety Difficulty Somewhat difficult -       ROS:  A comprehensive ROS was completed and negative except as noted per HPI  Past Medical History:  Diagnosis Date  . Anxiety   . Depression   . History of palpitations 2011   work-up with cardiologist with event monitor, showed no arrhythmias  . History of panic attacks   . Migraine headache   . Morbid obesity (HCC)   . PONV (postoperative nausea and vomiting)     Past Surgical History:  Procedure Laterality Date  . D & C HYSTEROSCOPY  W/ ENDOMETRIAL ABLATION  01-29-2005   dr Despina Hidden  @[facility]   . ELBOW SURGERY Left   . ESOPHAGOGASTRODUODENOSCOPY N/A 05/18/2014   SLF: 1. Mild esophaigitis. 2. Mild non-erosive gastritis.   05/20/2014 LAPAROSCOPIC GASTRIC SLEEVE RESECTION N/A 12/22/2017   Procedure: LAPAROSCOPIC GASTRIC SLEEVE RESECTION, UPPER ENDO, ERAS PATHWAY;  Surgeon: 13/06/2017, MD;  Location: [facility];  Service: General;  Laterality: N/A;  . ORIF RADIUS & ULNA FRACTURES Left 11-01-2006   dr 11-03-2006 @[facility]   . ULNAR NERVE TRANSPOSITION Left 2011   AND REMOVAL HARDWARE OF WRIST    Family History  Problem Relation Age of Onset  . Hypertension Father   . Hyperlipidemia Father   . Diabetes Father   . Diabetes Paternal  Grandmother   . Hyperlipidemia Paternal Grandmother   . Hypertension Paternal Grandmother   . Breast cancer Paternal Aunt   . Colon cancer Neg Hx     Social History   Socioeconomic History  . Marital status: Married    Spouse name: Not on file  . Number of children: Not on file  . Years of education: Not on file  . Highest education level: Not on file  Occupational History  . Not on file  Tobacco Use  . Smoking status: Never Smoker  . Smokeless tobacco: Never Used  Vaping Use  . Vaping Use: Never used  Substance and Sexual Activity  . Alcohol use: No  . Drug use: No  . Sexual activity: Yes    Birth control/protection: Other-see comments    Comment: vasectomy; ablation  Other Topics Concern  . Not on file  Social History Narrative  . Not on file   Social Determinants of Health   Financial Resource Strain:   . Difficulty of Paying Living Expenses: Not on file  Food Insecurity:   . Worried About in the Last Year: Not on file  . Ran Out of Food in the Last Year: Not on file  Transportation Needs:   . Lack of Transportation (Medical): Not on file  . Lack of Transportation (Non-Medical): Not on file  Physical Activity:   . Days of Exercise per Week: Not on file  . Minutes of Exercise per Session: Not on file  Stress:   . Feeling of Stress : Not on file  Social Connections:   . Frequency of Communication with Friends and Family: Not on file  . Frequency of Social Gatherings with Friends and Family: Not on file  . Attends Religious Services: Not on file  . Active Member of Clubs or Organizations: Not on file  . Attends 2012 Meetings: Not on file  . Marital Status: Not on file  Intimate Partner Violence:   . Fear of Current or Ex-Partner: Not on file  . Emotionally Abused: Not on file  . Physically Abused: Not on file  . Sexually Abused: Not on file     Current Outpatient Medications:  .  Calcium-Phosphorus-Vitamin D  (CALCIUM/D3 ADULT GUMMIES PO), Take by mouth 3 (three) times daily., Disp: , Rfl:  .  cetirizine (ZYRTEC) 10 MG tablet, Take 10 mg by mouth daily., Disp: , Rfl:  .  Multiple Vitamins-Minerals (BARIATRIC MULTIVITAMINS/IRON PO), Take 1 tablet by mouth daily. , Disp: , Rfl:  .  ALPRAZolam (XANAX) 0.5 MG tablet, Take 0.5-1 tablets (0.25-0.5 mg total) by mouth 2 (two) times daily as needed for anxiety., Disp: 30 tablet, Rfl: 1 .  escitalopram (LEXAPRO) 10 MG tablet, Take 1 tablet (10 mg  total) by mouth daily. Start 5mg  daily x1 week then increase to 10mg  daily, Disp: 30 tablet, Rfl: 1  EXAM:  VITALS per patient if applicable: Ht 5' 6.5" (1.689 m)   Wt 195 lb (88.5 kg)   BMI 31.00 kg/m   GENERAL: alert, oriented, appears well and in no acute distress  HEENT: atraumatic, conjunttiva clear, no obvious abnormalities on inspection of external nose and ears  NECK: normal movements of the head and neck  LUNGS: on inspection no signs of respiratory distress, breathing rate appears normal, no obvious gross SOB, gasping or wheezing  CV: no obvious cyanosis  MS: moves all visible extremities without noticeable abnormality  PSYCH/NEURO: pleasant and cooperative, anxious and tearful at times, speech and thought processing grossly intact  ASSESSMENT AND PLAN:  Discussed the following assessment and plan:  GAD (generalized anxiety disorder) Discussed treatment options for management of her anxiety.   Reviewed relaxation techniques.  Will start lexapro with alprazolam 0.25-0.5mg  as needed until lexapro is working well for her.   Encourage counseling as well. Follow up in 3-4 weeks.        I discussed the assessment and treatment plan with the patient. The patient was provided an opportunity to ask questions and all were answered. The patient agreed with the plan and demonstrated an understanding of the instructions.   The patient was advised to call back or seek an in-person evaluation if the  symptoms worsen or if the condition fails to improve as anticipated.    , DO

## 2020-01-03 ENCOUNTER — Other Ambulatory Visit: Payer: Self-pay | Admitting: Family Medicine

## 2020-01-05 ENCOUNTER — Telehealth (INDEPENDENT_AMBULATORY_CARE_PROVIDER_SITE_OTHER): Payer: 59 | Admitting: Family Medicine

## 2020-01-05 ENCOUNTER — Encounter: Payer: Self-pay | Admitting: Family Medicine

## 2020-01-05 DIAGNOSIS — F411 Generalized anxiety disorder: Secondary | ICD-10-CM

## 2020-01-05 MED ORDER — ESCITALOPRAM OXALATE 10 MG PO TABS
10.0000 mg | ORAL_TABLET | Freq: Every day | ORAL | 1 refills | Status: DC
Start: 2020-01-05 — End: 2021-06-24

## 2020-01-05 NOTE — Assessment & Plan Note (Signed)
She is doing remarkably well with lexapro.  Continue at current strength with plan for follow up in 3 months.   Instructed to contact clinic sooner if needed.

## 2020-01-05 NOTE — Progress Notes (Signed)
Barbara [name] - 44 y.o. female MRN [id number]  Date of birth: [birth year]   This visit type was conducted due to national recommendations for restrictions regarding the COVID-19 Pandemic (e.g. social distancing).  This format is felt to be most appropriate for this patient at this time.  All issues noted in this document were discussed and addressed.  No physical exam was performed (except for noted visual exam findings with Video Visits).  I discussed the limitations of evaluation and management by telemedicine and the availability of in person appointments. The patient expressed understanding and agreed to proceed.  I connected with@ on 01/05/20 at  3:40 PM EST by a video enabled telemedicine application and verified that I am speaking with the correct person using two identifiers.  Present at visit: Everrett Coombe, DO [name]   Patient Location: Home [address] [place] [address]   Provider location:   [facility]  Chief Complaint  Patient presents with  . Follow-up    HPI  Barbara [name] is a 44 y.o. female who presents via audio/video conferencing for a telehealth visit today.  She is following up today for anxiety.  She was started on lexapro at her last visit in September.  She reports that since starting this she has done very well.  She missed a couple of days and one of her co-workers noted that she was much more irritable.  She has not had any side effects from medication at current dose.   Depression screen [facility] 2/9 01/05/2020 10/21/2019 09/06/2019  Decreased Interest 0 0 0  Down, Depressed, Hopeless 0 1 0  PHQ - 2 Score 0 1 0  Altered sleeping 0 0 1  Tired, decreased energy 0 0 1  Change in appetite 0 1 0  Feeling bad or failure about yourself  0 2 0  Trouble concentrating 0 1 0  Moving slowly or fidgety/restless 0 0 0  Suicidal thoughts 0 0 0  PHQ-9 Score 0 5 2  Difficult doing work/chores - - Not difficult at all   GAD 7 : Generalized Anxiety Score 01/05/2020  10/21/2019 09/06/2019  Nervous, Anxious, on Edge 0 2 0  Control/stop worrying 0 3 0  Worry too much - different things 0 3 0  Trouble relaxing 0 2 0  Restless 0 1 0  Easily annoyed or irritable 0 3 0  Afraid - awful might happen 0 2 0  Total GAD 7 Score 0 16 0  Anxiety Difficulty - Somewhat difficult -       ROS:  A comprehensive ROS was completed and negative except as noted per HPI  Past Medical History:  Diagnosis Date  . Anxiety   . Depression   . History of palpitations 2011   work-up with cardiologist with event monitor, showed no arrhythmias  . History of panic attacks   . Migraine headache   . Morbid obesity (HCC)   . PONV (postoperative nausea and vomiting)     Past Surgical History:  Procedure Laterality Date  . D & C HYSTEROSCOPY W/ ENDOMETRIAL ABLATION  01-29-2005   dr Despina Hidden  @[facility]   . ELBOW SURGERY Left   . ESOPHAGOGASTRODUODENOSCOPY N/A 05/18/2014   SLF: 1. Mild esophaigitis. 2. Mild non-erosive gastritis.   05/20/2014 LAPAROSCOPIC GASTRIC SLEEVE RESECTION N/A 12/22/2017   Procedure: LAPAROSCOPIC GASTRIC SLEEVE RESECTION, UPPER ENDO, ERAS PATHWAY;  Surgeon: 13/06/2017, MD;  Location: [facility];  Service: General;  Laterality: N/A;  . ORIF RADIUS &  ULNA FRACTURES Left 11-01-2006   dr Melvyn Novas @[facility]   . ULNAR NERVE TRANSPOSITION Left 2011   AND REMOVAL HARDWARE OF WRIST    Family History  Problem Relation Age of Onset  . Hypertension Father   . Hyperlipidemia Father   . Diabetes Father   . Diabetes Paternal Grandmother   . Hyperlipidemia Paternal Grandmother   . Hypertension Paternal Grandmother   . Breast cancer Paternal Aunt   . Colon cancer Neg Hx     Social History   Socioeconomic History  . Marital status: Married    Spouse name: Not on file  . Number of children: Not on file  . Years of education: Not on file  . Highest education level: Not on file  Occupational History  . Not on file  Tobacco Use  . Smoking status: Never Smoker  . Smokeless  tobacco: Never Used  Vaping Use  . Vaping Use: Never used  Substance and Sexual Activity  . Alcohol use: No  . Drug use: No  . Sexual activity: Yes    Birth control/protection: Other-see comments    Comment: vasectomy; ablation  Other Topics Concern  . Not on file  Social History Narrative  . Not on file   Social Determinants of Health   Financial Resource Strain:   . Difficulty of Paying Living Expenses: Not on file  Food Insecurity:   . Worried About 2012 in the Last Year: Not on file  . Ran Out of Food in the Last Year: Not on file  Transportation Needs:   . Lack of Transportation (Medical): Not on file  . Lack of Transportation (Non-Medical): Not on file  Physical Activity:   . Days of Exercise per Week: Not on file  . Minutes of Exercise per Session: Not on file  Stress:   . Feeling of Stress : Not on file  Social Connections:   . Frequency of Communication with Friends and Family: Not on file  . Frequency of Social Gatherings with Friends and Family: Not on file  . Attends Religious Services: Not on file  . Active Member of Clubs or Organizations: Not on file  . Attends Programme researcher, broadcasting/film/video Meetings: Not on file  . Marital Status: Not on file  Intimate Partner Violence:   . Fear of Current or Ex-Partner: Not on file  . Emotionally Abused: Not on file  . Physically Abused: Not on file  . Sexually Abused: Not on file     Current Outpatient Medications:  .  Calcium-Phosphorus-Vitamin D (CALCIUM/D3 ADULT GUMMIES PO), Take by mouth 3 (three) times daily., Disp: , Rfl:  .  cetirizine (ZYRTEC) 10 MG tablet, Take 10 mg by mouth daily., Disp: , Rfl:  .  escitalopram (LEXAPRO) 10 MG tablet, Take 1 tablet (10 mg total) by mouth daily., Disp: 90 tablet, Rfl: 1 .  Multiple Vitamins-Minerals (BARIATRIC MULTIVITAMINS/IRON PO), Take 1 tablet by mouth daily. , Disp: , Rfl:   EXAM:  VITALS per patient if applicable: Wt 192 lb (87.1 kg)   BMI 30.53 kg/m    GENERAL: alert, oriented, appears well and in no acute distress  HEENT: atraumatic, conjunttiva clear, no obvious abnormalities on inspection of external nose and ears  NECK: normal movements of the head and neck  LUNGS: on inspection no signs of respiratory distress, breathing rate appears normal, no obvious gross SOB, gasping or wheezing  CV: no obvious cyanosis  MS: moves all visible extremities without noticeable abnormality  PSYCH/NEURO: pleasant and  cooperative, no obvious depression or anxiety, speech and thought processing grossly intact  ASSESSMENT AND PLAN:  Discussed the following assessment and plan:  GAD (generalized anxiety disorder) She is doing remarkably well with lexapro.  Continue at current strength with plan for follow up in 3 months.   Instructed to contact clinic sooner if needed.      I discussed the assessment and treatment plan with the patient. The patient was provided an opportunity to ask questions and all were answered. The patient agreed with the plan and demonstrated an understanding of the instructions.   The patient was advised to call back or seek an in-person evaluation if the symptoms worsen or if the condition fails to improve as anticipated.    Everrett Coombe, DO

## 2020-02-21 DIAGNOSIS — L659 Nonscarring hair loss, unspecified: Secondary | ICD-10-CM | POA: Diagnosis not present

## 2020-02-21 DIAGNOSIS — R69 Illness, unspecified: Secondary | ICD-10-CM | POA: Diagnosis not present

## 2020-02-21 DIAGNOSIS — R5383 Other fatigue: Secondary | ICD-10-CM | POA: Diagnosis not present

## 2020-02-21 DIAGNOSIS — Z9884 Bariatric surgery status: Secondary | ICD-10-CM | POA: Diagnosis not present

## 2020-02-21 DIAGNOSIS — K912 Postsurgical malabsorption, not elsewhere classified: Secondary | ICD-10-CM | POA: Diagnosis not present

## 2020-05-21 ENCOUNTER — Encounter: Payer: Self-pay | Admitting: Family Medicine

## 2020-09-03 ENCOUNTER — Encounter: Payer: Self-pay | Admitting: Family Medicine

## 2020-09-10 ENCOUNTER — Other Ambulatory Visit: Payer: Self-pay

## 2020-09-10 ENCOUNTER — Ambulatory Visit (INDEPENDENT_AMBULATORY_CARE_PROVIDER_SITE_OTHER): Payer: BC Managed Care – PPO | Admitting: Physician Assistant

## 2020-09-10 ENCOUNTER — Encounter: Payer: Self-pay | Admitting: Physician Assistant

## 2020-09-10 VITALS — BP 118/86 | HR 89 | Temp 99.1°F | Ht 66.5 in | Wt 208.0 lb

## 2020-09-10 DIAGNOSIS — R509 Fever, unspecified: Secondary | ICD-10-CM

## 2020-09-10 DIAGNOSIS — R5383 Other fatigue: Secondary | ICD-10-CM | POA: Diagnosis not present

## 2020-09-10 DIAGNOSIS — R6883 Chills (without fever): Secondary | ICD-10-CM | POA: Diagnosis not present

## 2020-09-10 DIAGNOSIS — J029 Acute pharyngitis, unspecified: Secondary | ICD-10-CM | POA: Diagnosis not present

## 2020-09-10 DIAGNOSIS — F439 Reaction to severe stress, unspecified: Secondary | ICD-10-CM

## 2020-09-10 LAB — POCT INFLUENZA A/B
Influenza A, POC: NEGATIVE
Influenza B, POC: NEGATIVE

## 2020-09-10 LAB — POCT RAPID STREP A (OFFICE): Rapid Strep A Screen: NEGATIVE

## 2020-09-10 NOTE — Progress Notes (Signed)
Subjective:    Patient ID: Barbara [name], female    DOB: [birth year], 45 y.o.   MRN: [id number]  HPI Pt is a 45 yo female who presents to the clinic with day 3 of fever, chills, body aches, fatigued, sore throat and "just feeling terrible". She has been taking tylenol with minimal relief. She has a "little cough" but no SOB or production. No sinus drainage. Her right ear does "feel tight". 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 Denies any other medication changes. Temperature has been right around 99. She took home covid test which was negative. She has not had vaccination. She feels "overwhelmed and stress" for the last week or so. Her autistic son has really been struggling and it is hard for her to watch and protect all the time. She feels like the stress and run her immune system low.  .. Active Ambulatory Problems    Diagnosis Date Noted   GAD (generalized anxiety disorder) 10/26/2009   Migraine headache 10/26/2009   PALPITATIONS 10/26/2009   Unspecified symptom associated with female genital organs 12/22/2012   Dyspepsia    Screening for colorectal cancer 03/25/2016   Encounter for gynecological examination with Papanicolaou smear of cervix 03/25/2016   Hypothyroid 09/02/2016   Grief 11/25/2016   Bariatric surgery status 11/26/2018   Well adult exam 09/06/2019   Resolved Ambulatory Problems    Diagnosis Date Noted   Morbid obesity ([place]) 10/29/2009   Abdominal pain, epigastric 05/04/2014   [facility] spotted fever) 05/31/2018   Past Medical History:  Diagnosis Date   Anxiety    Depression    History of palpitations 2011   History of panic attacks    PONV (postoperative nausea and vomiting)      Review of Systems See HPI.     Objective:   Physical Exam Vitals reviewed.  Constitutional:      General: She is not in acute distress.    Appearance: She is well-developed. She is obese.   HENT:     Head: Normocephalic.     Right Ear: No swelling or tenderness. No middle ear effusion. Tympanic membrane is erythematous.     Left Ear: Tympanic membrane normal.     Nose: Congestion present.     Mouth/Throat:     Pharynx: Uvula midline. Posterior oropharyngeal erythema present. No oropharyngeal exudate or uvula swelling.     Tonsils: No tonsillar exudate. 0 on the right. 0 on the left.  Eyes:     Conjunctiva/sclera: Conjunctivae normal.     Pupils: Pupils are equal, round, and reactive to light.  Cardiovascular:     Rate and Rhythm: Normal rate and regular rhythm.     Heart sounds: Normal heart sounds.  Pulmonary:     Effort: Pulmonary effort is normal.     Breath sounds: Normal breath sounds.  Abdominal:     Palpations: Abdomen is soft.  Musculoskeletal:     Cervical back: Normal range of motion.  Neurological:     General: No focal deficit present.     Mental Status: She is alert.  Psychiatric:        Mood and Affect: Mood normal.      .. Results for orders placed or performed in visit on 09/10/20  CBC with Differential/Platelet  Result Value Ref Range   WBC 5.9 3.8 - 10.8 Thousand/uL   RBC 5.32 (H) 3.80 - 5.10  Million/uL   Hemoglobin 16.3 (H) 11.7 - 15.5 g/dL   HCT 49.5 (H) 35.0 - 45.0 %   MCV 93.0 80.0 - 100.0 fL   MCH 30.6 27.0 - 33.0 pg   MCHC 32.9 32.0 - 36.0 g/dL   RDW 12.0 11.0 - 15.0 %   Platelets 322 140 - 400 Thousand/uL   MPV 9.9 7.5 - 12.5 fL   Neutro Abs 3,192 1,500 - 7,800 cells/uL   Lymphs Abs 1,847 850 - 3,900 cells/uL   Absolute Monocytes 755 200 - 950 cells/uL   Eosinophils Absolute 53 15 - 500 cells/uL   Basophils Absolute 53 0 - 200 cells/uL   Neutrophils Relative % 54.1 %   Total Lymphocyte 31.3 %   Monocytes Relative 12.8 %   Eosinophils Relative 0.9 %   Basophils Relative 0.9 %  COMPLETE METABOLIC PANEL WITH GFR  Result Value Ref Range   Glucose, Bld 79 65 - 99 mg/dL   BUN 12 7 - 25 mg/dL   Creat 0.76 0.50 - 0.99 mg/dL    eGFR 98 > OR = 60 mL/min/1.23m   BUN/Creatinine Ratio NOT APPLICABLE 6 - 22 (calc)   Sodium 140 135 - 146 mmol/L   Potassium 4.3 3.5 - 5.3 mmol/L   Chloride 102 98 - 110 mmol/L   CO2 29 20 - 32 mmol/L   Calcium 9.6 8.6 - 10.2 mg/dL   Total Protein 6.8 6.1 - 8.1 g/dL   Albumin 4.2 3.6 - 5.1 g/dL   Globulin 2.6 1.9 - 3.7 g/dL (calc)   AG Ratio 1.6 1.0 - 2.5 (calc)   Total Bilirubin 0.3 0.2 - 1.2 mg/dL   Alkaline phosphatase (APISO) 54 31 - 125 U/L   AST 23 10 - 35 U/L   ALT 15 6 - 29 U/L  POCT rapid strep A  Result Value Ref Range   Rapid Strep A Screen Negative Negative  POCT Influenza A/B  Result Value Ref Range   Influenza A, POC Negative Negative   Influenza B, POC Negative Negative       Assessment & Plan:  .Marland KitchenMarland KitchenSenawas seen today for sore throat and chills.  Diagnoses and all orders for this visit:  Sore throat -     POCT rapid strep A -     POCT Influenza A/B -     Novel Coronavirus, NAA ([facility]) -     Epstein-Barr virus VCA antibody panel -     CBC with Differential/Platelet -     COMPLETE METABOLIC PANEL WITH GFR  Fever, unspecified fever cause -     POCT rapid strep A -     POCT Influenza A/B -     Novel Coronavirus, NAA ([facility]) -     Epstein-Barr virus VCA antibody panel -     CBC with Differential/Platelet -     COMPLETE METABOLIC PANEL WITH GFR  Chills -     POCT rapid strep A -     POCT Influenza A/B -     Novel Coronavirus, NAA ([facility]) -     Epstein-Barr virus VCA antibody panel -     CBC with Differential/Platelet -     COMPLETE METABOLIC PANEL WITH GFR  Fatigue, unspecified type -     POCT rapid strep A -     POCT Influenza A/B -     Novel Coronavirus, NAA ([facility]) -     Epstein-Barr virus VCA antibody panel -     CBC with Differential/Platelet -  COMPLETE METABOLIC PANEL WITH GFR  Unclear etiology of symptoms today.  Strep and flu negative.  Home covid negative, PCR pending.  Mono testing ordered with CBC and CMP.  Likely  viral etiology on day 3 of symptoms.  Discussed symptomatic care with rest, hydration, tylenol.  Ok to start .Marland KitchenVitamin D3 5000 IU (125 mcg) daily Vitamin C 500 mg twice daily Zinc 50 to 75 mg daily For immune support.  Written out of work for 48 hours while covid test pending.  Follow up with any new or worsening symptoms.  Follow up to discuss stress if she thinks she needs more intervention. Certainly counseling could offer some help.

## 2020-09-10 NOTE — Patient Instructions (Signed)
Pharyngitis  Pharyngitis is a sore throat (pharynx). This is when there is redness, pain, and swelling in your throat. Most of the time, this condition gets better on its own. In some cases, you may needmedicine. Follow these instructions at home: Take over-the-counter and prescription medicines only as told by your doctor. If you were prescribed an antibiotic medicine, take it as told by your doctor. Do not stop taking the antibiotic even if you start to feel better. Do not give children aspirin. Aspirin has been linked to Reye syndrome. Drink enough water and fluids to keep your pee (urine) clear or pale yellow. Get a lot of rest. Rinse your mouth (gargle) with a salt-water mixture 3-4 times a day or as needed. To make a salt-water mixture, completely dissolve -1 tsp of salt in 1 cup of warm water. Do not swallow this mixture. If your doctor approves, you may use throat lozenges or sprays to soothe your throat. Contact a doctor if: You have large, tender lumps in your neck. You have a rash. You cough up green, yellow-brown, or bloody spit. Get help right away if: You have a stiff neck. You drool or cannot swallow liquids. You cannot drink or take medicines without throwing up. You have very bad pain that does not go away with medicine. You have problems breathing, and it is not from a stuffy nose. You have new pain and swelling in your knees, ankles, wrists, or elbows. Summary Pharyngitis is a sore throat (pharynx). This is when there is redness, pain, and swelling in your throat. If you were prescribed an antibiotic medicine, take it as told by your doctor. Do not stop taking the antibiotic even if you start to feel better. Most of the time, pharyngitis gets better on its own. Sometimes, you may need medicine. This information is not intended to replace advice given to you by your health care provider. Make sure you discuss any questions you have with your healthcare  provider. Document Revised: 01/05/2020 Document Reviewed: 01/06/2020 Elsevier Patient Education  2022 Elsevier Inc.  

## 2020-09-11 ENCOUNTER — Encounter: Payer: Self-pay | Admitting: Physician Assistant

## 2020-09-11 LAB — CBC WITH DIFFERENTIAL/PLATELET
Absolute Monocytes: 755 cells/uL (ref 200–950)
Basophils Absolute: 53 cells/uL (ref 0–200)
Basophils Relative: 0.9 %
Eosinophils Absolute: 53 cells/uL (ref 15–500)
Eosinophils Relative: 0.9 %
HCT: 49.5 % — ABNORMAL HIGH (ref 35.0–45.0)
Hemoglobin: 16.3 g/dL — ABNORMAL HIGH (ref 11.7–15.5)
Lymphs Abs: 1847 cells/uL (ref 850–3900)
MCH: 30.6 pg (ref 27.0–33.0)
MCHC: 32.9 g/dL (ref 32.0–36.0)
MCV: 93 fL (ref 80.0–100.0)
MPV: 9.9 fL (ref 7.5–12.5)
Monocytes Relative: 12.8 %
Neutro Abs: 3192 cells/uL (ref 1500–7800)
Neutrophils Relative %: 54.1 %
Platelets: 322 10*3/uL (ref 140–400)
RBC: 5.32 10*6/uL — ABNORMAL HIGH (ref 3.80–5.10)
RDW: 12 % (ref 11.0–15.0)
Total Lymphocyte: 31.3 %
WBC: 5.9 10*3/uL (ref 3.8–10.8)

## 2020-09-11 LAB — COMPLETE METABOLIC PANEL WITH GFR
AG Ratio: 1.6 (calc) (ref 1.0–2.5)
ALT: 15 U/L (ref 6–29)
AST: 23 U/L (ref 10–35)
Albumin: 4.2 g/dL (ref 3.6–5.1)
Alkaline phosphatase (APISO): 54 U/L (ref 31–125)
BUN: 12 mg/dL (ref 7–25)
CO2: 29 mmol/L (ref 20–32)
Calcium: 9.6 mg/dL (ref 8.6–10.2)
Chloride: 102 mmol/L (ref 98–110)
Creat: 0.76 mg/dL (ref 0.50–0.99)
Globulin: 2.6 g/dL (calc) (ref 1.9–3.7)
Glucose, Bld: 79 mg/dL (ref 65–99)
Potassium: 4.3 mmol/L (ref 3.5–5.3)
Sodium: 140 mmol/L (ref 135–146)
Total Bilirubin: 0.3 mg/dL (ref 0.2–1.2)
Total Protein: 6.8 g/dL (ref 6.1–8.1)
eGFR: 98 mL/min/{1.73_m2} (ref >=60)

## 2020-09-11 LAB — EPSTEIN-BARR VIRUS VCA ANTIBODY PANEL
EBV NA IgG: 124 U/mL — ABNORMAL HIGH
EBV VCA IgG: 56.7 U/mL — ABNORMAL HIGH
EBV VCA IgM: <36 U/mL

## 2020-09-11 NOTE — Progress Notes (Signed)
Latonja,   Kidney, liver, glucose looks great.  WBC looks good.   No major concerns.

## 2020-09-11 NOTE — Progress Notes (Signed)
No recent exposure to mono. How are you feeling today?

## 2020-09-12 ENCOUNTER — Encounter: Payer: Self-pay | Admitting: Physician Assistant

## 2020-09-12 LAB — SARS-COV-2, NAA 2 DAY TAT

## 2020-09-12 LAB — NOVEL CORONAVIRUS, NAA: SARS-CoV-2, NAA: DETECTED — AB

## 2020-09-12 NOTE — Progress Notes (Signed)
You are positive for covid. Explains why you feel so bad. You within 5 days of symptom onset and we could consider antiviral for you. It does not treat covid but can decrease symptoms as to not to lead to hospitalization. You should quarantine 5 days from symptom onset and 10 days if still not feeling well at 5 days. Go to UC with any worsening of breathing. Consider covid vitamins .Vitamin D3 5000 IU (125 mcg) daily Vitamin C 500 mg twice daily Zinc 50 to 75 mg daily   Person Under Monitoring Name: Barbara [name]  Location: [address] [place] [address]   Infection Prevention Recommendations for Individuals Confirmed to have, or Being Evaluated for, 2019 Novel Coronavirus (COVID-19) Infection Who Receive Care at Home  Individuals who are confirmed to have, or are being evaluated for, COVID-19 should follow the prevention steps below until a healthcare provider or local or state health department says they can return to normal activities.  Stay home except to get medical care You should restrict activities outside your home, except for getting medical care. Do not go to work, school, or public areas, and do not use public transportation or taxis.  Call ahead before visiting your doctor Before your medical appointment, call the healthcare provider and tell them that you have, or are being evaluated for, COVID-19 infection. This will help the healthcare provider's office take steps to keep other people from getting infected. Ask your healthcare provider to call the local or state health department.  Monitor your symptoms Seek prompt medical attention if your illness is worsening (e.g., difficulty breathing). Before going to your medical appointment, call the healthcare provider and tell them that you have, or are being evaluated for, COVID-19 infection. Ask your healthcare provider to call the local or state health department.  Wear a facemask You should wear a facemask that covers  your nose and mouth when you are in the same room with other people and when you visit a healthcare provider. People who live with or visit you should also wear a facemask while they are in the same room with you.  Separate yourself from other people in your home As much as possible, you should stay in a different room from other people in your home. Also, you should use a separate bathroom, if available.  Avoid sharing household items You should not share dishes, drinking glasses, cups, eating utensils, towels, bedding, or other items with other people in your home. After using these items, you should wash them thoroughly with soap and water.  Cover your coughs and sneezes Cover your mouth and nose with a tissue when you cough or sneeze, or you can cough or sneeze into your sleeve. Throw used tissues in a lined trash can, and immediately wash your hands with soap and water for at least 20 seconds or use an alcohol-based hand rub.  Wash your [employer] your hands often and thoroughly with soap and water for at least 20 seconds. You can use an alcohol-based hand sanitizer if soap and water are not available and if your hands are not visibly dirty. Avoid touching your eyes, nose, and mouth with unwashed hands.   Prevention Steps for Caregivers and Household Members of Individuals Confirmed to have, or Being Evaluated for, COVID-19 Infection Being Cared for in the Home  If you live with, or provide care at home for, a person confirmed to have, or being evaluated for, COVID-19 infection please follow these guidelines to  prevent infection:  Follow healthcare provider's instructions Make sure that you understand and can help the patient follow any healthcare provider instructions for all care.  Provide for the patient's basic needs You should help the patient with basic needs in the home and provide support for getting groceries, prescriptions, and other personal needs.  Monitor the  patient's symptoms If they are getting sicker, call his or her medical provider and tell them that the patient has, or is being evaluated for, COVID-19 infection. This will help the healthcare provider's office take steps to keep other people from getting infected. Ask the healthcare provider to call the local or state health department.  Limit the number of people who have contact with the patient If possible, have only one caregiver for the patient. Other household members should stay in another home or place of residence. If this is not possible, they should stay in another room, or be separated from the patient as much as possible. Use a separate bathroom, if available. Restrict visitors who do not have an essential need to be in the home.  Keep older adults, very young children, and other sick people away from the patient Keep older adults, very young children, and those who have compromised immune systems or chronic health conditions away from the patient. This includes people with chronic heart, lung, or kidney conditions, diabetes, and cancer.  Ensure good ventilation Make sure that shared spaces in the home have good air flow, such as from an air conditioner or an opened window, weather permitting.  Wash your hands often Wash your hands often and thoroughly with soap and water for at least 20 seconds. You can use an alcohol based hand sanitizer if soap and water are not available and if your hands are not visibly dirty. Avoid touching your eyes, nose, and mouth with unwashed hands. Use disposable paper towels to dry your hands. If not available, use dedicated cloth towels and replace them when they become wet.  Wear a facemask and gloves Wear a disposable facemask at all times in the room and gloves when you touch or have contact with the patient's blood, body fluids, and/or secretions or excretions, such as sweat, saliva, sputum, nasal mucus, vomit, urine, or feces.  Ensure the mask  fits over your nose and mouth tightly, and do not touch it during use. Throw out disposable facemasks and gloves after using them. Do not reuse. Wash your hands immediately after removing your facemask and gloves. If your personal clothing becomes contaminated, carefully remove clothing and launder. Wash your hands after handling contaminated clothing. Place all used disposable facemasks, gloves, and other waste in a lined container before disposing them with other household waste. Remove gloves and wash your hands immediately after handling these items.  Do not share dishes, glasses, or other household items with the patient Avoid sharing household items. You should not share dishes, drinking glasses, cups, eating utensils, towels, bedding, or other items with a patient who is confirmed to have, or being evaluated for, COVID-19 infection. After the person uses these items, you should wash them thoroughly with soap and water.  Wash laundry thoroughly Immediately remove and wash clothes or bedding that have blood, body fluids, and/or secretions or excretions, such as sweat, saliva, sputum, nasal mucus, vomit, urine, or feces, on them. Wear gloves when handling laundry from the patient. Read and follow directions on labels of laundry or clothing items and detergent. In general, wash and dry with the warmest temperatures recommended on  the label.  Clean all areas the individual has used often Clean all touchable surfaces, such as counters, tabletops, doorknobs, bathroom fixtures, toilets, phones, keyboards, tablets, and bedside tables, every day. Also, clean any surfaces that may have blood, body fluids, and/or secretions or excretions on them. Wear gloves when cleaning surfaces the patient has come in contact with. Use a diluted bleach solution (e.g., dilute bleach with 1 part bleach and 10 parts water) or a household disinfectant with a label that says EPA-registered for coronaviruses. To make a  bleach solution at home, add 1 tablespoon of bleach to 1 quart (4 cups) of water. For a larger supply, add  cup of bleach to 1 gallon (16 cups) of water. Read labels of cleaning products and follow recommendations provided on product labels. Labels contain instructions for safe and effective use of the cleaning product including precautions you should take when applying the product, such as wearing gloves or eye protection and making sure you have good ventilation during use of the product. Remove gloves and wash hands immediately after cleaning.  Monitor yourself for signs and symptoms of illness Caregivers and household members are considered close contacts, should monitor their health, and will be asked to limit movement outside of the home to the extent possible. Follow the monitoring steps for close contacts listed on the symptom monitoring form.   ? If you have additional questions, contact your local health department or call the epidemiologist on call at [phone] (available 24/7). ? This guidance is subject to change. For the most up-to-date guidance from [facility], please refer to their website: [email or web address]

## 2020-09-27 ENCOUNTER — Encounter: Payer: Self-pay | Admitting: Family Medicine

## 2020-09-27 ENCOUNTER — Other Ambulatory Visit: Payer: Self-pay

## 2020-09-27 ENCOUNTER — Ambulatory Visit (INDEPENDENT_AMBULATORY_CARE_PROVIDER_SITE_OTHER): Payer: BC Managed Care – PPO | Admitting: Family Medicine

## 2020-09-27 VITALS — BP 116/76 | HR 75 | Temp 98.1°F | Ht 66.0 in | Wt 209.4 lb

## 2020-09-27 DIAGNOSIS — Z0000 Encounter for general adult medical examination without abnormal findings: Secondary | ICD-10-CM | POA: Diagnosis not present

## 2020-09-27 DIAGNOSIS — E039 Hypothyroidism, unspecified: Secondary | ICD-10-CM | POA: Diagnosis not present

## 2020-09-27 DIAGNOSIS — Z13220 Encounter for screening for lipoid disorders: Secondary | ICD-10-CM

## 2020-09-27 DIAGNOSIS — R7309 Other abnormal glucose: Secondary | ICD-10-CM | POA: Diagnosis not present

## 2020-09-27 NOTE — Assessment & Plan Note (Signed)
Well adult Orders Placed This Encounter  Procedures  . COMPLETE METABOLIC PANEL WITH GFR  . CBC with Differential  . Lipid Panel w/reflex Direct LDL  . TSH + free T4  Screenings: She plans to schedule colon cancer screening.  She is up-to-date on Pap. Immunizations: She does not want COVID-vaccine. Anticipatory guidance/risk factor reduction: Recommendations per AVS.

## 2020-09-27 NOTE — Patient Instructions (Signed)
Preventive Care 47-47 Years Old, Female Preventive care refers to lifestyle choices and visits with your health care provider that can promote health and wellness. This includes: A yearly physical exam. This is also called an annual wellness visit. Regular dental and eye exams. Immunizations. Screening for certain conditions. Healthy lifestyle choices, such as: Eating a healthy diet. Getting regular exercise. Not using drugs or products that contain nicotine and tobacco. Limiting alcohol use. What can I expect for my preventive care visit? Physical exam Your health care provider will check your: Height and weight. These may be used to calculate your BMI (body mass index). BMI is a measurement that tells if you are at a healthy weight. Heart rate and blood pressure. Body temperature. Skin for abnormal spots. Counseling Your health care provider may ask you questions about your: Past medical problems. Family's medical history. Alcohol, tobacco, and drug use. Emotional well-being. Home life and relationship well-being. Sexual activity. Diet, exercise, and sleep habits. Work and work Statistician. Access to firearms. Method of birth control. Menstrual cycle. Pregnancy history. What immunizations do I need?  Vaccines are usually given at various ages, according to a schedule. Your health care provider will recommend vaccines for you based on your age, medicalhistory, and lifestyle or other factors, such as travel or where you work. What tests do I need? Blood tests Lipid and cholesterol levels. These may be checked every 5 years, or more often if you are over 47 years old. Hepatitis C test. Hepatitis B test. Screening Lung cancer screening. You may have this screening every year starting at age 30 if you have a 30-pack-year history of smoking and currently smoke or have quit within the past 15 years. Colorectal cancer screening. All adults should have this screening starting at  age 23 and continuing until age 47. Your health care provider may recommend screening at age 47 if you are at increased risk. You will have tests every 1-10 years, depending on your results and the type of screening test. Diabetes screening. This is done by checking your blood sugar (glucose) after you have not eaten for a while (fasting). You may have this done every 1-3 years. Mammogram. This may be done every 1-2 years. Talk with your health care provider about when you should start having regular mammograms. This may depend on whether you have a family history of breast cancer. BRCA-related cancer screening. This may be done if you have a family history of breast, ovarian, tubal, or peritoneal cancers. Pelvic exam and Pap test. This may be done every 3 years starting at age 47. Starting at age 54, this may be done every 5 years if you have a Pap test in combination with an HPV test. Other tests STD (sexually transmitted disease) testing, if you are at risk. Bone density scan. This is done to screen for osteoporosis. You may have this scan if you are at high risk for osteoporosis. Talk with your health care provider about your test results, treatment options,and if necessary, the need for more tests. Follow these instructions at home: Eating and drinking  Eat a diet that includes fresh fruits and vegetables, whole grains, lean protein, and low-fat dairy products. Take vitamin and mineral supplements as recommended by your health care provider. Do not drink alcohol if: Your health care provider tells you not to drink. You are pregnant, may be pregnant, or are planning to become pregnant. If you drink alcohol: Limit how much you have to 0-1 drink a day. Be aware  of how much alcohol is in your drink. In the [place] one drink equals one 12 oz bottle of beer (355 mL), one 5 oz glass of wine (148 mL), or one 1 oz glass of hard liquor (44 mL).  Lifestyle Take daily care of your teeth and  gums. Brush your teeth every morning and night with fluoride toothpaste. Floss one time each day. Stay active. Exercise for at least 30 minutes 5 or more days each week. Do not use any products that contain nicotine or tobacco, such as cigarettes, e-cigarettes, and chewing tobacco. If you need help quitting, ask your health care provider. Do not use drugs. If you are sexually active, practice safe sex. Use a condom or other form of protection to prevent STIs (sexually transmitted infections). If you do not wish to become pregnant, use a form of birth control. If you plan to become pregnant, see your health care provider for a prepregnancy visit. If told by your health care provider, take low-dose aspirin daily starting at age 37. Find healthy ways to cope with stress, such as: Meditation, yoga, or listening to music. Journaling. Talking to a trusted person. Spending time with friends and family. Safety Always wear your seat belt while driving or riding in a vehicle. Do not drive: If you have been drinking alcohol. Do not ride with someone who has been drinking. When you are tired or distracted. While texting. Wear a helmet and other protective equipment during sports activities. If you have firearms in your house, make sure you follow all gun safety procedures. What's next? Visit your health care provider once a year for an annual wellness visit. Ask your health care provider how often you should have your eyes and teeth checked. Stay up to date on all vaccines. This information is not intended to replace advice given to you by your health care provider. Make sure you discuss any questions you have with your healthcare provider. Document Revised: 11/08/2019 Document Reviewed: 10/15/2017 Elsevier Patient Education  2022 [employer].

## 2020-09-27 NOTE — Progress Notes (Signed)
Barbara [name] - 45 y.o. female MRN [id number]  Date of birth: [birth year]  Subjective Chief Complaint  Patient presents with   Annual Exam    HPI Barbara [name] is a 45 year old female here today for annual exam.  She did recently have COVID and is recovering well from this.  She does have some continued fatigue.  She does follow annually with her bariatric surgeon and has labs to check vitamin and mineral levels.  She is a non-smoker.  She denies alcohol use.  She does walk regularly for exercise.  She feels like her diet is fairly healthy.  She does try to stick to bariatric diet.  She is up-to-date on Pap.  She does plan to have colon cancer screening.  Review of Systems  Constitutional:  Negative for chills, fever, malaise/fatigue and weight loss.  HENT:  Negative for congestion, ear pain and sore throat.   Eyes:  Negative for blurred vision, double vision and pain.  Respiratory:  Negative for cough and shortness of breath.   Cardiovascular:  Negative for chest pain and palpitations.  Gastrointestinal:  Negative for abdominal pain, blood in stool, constipation, heartburn and nausea.  Genitourinary:  Negative for dysuria and urgency.  Musculoskeletal:  Negative for joint pain and myalgias.  Neurological:  Negative for dizziness and headaches.  Endo/Heme/Allergies:  Does not bruise/bleed easily.  Psychiatric/Behavioral:  Negative for depression. The patient is not nervous/anxious and does not have insomnia.    Allergies  Allergen Reactions   Sulfamethoxazole-Trimethoprim Nausea And Vomiting and Rash   Ibuprofen     Had gastric sleeve   Penicillins Hives    Tolerates cephalosporins. Has patient had a PCN reaction causing immediate rash, facial/tongue/throat swelling, SOB or lightheadedness with hypotension: No Has patient had a PCN reaction causing severe rash involving mucus membranes or skin necrosis: No Has patient had a PCN reaction that required hospitalization: No Has patient  had a PCN reaction occurring within the last 10 years: No If all of the above answers are "NO", then may proceed with Cephalosporin use.    Prednisone Hives   Latex Rash    Some gloves and tape cause red and raw skin. Skin peels off.     Past Medical History:  Diagnosis Date   Anxiety    Depression    History of palpitations 2011   work-up with cardiologist with event monitor, showed no arrhythmias   History of panic attacks    Migraine headache    Morbid obesity (HCC)    PONV (postoperative nausea and vomiting)     Past Surgical History:  Procedure Laterality Date   D & C HYSTEROSCOPY W/ ENDOMETRIAL ABLATION  01-29-2005   dr Despina Hidden  @[facility]    ELBOW SURGERY Left    ESOPHAGOGASTRODUODENOSCOPY N/A 05/18/2014   SLF: 1. Mild esophaigitis. 2. Mild non-erosive gastritis.    LAPAROSCOPIC GASTRIC SLEEVE RESECTION N/A 12/22/2017   Procedure: LAPAROSCOPIC GASTRIC SLEEVE RESECTION, UPPER ENDO, ERAS PATHWAY;  Surgeon: 13/06/2017, MD;  Location: [facility];  Service: General;  Laterality: N/A;   ORIF RADIUS & ULNA FRACTURES Left 11-01-2006   dr 11-03-2006 @[facility]    ULNAR NERVE TRANSPOSITION Left 2011   AND REMOVAL HARDWARE OF WRIST    Social History   Socioeconomic History   Marital status: Married    Spouse name: Not on file   Number of children: Not on file   Years of education: Not on file   Highest education level: Not on file  Occupational History  Not on file  Tobacco Use   Smoking status: Never   Smokeless tobacco: Never  Vaping Use   Vaping Use: Never used  Substance and Sexual Activity   Alcohol use: No   Drug use: No   Sexual activity: Yes    Birth control/protection: Other-see comments    Comment: vasectomy; ablation  Other Topics Concern   Not on file  Social History Narrative   Not on file   Social Determinants of Health   Financial Resource Strain: Not on file  Food Insecurity: Not on file  Transportation Needs: Not on file  Physical Activity: Not on file   Stress: Not on file  Social Connections: Not on file    Family History  Problem Relation Age of Onset   Hypertension Father    Hyperlipidemia Father    Diabetes Father    Diabetes Paternal Grandmother    Hyperlipidemia Paternal Grandmother    Hypertension Paternal Grandmother    Breast cancer Paternal Aunt    Colon cancer Neg Hx     Health Maintenance  Topic Date Due   COVID-19 Vaccine (1) Never done   HIV Screening  Never done   Hepatitis C Screening  Never done   COLONOSCOPY (Pts 45-59yrs Insurance coverage will need to be confirmed)  Never done   INFLUENZA VACCINE  09/17/2020   MAMMOGRAM  01/30/2021   PAP SMEAR-Modifier  01/05/2022   TETANUS/TDAP  11/25/2028   Pneumococcal Vaccine 88-62 Years old  Aged Out   HPV VACCINES  Aged Out     ----------------------------------------------------------------------------------------------------------------------------------------------------------------------------------------------------------------- Physical Exam BP 116/76 (BP Location: Left Arm, Patient Position: Sitting, Cuff Size: Normal)   Pulse 75   Temp 98.1 F (36.7 C)   Ht 5\' 6"  (1.676 m)   Wt 209 lb 6.4 oz (95 kg)   SpO2 98%   BMI 33.80 kg/m   Physical Exam Constitutional:      General: She is not in acute distress. HENT:     Head: Normocephalic and atraumatic.     Right Ear: Tympanic membrane and ear canal normal.     Left Ear: Tympanic membrane and ear canal normal.     Nose: Nose normal.  Eyes:     General: No scleral icterus.    Conjunctiva/sclera: Conjunctivae normal.  Neck:     Thyroid: No thyromegaly.  Cardiovascular:     Rate and Rhythm: Normal rate and regular rhythm.     Heart sounds: Normal heart sounds.  Pulmonary:     Effort: Pulmonary effort is normal.     Breath sounds: Normal breath sounds.  Abdominal:     General: Bowel sounds are normal. There is no distension.     Palpations: Abdomen is soft.     Tenderness: There is no abdominal  tenderness. There is no guarding.  Musculoskeletal:        General: Normal range of motion.     Cervical back: Normal range of motion and neck supple.  Lymphadenopathy:     Cervical: No cervical adenopathy.  Skin:    General: Skin is warm and dry.     Findings: No rash.  Neurological:     General: No focal deficit present.     Mental Status: She is alert and oriented to person, place, and time.     Cranial Nerves: No cranial nerve deficit.     Coordination: Coordination normal.  Psychiatric:        Mood and Affect: Mood normal.  Behavior: Behavior normal.    ------------------------------------------------------------------------------------------------------------------------------------------------------------------------------------------------------------------- Assessment and Plan  Well adult exam Well adult Orders Placed This Encounter  Procedures   COMPLETE METABOLIC PANEL WITH GFR   CBC with Differential   Lipid Panel w/reflex Direct LDL   TSH + free T4  Screenings: She plans to schedule colon cancer screening.  She is up-to-date on Pap. Immunizations: She does not want COVID-vaccine. Anticipatory guidance/risk factor reduction: Recommendations per AVS.   No orders of the defined types were placed in this encounter.   No follow-ups on file.    This visit occurred during the SARS-CoV-2 public health emergency.  Safety protocols were in place, including screening questions prior to the visit, additional usage of staff PPE, and extensive cleaning of exam room while observing appropriate contact time as indicated for disinfecting solutions.

## 2020-09-30 ENCOUNTER — Encounter: Payer: Self-pay | Admitting: Family Medicine

## 2020-10-01 NOTE — Telephone Encounter (Signed)
A1c was not ordered on 09/27/2020.  Called [facility] and added on this test.  Order given to [place] at Laurina Bustle, CMA

## 2020-10-02 LAB — COMPLETE METABOLIC PANEL WITH GFR
AG Ratio: 1.8 (calc) (ref 1.0–2.5)
ALT: 15 U/L (ref 6–29)
AST: 18 U/L (ref 10–35)
Albumin: 4 g/dL (ref 3.6–5.1)
Alkaline phosphatase (APISO): 53 U/L (ref 31–125)
BUN: 13 mg/dL (ref 7–25)
CO2: 31 mmol/L (ref 20–32)
Calcium: 9.4 mg/dL (ref 8.6–10.2)
Chloride: 103 mmol/L (ref 98–110)
Creat: 0.79 mg/dL (ref 0.50–0.99)
Globulin: 2.2 g/dL (calc) (ref 1.9–3.7)
Glucose, Bld: 81 mg/dL (ref 65–99)
Potassium: 4.7 mmol/L (ref 3.5–5.3)
Sodium: 139 mmol/L (ref 135–146)
Total Bilirubin: 0.6 mg/dL (ref 0.2–1.2)
Total Protein: 6.2 g/dL (ref 6.1–8.1)
eGFR: 93 mL/min/{1.73_m2} (ref >=60)

## 2020-10-02 LAB — LIPID PANEL W/REFLEX DIRECT LDL
Cholesterol: 173 mg/dL (ref <200)
HDL: 62 mg/dL (ref >=50)
LDL Cholesterol (Calc): 93 mg/dL (calc)
Non-HDL Cholesterol (Calc): 111 mg/dL (calc) (ref <130)
Total CHOL/HDL Ratio: 2.8 (calc) (ref <5.0)
Triglycerides: 89 mg/dL (ref <150)

## 2020-10-02 LAB — CBC WITH DIFFERENTIAL/PLATELET
Absolute Monocytes: 591 cells/uL (ref 200–950)
Basophils Absolute: 57 cells/uL (ref 0–200)
Basophils Relative: 0.7 %
Eosinophils Absolute: 73 cells/uL (ref 15–500)
Eosinophils Relative: 0.9 %
HCT: 46.2 % — ABNORMAL HIGH (ref 35.0–45.0)
Hemoglobin: 15.4 g/dL (ref 11.7–15.5)
Lymphs Abs: 2017 cells/uL (ref 850–3900)
MCH: 30.8 pg (ref 27.0–33.0)
MCHC: 33.3 g/dL (ref 32.0–36.0)
MCV: 92.4 fL (ref 80.0–100.0)
MPV: 10.3 fL (ref 7.5–12.5)
Monocytes Relative: 7.3 %
Neutro Abs: 5362 cells/uL (ref 1500–7800)
Neutrophils Relative %: 66.2 %
Platelets: 403 10*3/uL — ABNORMAL HIGH (ref 140–400)
RBC: 5 10*6/uL (ref 3.80–5.10)
RDW: 12.1 % (ref 11.0–15.0)
Total Lymphocyte: 24.9 %
WBC: 8.1 10*3/uL (ref 3.8–10.8)

## 2020-10-02 LAB — TEST AUTHORIZATION

## 2020-10-02 LAB — HEMOGLOBIN A1C
Hgb A1c MFr Bld: 5 % of total Hgb (ref <5.7)
Mean Plasma Glucose: 97 mg/dL
eAG (mmol/L): 5.4 mmol/L

## 2020-10-02 LAB — TSH+FREE T4: TSH W/REFLEX TO FT4: 1.88 mIU/L

## 2020-10-03 ENCOUNTER — Encounter: Payer: Self-pay | Admitting: Family Medicine

## 2020-10-03 DIAGNOSIS — Z1211 Encounter for screening for malignant neoplasm of colon: Secondary | ICD-10-CM

## 2020-10-04 ENCOUNTER — Encounter: Payer: Self-pay | Admitting: Internal Medicine

## 2020-10-29 ENCOUNTER — Telehealth: Payer: Self-pay | Admitting: *Deleted

## 2020-10-29 NOTE — Telephone Encounter (Signed)
PLEASE CALL PATIENT, SHE HAS A QUESTION ABOUT THE COLONOSCOPY

## 2020-10-29 NOTE — Telephone Encounter (Signed)
Called pt back and answered all questions.

## 2020-11-06 ENCOUNTER — Other Ambulatory Visit: Payer: Self-pay

## 2020-11-06 ENCOUNTER — Ambulatory Visit (INDEPENDENT_AMBULATORY_CARE_PROVIDER_SITE_OTHER): Payer: Self-pay | Admitting: *Deleted

## 2020-11-06 VITALS — Ht 66.5 in | Wt 207.6 lb

## 2020-11-06 DIAGNOSIS — Z1211 Encounter for screening for malignant neoplasm of colon: Secondary | ICD-10-CM

## 2020-11-06 MED ORDER — CLENPIQ 10-3.5-12 MG-GM -GM/160ML PO SOLN: 1.0000 | Freq: Once | ORAL | 0 refills | Status: AC

## 2020-11-06 NOTE — Progress Notes (Signed)
Pt aware to go have preg test on 11/23/2020.

## 2020-11-06 NOTE — Progress Notes (Addendum)
Gastroenterology Pre-Procedure Review  Request Date: 11/06/2020 Requesting Physician: Dr. Everrett Coombe @ [facility] @ [facility], no previous TCS, family hx of colon cancer (great grandmother)  PATIENT REVIEW QUESTIONS: The patient responded to the following health history questions as indicated:    1. Diabetes Melitis: no 2. Joint replacements in the past 12 months: no 3. Major health problems in the past 3 months: no 4. Has an artificial valve or MVP: no 5. Has a defibrillator: no 6. Has been advised in past to take antibiotics in advance of a procedure like teeth cleaning: no 7. Family history of colon cancer: yes, great grandmother: age 75's 8. Alcohol Use: no 9. Illicit drug Use: no 10. History of sleep apnea: no 11. History of coronary artery or other vascular stents placed within the last 12 months: no 12. History of any prior anesthesia complications: yes, n/v when she had gastric sleeve 2019 13. Body mass index is 33.01 kg/m.    MEDICATIONS & ALLERGIES:    Patient reports the following regarding taking any blood thinners:   Plavix? no Aspirin? no Coumadin? no Brilinta? no Xarelto? no Eliquis? no Pradaxa? no Savaysa? no Effient? no  Patient confirms/reports the following medications:  Current Outpatient Medications  Medication Sig Dispense Refill   Calcium-Phosphorus-Vitamin D (CALCIUM/D3 ADULT GUMMIES PO) Take by mouth 3 (three) times daily.     cetirizine (ZYRTEC) 10 MG tablet Take 10 mg by mouth daily.     escitalopram (LEXAPRO) 10 MG tablet Take 1 tablet (10 mg total) by mouth daily. 90 tablet 1   Multiple Vitamins-Minerals (BARIATRIC MULTIVITAMINS/IRON PO) Take 1 tablet by mouth daily.      No current facility-administered medications for this visit.    Patient confirms/reports the following allergies:  Allergies  Allergen Reactions   Sulfamethoxazole-Trimethoprim Nausea And Vomiting and Rash   Ibuprofen     Had gastric sleeve    Penicillins Hives    Tolerates cephalosporins. Has patient had a PCN reaction causing immediate rash, facial/tongue/throat swelling, SOB or lightheadedness with hypotension: No Has patient had a PCN reaction causing severe rash involving mucus membranes or skin necrosis: No Has patient had a PCN reaction that required hospitalization: No Has patient had a PCN reaction occurring within the last 10 years: No If all of the above answers are "NO", then may proceed with Cephalosporin use.    Prednisone Hives   Latex Rash    Some gloves and tape cause red and raw skin. Skin peels off.     No orders of the defined types were placed in this encounter.   AUTHORIZATION INFORMATION Primary Insurance: [place],  [place] #: [id number],  Group #: [id number] Pre-Cert / Berkley Harvey required: Yes, approved per Alcario Drought [id number] Pre-Cert / Berkley Harvey #: [id number]  SCHEDULE INFORMATION: Procedure has been scheduled as follows:  Date: 11/27/2020 , Time:  9:30 Location: [facility] with Dr. Marletta Lor  This Gastroenterology Pre-Precedure Review Form is being routed to the following provider(s): Tana Coast, PA-C

## 2020-11-06 NOTE — Patient Instructions (Addendum)
CLENPIQ INSTRUCTIONS   REMEMBER LABS ON 11/23/2020.   Patient Name:  Barbara [name] Date of procedure: 11/27/2020 Time to register at [facility]: 8:00 am  Provider:  Dr. Abbey Chatters  Please notify us immediately if you are diabetic, take iron supplements, or if you are on coumadin or any blood thinners.  Please hold the following medications:   Note: Do NOT refrigerate or freeze CLENPIQ. CLENPIQ is ready to drink. There is no need to add any other liquid or mix the medicine in the bottle before you start dosing.   11/26/2020-  1 Day prior to procedure:  Monday  CLEAR LIQUIDS ALL DAY--NO SOLID FOODS OR DAIRY PRODUCTS! See list of liquids that are allowed and items that are NOT allowed below.  Diabetic Medication Instructions:  n/a  You must drink plenty of CLEAR LIQUIDS starting before your bowel prep. It is important to stay adequately hydrated before, during, and after your bowel prep for the prep to work effectively!  At 4:00 PM Begin the prep as follows:    Drink one bottle of pre-mixed CLENPIQ right from the bottle.  Drink at least five (5) 8-ounce drinks of clear liquids of your choice within the next 5 hours  At 10:00 PM: Drink the second bottle of pre-mixed CLENPIQ right from the bottle.   Drink at least three (3) 8-ounce drinks of clear liquids of your choice within the next 3 hours before going to bed.  Continue clear liquids.    11/27/2020-  Day of Procedure: Tuesday  Diabetic medications adjustments: n/a  You may take TYLENOL products.  Please continue your regular medications unless we have instructed you otherwise.    At 3 hours before procedure @ 6:30 am: Stop drinking all liquids, nothing by mouth at this point.   Please note, on the day of your procedure you MUST be accompanied by an adult who is willing to assume responsibility for you at time of discharge. If you do not have such person with you, your procedure will have to be rescheduled.                                                                                                                      Please leave ALL jewelry at home prior to coming to the hospital for your procedure.   *It is your responsibility to check with your insurance company for the benefits of coverage you have for this procedure. Unfortunately, not all insurance companies have benefits to cover all or part of these types of procedures. It is your responsibility to check your benefits, however we will be glad to assist you with any codes your insurance company may need.   Please note that most insurance companies will not cover a screening colonoscopy for people under the age of 9  For example, with some insurance companies you may have benefits for a screening colonoscopy, but if polyps are found the diagnosis will change and then you may have a deductible  that will need to be met. Please make sure you check your benefits for screening colonoscopy as well as a diagnostic colonoscopy.    CLEAR LIQUIDS: (NO RED or PURPLE) Water  Jello   Apple Juice  White Grape Juice   Kool-Aid Soft drinks  Banana popsicles Sports Drink  Black coffee (No cream or milk) Tea (No cream or milk)  Broth (fat free beef/chicken/vegetable)  Clear liquids allow you to see your fingers on the other side of the glass.  Be sure they are NOT RED or PURPLE in color, cloudy, but CLEAR.  Do Not Eat: Dairy products of any kind Cranberry juice Tomato or V8 Juice  Orange Juice   Grapefruit Juice Red Grape Juice Alcohol   Non-dairy creamer Solid foods like cereal, oatmeal, yogurt, fruits, vegetables, creamed soups, eggs, bread, etc   HELPFUL HINTS TO MAKE DRINKING EASIER: -Trying drinking through a straw. -If you become nauseated, try consuming smaller amounts or stretch out the time between glasses.  Stop for 30 minutes & slowly start back drinking.  Call our office with any questions or concerns at [phone].  Thank You,  Christ Kick, [place]

## 2020-11-06 NOTE — Progress Notes (Signed)
Barbara [name]:  Will pt need to hold bariatric multivitamin/ iron ?

## 2020-11-07 ENCOUNTER — Encounter (INDEPENDENT_AMBULATORY_CARE_PROVIDER_SITE_OTHER): Payer: Self-pay

## 2020-11-07 ENCOUNTER — Encounter: Payer: Self-pay | Admitting: *Deleted

## 2020-11-07 ENCOUNTER — Other Ambulatory Visit: Payer: Self-pay | Admitting: *Deleted

## 2020-11-07 DIAGNOSIS — Z1211 Encounter for screening for malignant neoplasm of colon: Secondary | ICD-10-CM

## 2020-11-07 NOTE — Progress Notes (Signed)
Jim from [place] called back and informed me that we are out of pt's network.  He said that he was going to call her and to expect a call back from her once he had went over her benefit information.    Spoke to pt.  She was made aware and she had also heard from Odin at [place].  She would really like [place] to do her procedure here in [place] but can't afford out of network expense.  She was told by Rosanne Ashing that [facility] in [place] is in her network.    Reba:  Can you help with this?

## 2020-11-07 NOTE — Addendum Note (Signed)
Addended by: Noreene Larsson on: 11/07/2020 10:53 AM   Modules accepted: Orders

## 2020-11-07 NOTE — Progress Notes (Signed)
[employer] and spoke to [place].  Members can have colonoscopies starting at age 47.  Ref#: [id number]

## 2020-11-07 NOTE — Progress Notes (Signed)
Communicated with pt.  She informed me that her Bariatric Multivitamin does not have Iron in it.  Updated med list accordingly.    Leslie:  Does she still need to hold med?

## 2020-11-07 NOTE — Progress Notes (Signed)
OK to schedule. ASA II.  Hold bariatric MVI with iron for 7 days.

## 2020-11-07 NOTE — Progress Notes (Signed)
Noted.  Informed pt that she would not need to hold multivitamin since no iron in it.  Pt informed to disregard letter.

## 2020-11-07 NOTE — Progress Notes (Signed)
No need to hold if no iron in it.

## 2020-11-08 NOTE — Progress Notes (Signed)
Pt called back today.  Informed me that she would like to proceed with procedure.

## 2020-11-12 ENCOUNTER — Other Ambulatory Visit: Payer: Self-pay | Admitting: *Deleted

## 2020-11-12 NOTE — Progress Notes (Signed)
Spoke to [place] at [facility].  She was informed that we will bill the claim to the insurance company with the legal name of hospital,  [facility] ..not the department name.  She voiced understanding and approved auth.  Ref#: [id number]

## 2020-11-14 NOTE — Progress Notes (Signed)
Pt informed me that she received approval letter but it had the wrong facility [facility]) listed on it.  Called [employer] and spoke to [place] to follow up on this.  She informed me that the NPI [id number] was same for [facility] and [facility].  I informed her that procedure would be billed for [facility].  She informed me that this should not be an issue.    Pt informed me that she had spoke to [place] at [place].  She was informed that everything was good and they would keep a watch on it.  FYI for chart.  Follow up on Ref#: [id number]

## 2020-11-22 ENCOUNTER — Ambulatory Visit: Payer: BC Managed Care – PPO

## 2020-11-23 ENCOUNTER — Other Ambulatory Visit (HOSPITAL_COMMUNITY)
Admission: RE | Admit: 2020-11-23 | Discharge: 2020-11-23 | Disposition: A | Discharge status: Home / Self Care | Payer: BC Managed Care – PPO | Source: Ambulatory Visit | Attending: Internal Medicine | Admitting: Internal Medicine

## 2020-11-23 DIAGNOSIS — Z1211 Encounter for screening for malignant neoplasm of colon: Secondary | ICD-10-CM | POA: Insufficient documentation

## 2020-11-23 LAB — PREGNANCY, URINE: Preg Test, Ur: NEGATIVE

## 2020-11-27 ENCOUNTER — Ambulatory Visit (HOSPITAL_COMMUNITY): Payer: BC Managed Care – PPO | Admitting: Anesthesiology

## 2020-11-27 ENCOUNTER — Other Ambulatory Visit: Payer: Self-pay

## 2020-11-27 ENCOUNTER — Ambulatory Visit (HOSPITAL_COMMUNITY)
Admission: RE | Admit: 2020-11-27 | Discharge: 2020-11-27 | Disposition: A | Discharge status: Home / Self Care | Payer: BC Managed Care – PPO | Source: Ambulatory Visit | Attending: Internal Medicine | Admitting: Internal Medicine

## 2020-11-27 ENCOUNTER — Encounter (HOSPITAL_COMMUNITY): Payer: Self-pay

## 2020-11-27 ENCOUNTER — Encounter (HOSPITAL_COMMUNITY)
Admission: RE | Disposition: A | Discharge status: Home / Self Care | Payer: Self-pay | Source: Ambulatory Visit | Attending: Internal Medicine

## 2020-11-27 DIAGNOSIS — Z8349 Family history of other endocrine, nutritional and metabolic diseases: Secondary | ICD-10-CM | POA: Insufficient documentation

## 2020-11-27 DIAGNOSIS — Z79899 Other long term (current) drug therapy: Secondary | ICD-10-CM | POA: Insufficient documentation

## 2020-11-27 DIAGNOSIS — Z91040 Latex allergy status: Secondary | ICD-10-CM | POA: Diagnosis not present

## 2020-11-27 DIAGNOSIS — Z880 Allergy status to penicillin: Secondary | ICD-10-CM | POA: Insufficient documentation

## 2020-11-27 DIAGNOSIS — Z803 Family history of malignant neoplasm of breast: Secondary | ICD-10-CM | POA: Diagnosis not present

## 2020-11-27 DIAGNOSIS — K648 Other hemorrhoids: Secondary | ICD-10-CM | POA: Diagnosis not present

## 2020-11-27 DIAGNOSIS — K5730 Diverticulosis of large intestine without perforation or abscess without bleeding: Secondary | ICD-10-CM | POA: Diagnosis not present

## 2020-11-27 DIAGNOSIS — Z6833 Body mass index (BMI) 33.0-33.9, adult: Secondary | ICD-10-CM | POA: Insufficient documentation

## 2020-11-27 DIAGNOSIS — Z888 Allergy status to other drugs, medicaments and biological substances status: Secondary | ICD-10-CM | POA: Insufficient documentation

## 2020-11-27 DIAGNOSIS — Z833 Family history of diabetes mellitus: Secondary | ICD-10-CM | POA: Insufficient documentation

## 2020-11-27 DIAGNOSIS — Z886 Allergy status to analgesic agent status: Secondary | ICD-10-CM | POA: Diagnosis not present

## 2020-11-27 DIAGNOSIS — Z8249 Family history of ischemic heart disease and other diseases of the circulatory system: Secondary | ICD-10-CM | POA: Insufficient documentation

## 2020-11-27 DIAGNOSIS — Z7952 Long term (current) use of systemic steroids: Secondary | ICD-10-CM | POA: Diagnosis not present

## 2020-11-27 DIAGNOSIS — Z791 Long term (current) use of non-steroidal anti-inflammatories (NSAID): Secondary | ICD-10-CM | POA: Diagnosis not present

## 2020-11-27 DIAGNOSIS — Z1211 Encounter for screening for malignant neoplasm of colon: Secondary | ICD-10-CM | POA: Insufficient documentation

## 2020-11-27 DIAGNOSIS — E039 Hypothyroidism, unspecified: Secondary | ICD-10-CM | POA: Diagnosis not present

## 2020-11-27 HISTORY — PX: COLONOSCOPY WITH PROPOFOL: SHX5780

## 2020-11-27 SURGERY — COLONOSCOPY WITH PROPOFOL
Anesthesia: General

## 2020-11-27 MED ORDER — LIDOCAINE HCL (CARDIAC) PF 100 MG/5ML IV SOSY
PREFILLED_SYRINGE | INTRAVENOUS | Status: DC | PRN
  Administered 2020-11-27: 60 mg via INTRAVENOUS

## 2020-11-27 MED ORDER — STERILE WATER FOR IRRIGATION IR SOLN
Status: DC | PRN
  Administered 2020-11-27: 100 mL

## 2020-11-27 MED ORDER — LACTATED RINGERS IV SOLN: INTRAVENOUS | Status: DC | PRN

## 2020-11-27 MED ORDER — PROPOFOL 10 MG/ML IV BOLUS
INTRAVENOUS | Status: DC | PRN
  Administered 2020-11-27: 150 mg via INTRAVENOUS
  Administered 2020-11-27 (×3): 50 mg via INTRAVENOUS

## 2020-11-27 MED ORDER — LACTATED RINGERS IV SOLN: INTRAVENOUS | Status: DC

## 2020-11-27 NOTE — Transfer of Care (Signed)
Immediate Anesthesia Transfer of Care Note  Patient: Barbara [name]  Procedure(s) Performed: COLONOSCOPY WITH PROPOFOL  Patient Location: Endoscopy Unit  Anesthesia Type:General  Level of Consciousness: awake and alert   Airway & Oxygen Therapy: Patient Spontanous Breathing  Post-op Assessment: Report given to RN and Post -op Vital signs reviewed and stable  Post vital signs: Reviewed and stable  Last Vitals:  Vitals Value Taken Time  BP    Temp    Pulse    Resp    SpO2      Last Pain:  Vitals:   11/27/20 0924  TempSrc:   PainSc: 0-No pain      Patients Stated Pain Goal: 8 (11/27/20 0812)  Complications: No notable events documented.

## 2020-11-27 NOTE — Op Note (Signed)
[facility] Patient Name: Barbara [name] Procedure Date: 11/27/2020 9:08 AM MRN: [id number] Date of Birth: January 27, 1976 Attending MD: Elon Alas. Abbey Chatters DO CSN: [id number] Age: 45 Admit Type: Outpatient Procedure:                Colonoscopy Indications:              Screening for colorectal malignant neoplasm Providers:                Elon Alas. Abbey Chatters, DO, Caprice Kluver, [place]                            Risa Grill, Technician Referring MD:              Medicines:                See the Anesthesia note for documentation of the                            administered medications Complications:            No immediate complications. Estimated Blood Loss:     Estimated blood loss: none. Procedure:                Pre-Anesthesia Assessment:                           - The anesthesia plan was to use monitored                            anesthesia care (MAC).                           After obtaining informed consent, the colonoscope                            was passed under direct vision. Throughout the                            procedure, the patient's blood pressure, pulse, and                            oxygen saturations were monitored continuously. The                            [id number]-HQ190L ([id number]) scope was introduced through                            the anus and advanced to the the terminal ileum,                            with identification of the appendiceal orifice and                            IC valve. The colonoscopy was performed without                            difficulty. The patient tolerated the  procedure                            well. The quality of the bowel preparation was                            evaluated using the BBPS [facility] Bowel Preparation                            Scale) with scores of: Right Colon = 3, Transverse                            Colon = 3 and Left Colon = 3 (entire mucosa seen                            well with no residual  staining, small fragments of                            stool or opaque liquid). The total BBPS score                            equals 9. Scope In: 9:27:09 AM Scope Out: 9:39:28 AM Scope Withdrawal Time: 0 hours 8 minutes 41 seconds  Total Procedure Duration: 0 hours 12 minutes 19 seconds  Findings:      The perianal and digital rectal examinations were normal.      Non-bleeding internal hemorrhoids were found during endoscopy.      A few small-mouthed diverticula were found in the sigmoid colon.      The terminal ileum appeared normal.      The exam was otherwise without abnormality. Impression:               - Non-bleeding internal hemorrhoids.                           - Diverticulosis in the sigmoid colon.                           - The examined portion of the ileum was normal.                           - The examination was otherwise normal.                           - No specimens collected. Moderate Sedation:      Per Anesthesia Care Recommendation:           - Patient has a contact number available for                            emergencies. The signs and symptoms of potential                            delayed complications were discussed with the  patient. Return to normal activities tomorrow.                            Written discharge instructions were provided to the                            patient.                           - Resume previous diet.                           - Continue present medications.                           - Repeat colonoscopy in 10 years for screening                            purposes.                           - Return to GI clinic PRN. Procedure Code(s):        --- Professional ---                           K2409, Colorectal cancer screening; colonoscopy on                            individual not meeting criteria for high risk Diagnosis Code(s):        --- Professional ---                           Z12.11,  Encounter for screening for malignant                            neoplasm of colon                           K64.8, Other hemorrhoids                           K57.30, Diverticulosis of large intestine without                            perforation or abscess without bleeding CPT copyright 2019 American Medical Association. All rights reserved. The codes documented in this report are preliminary and upon coder review may  be revised to meet current compliance requirements. Elon Alas. Abbey Chatters, DO [place] Abbey Chatters, DO 11/27/2020 9:41:17 AM This report has been signed electronically. Number of Addenda: 0

## 2020-11-27 NOTE — H&P (Signed)
Primary Care Physician:  Everrett Coombe, DO Primary Gastroenterologist:  Dr. Marletta Lor  Pre-Procedure History & Physical: HPI:  Barbara [name] is a 45 y.o. female is here for a colonoscopy for colon cancer screening purposes.  Patient denies any family history of colorectal cancer.  No melena or hematochezia.  No abdominal pain or unintentional weight loss.  No change in bowel habits.  Overall feels well from a GI standpoint.  Past Medical History:  Diagnosis Date   Anxiety    Depression    History of palpitations 2011   work-up with cardiologist with event monitor, showed no arrhythmias   History of panic attacks    Migraine headache    Morbid obesity (HCC)    PONV (postoperative nausea and vomiting)     Past Surgical History:  Procedure Laterality Date   D & C HYSTEROSCOPY W/ ENDOMETRIAL ABLATION  01-29-2005   dr Despina Hidden  @[facility]    ELBOW SURGERY Left    ESOPHAGOGASTRODUODENOSCOPY N/A 05/18/2014   SLF: 1. Mild esophaigitis. 2. Mild non-erosive gastritis.    LAPAROSCOPIC GASTRIC SLEEVE RESECTION N/A 12/22/2017   Procedure: LAPAROSCOPIC GASTRIC SLEEVE RESECTION, UPPER ENDO, ERAS PATHWAY;  Surgeon: 13/06/2017, MD;  Location: [facility];  Service: General;  Laterality: N/A;   ORIF RADIUS & ULNA FRACTURES Left 11-01-2006   dr 11-03-2006 @[facility]    ULNAR NERVE TRANSPOSITION Left 2011   AND REMOVAL HARDWARE OF WRIST    Prior to Admission medications   Medication Sig Start Date End Date Taking? Authorizing Provider  ALPRAZolam ) 0.5 MG tablet Take 0.25 mg by mouth daily as needed for anxiety.   Yes [provider]  Calcium-Phosphorus-Vitamin D (CALCIUM/D3 ADULT GUMMIES PO) Take 1 tablet by mouth 3 (three) times daily.   Yes [provider]  cetirizine (ZYRTEC) 10 MG tablet Take 10 mg by mouth daily.   Yes [provider]  escitalopram (LEXAPRO) 10 MG tablet Take 1 tablet (10 mg total) by mouth daily. Patient taking differently: Take 10 mg by mouth at bedtime.  01/05/20  Yes Prudy Feeler, DO  Multiple Vitamins-Minerals (BARIATRIC FUSION) CHEW Chew 1 tablet by mouth in the morning. NO IRON   Yes [provider]    Allergies as of 11/07/2020 - Review Complete 11/06/2020  Allergen Reaction Noted   Sulfamethoxazole-trimethoprim Nausea And Vomiting and Rash 12/17/2017   Ibuprofen  01/06/2019   Penicillins Hives    Prednisone Hives 05/04/2014   Latex Rash 07/23/2012    Family History  Problem Relation Age of Onset   Hypertension Father    Hyperlipidemia Father    Diabetes Father    Diabetes Paternal Grandmother    Hyperlipidemia Paternal Grandmother    Hypertension Paternal Grandmother    Breast cancer Paternal Aunt    Colon cancer Neg Hx     Social History   Socioeconomic History   Marital status: Married    Spouse name: Not on file   Number of children: Not on file   Years of education: Not on file   Highest education level: Not on file  Occupational History   Not on file  Tobacco Use   Smoking status: Never   Smokeless tobacco: Never  Vaping Use   Vaping Use: Never used  Substance and Sexual Activity   Alcohol use: No   Drug use: No   Sexual activity: Yes    Birth control/protection: Other-see comments    Comment: vasectomy; ablation  Other Topics Concern   Not on file  Social History  Narrative   Not on file   Social Determinants of Health   Financial Resource Strain: Not on file  Food Insecurity: Not on file  Transportation Needs: Not on file  Physical Activity: Not on file  Stress: Not on file  Social Connections: Not on file  Intimate Partner Violence: Not on file    Review of Systems: See HPI, otherwise negative ROS  Physical Exam: Vital signs in last 24 hours: Temp:  [98.5 F (36.9 C)] 98.5 F (36.9 C) (10/11 0812) Pulse Rate:  [78] 78 (10/11 0812) Resp:  [18] 18 (10/11 0812) BP: (122)/(82) 122/82 (10/11 0812) SpO2:  [99 %] 99 % (10/11 0812) Weight:  [94.2 kg] 94.2 kg (10/11 0812)    General:   Alert,  Well-developed, well-nourished, pleasant and cooperative in NAD Head:  Normocephalic and atraumatic. Eyes:  Sclera clear, no icterus.   Conjunctiva pink. Ears:  Normal auditory acuity. Nose:  No deformity, discharge,  or lesions. Mouth:  No deformity or lesions, dentition normal. Neck:  Supple; no masses or thyromegaly. Lungs:  Clear throughout to auscultation.   No wheezes, crackles, or rhonchi. No acute distress. Heart:  Regular rate and rhythm; no murmurs, clicks, rubs,  or gallops. Abdomen:  Soft, nontender and nondistended. No masses, hepatosplenomegaly or hernias noted. Normal bowel sounds, without guarding, and without rebound.   Msk:  Symmetrical without gross deformities. Normal posture. Extremities:  Without clubbing or edema. Neurologic:  Alert and  oriented x4;  grossly normal neurologically. Skin:  Intact without significant lesions or rashes. Cervical Nodes:  No significant cervical adenopathy. Psych:  Alert and cooperative. Normal mood and affect.  Impression/Plan: Barbara [name] is here for a colonoscopy to be performed for colon cancer screening purposes.  The risks of the procedure including infection, bleed, or perforation as well as benefits, limitations, alternatives and imponderables have been reviewed with the patient. Questions have been answered. All parties agreeable.

## 2020-11-27 NOTE — Anesthesia Preprocedure Evaluation (Addendum)
Anesthesia Evaluation  Patient identified by MRN, date of birth, ID band Patient awake    Reviewed: Allergy & Precautions, NPO status , Patient's Chart, lab work & pertinent test results  History of Anesthesia Complications (+) PONV and history of anesthetic complications  Airway Mallampati: II  TM Distance: >3 FB Neck ROM: Full    Dental  (+) Dental Advisory Given, Teeth Intact   Pulmonary    Pulmonary exam normal breath sounds clear to auscultation       Cardiovascular negative cardio ROS Normal cardiovascular exam Rhythm:Regular Rate:Normal     Neuro/Psych  Headaches, PSYCHIATRIC DISORDERS Anxiety Depression  Neuromuscular disease    GI/Hepatic negative GI ROS, Neg liver ROS,   Endo/Other  Hypothyroidism (not on meds, stopped taking meds 2 years ago)   Renal/GU negative Renal ROS     Musculoskeletal negative musculoskeletal ROS (+)   Abdominal   Peds  Hematology negative hematology ROS (+)   Anesthesia Other Findings   Reproductive/Obstetrics negative OB ROS                            Anesthesia Physical Anesthesia Plan  ASA: 2  Anesthesia Plan: General   Post-op Pain Management:    Induction: Intravenous  PONV Risk Score and Plan: TIVA  Airway Management Planned: Nasal Cannula and Natural Airway  Additional Equipment:   Intra-op Plan:   Post-operative Plan:   Informed Consent: I have reviewed the patients History and Physical, chart, labs and discussed the procedure including the risks, benefits and alternatives for the proposed anesthesia with the patient or authorized representative who has indicated his/her understanding and acceptance.     Dental advisory given  Plan Discussed with: CRNA and Surgeon  Anesthesia Plan Comments:         Anesthesia Quick Evaluation

## 2020-11-27 NOTE — Anesthesia Postprocedure Evaluation (Signed)
Anesthesia Post Note  Patient: Barbara [name]  Procedure(s) Performed: COLONOSCOPY WITH PROPOFOL  Patient location during evaluation: Endoscopy Anesthesia Type: General Level of consciousness: awake and alert and oriented Pain management: pain level controlled Vital Signs Assessment: post-procedure vital signs reviewed and stable Respiratory status: spontaneous breathing and respiratory function stable Cardiovascular status: blood pressure returned to baseline and stable Postop Assessment: no apparent nausea or vomiting Anesthetic complications: no   No notable events documented.   Last Vitals:  Vitals:   11/27/20 0812 11/27/20 0943  BP: 122/82 94/66  Pulse: 78 73  Resp: 18 20  Temp: 36.9 C 36.4 C  SpO2: 99% 98%    Last Pain:  Vitals:   11/27/20 0943  TempSrc: Oral  PainSc: 0-No pain                 Adreana Coull C Denay Pleitez

## 2020-11-27 NOTE — Discharge Instructions (Addendum)

## 2020-11-30 ENCOUNTER — Encounter (HOSPITAL_COMMUNITY): Payer: Self-pay | Admitting: Internal Medicine

## 2021-06-24 ENCOUNTER — Encounter: Payer: Self-pay | Admitting: Family Medicine

## 2021-06-24 MED ORDER — ESCITALOPRAM OXALATE 10 MG PO TABS: 10.0000 mg | ORAL_TABLET | Freq: Every day | ORAL | 1 refills | Status: DC

## 2021-07-19 ENCOUNTER — Encounter (HOSPITAL_COMMUNITY): Payer: Self-pay | Admitting: *Deleted

## 2021-07-24 ENCOUNTER — Ambulatory Visit (INDEPENDENT_AMBULATORY_CARE_PROVIDER_SITE_OTHER): Payer: BC Managed Care – PPO | Admitting: Family Medicine

## 2021-07-24 ENCOUNTER — Encounter: Payer: Self-pay | Admitting: Family Medicine

## 2021-07-24 VITALS — BP 113/81 | HR 72 | Temp 98.4°F | Ht 66.5 in | Wt 217.1 lb

## 2021-07-24 DIAGNOSIS — E66811 Obesity, class 1: Secondary | ICD-10-CM

## 2021-07-24 DIAGNOSIS — G43909 Migraine, unspecified, not intractable, without status migrainosus: Secondary | ICD-10-CM | POA: Diagnosis not present

## 2021-07-24 DIAGNOSIS — E669 Obesity, unspecified: Secondary | ICD-10-CM

## 2021-07-24 DIAGNOSIS — F339 Major depressive disorder, recurrent, unspecified: Secondary | ICD-10-CM | POA: Diagnosis not present

## 2021-07-24 DIAGNOSIS — Z7689 Persons encountering health services in other specified circumstances: Secondary | ICD-10-CM

## 2021-07-24 DIAGNOSIS — F411 Generalized anxiety disorder: Secondary | ICD-10-CM

## 2021-07-24 DIAGNOSIS — Z9884 Bariatric surgery status: Secondary | ICD-10-CM

## 2021-07-24 MED ORDER — ELETRIPTAN HYDROBROMIDE 40 MG PO TABS: 40.0000 mg | ORAL_TABLET | ORAL | 5 refills | Status: DC | PRN

## 2021-07-24 MED ORDER — ONDANSETRON HCL 4 MG PO TABS: 4.0000 mg | ORAL_TABLET | Freq: Three times a day (TID) | ORAL | 0 refills | Status: DC | PRN

## 2021-07-24 NOTE — Progress Notes (Signed)
New Patient Office Visit  Subjective    Patient ID: Barbara [name], female    DOB: 1975/09/25  Age: 46 y.o. MRN: [id number]  CC:  Chief Complaint  Patient presents with   New Patient (Initial Visit)    HPI Barbara [name] presents to establish care.  She has a history of anxiety and depression. She takes lexapro for this with good relief. She does have xanax on hand that she uses very rarely. The prescription from her previous PCP was from over a year ago and she still has some left.   She has a had a gastric sleeve. She has gained some of the weight back since the surgery but has maintained a good portion of the weight loss. She notes that she is a stress eater. She is working to get some more weight off.   She also has a history of migraines. She denies any aura. Headaches are currently occurring about 1x a month. They are unilateral and associated with nausea. She used to take relpak prn with good relief. Currently is getting relief from tylenol and sleep. She would like to have something on hand to take if tylenol and sleep is not effective.      07/24/2021    4:09 PM 09/27/2020    9:11 AM 01/05/2020    3:40 PM  Depression screen PHQ 2/9  Decreased Interest 0 0 0  Down, Depressed, Hopeless 1 0 0  PHQ - 2 Score 1 0 0  Altered sleeping 1  0  Tired, decreased energy 0  0  Change in appetite 1  0  Feeling bad or failure about yourself  0  0  Trouble concentrating 1  0  Moving slowly or fidgety/restless 0  0  Suicidal thoughts 0  0  PHQ-9 Score 4  0  Difficult doing work/chores Not difficult at all        07/24/2021    4:10 PM 01/05/2020    3:41 PM 10/21/2019   11:26 AM 09/06/2019    8:34 AM  GAD 7 : Generalized Anxiety Score  Nervous, Anxious, on Edge 1 0 2 0  Control/stop worrying 0 0 3 0  Worry too much - different things 0 0 3 0  Trouble relaxing 1 0 2 0  Restless 0 0 1 0  Easily annoyed or irritable 0 0 3 0  Afraid - awful might happen 1 0 2 0  Total GAD 7 Score  3 0 16 0  Anxiety Difficulty Not difficult at all  Somewhat difficult       Outpatient Encounter Medications as of 07/24/2021  Medication Sig   ALPRAZolam (XANAX) 0.5 MG tablet Take 0.25 mg by mouth daily as needed for anxiety.   Calcium-Phosphorus-Vitamin D (CALCIUM/D3 ADULT GUMMIES PO) Take 1 tablet by mouth 3 (three) times daily.   cetirizine (ZYRTEC) 10 MG tablet Take 10 mg by mouth daily.   escitalopram (LEXAPRO) 10 MG tablet Take 1 tablet (10 mg total) by mouth daily.   Multiple Vitamins-Minerals (BARIATRIC FUSION) CHEW Chew 1 tablet by mouth in the morning. NO IRON   No facility-administered encounter medications on file as of 07/24/2021.    Past Medical History:  Diagnosis Date   Anxiety    Depression    History of palpitations 2011   work-up with cardiologist with event monitor, showed no arrhythmias   History of panic attacks    Migraine headache    Morbid obesity (HCC)    PONV (postoperative  nausea and vomiting)     Past Surgical History:  Procedure Laterality Date   COLONOSCOPY WITH PROPOFOL N/A 11/27/2020   Procedure: COLONOSCOPY WITH PROPOFOL;  Surgeon: Lanelle Bal, DO;  Location: [facility];  Service: Endoscopy;  Laterality: N/A;  9:30 / ASA II   D & C HYSTEROSCOPY W/ ENDOMETRIAL ABLATION  01-29-2005   dr Despina Hidden  @[facility]    ELBOW SURGERY Left    ESOPHAGOGASTRODUODENOSCOPY N/A 05/18/2014   SLF: 1. Mild esophaigitis. 2. Mild non-erosive gastritis.    LAPAROSCOPIC GASTRIC SLEEVE RESECTION N/A 12/22/2017   Procedure: LAPAROSCOPIC GASTRIC SLEEVE RESECTION, UPPER ENDO, ERAS PATHWAY;  Surgeon: 13/06/2017, MD;  Location: [facility];  Service: General;  Laterality: N/A;   ORIF RADIUS & ULNA FRACTURES Left 11-01-2006   dr 11-03-2006 @[facility]    ULNAR NERVE TRANSPOSITION Left 2011   AND REMOVAL HARDWARE OF WRIST    Family History  Problem Relation Age of Onset   Arthritis Mother    Depression Father    Arthritis Father    Anxiety disorder Father    Hypertension Father     Hyperlipidemia Father    Diabetes Father    Crohn's disease Daughter    Heart disease Paternal Grandmother    Arthritis Paternal Grandmother    Diabetes Paternal Grandmother    Hyperlipidemia Paternal Grandmother    Hypertension Paternal Grandmother    Heart attack Paternal Grandmother    Hypertension Paternal Grandfather    Arthritis Paternal Grandfather    Colon cancer Neg Hx     Social History   Socioeconomic History   Marital status: Married    Spouse name:   Number of children: 2   Years of education: 14   Highest education level: Some college, no degree  Occupational History   Not on file  Tobacco Use   Smoking status: Never   Smokeless tobacco: Never  Vaping Use   Vaping Use: Never used  Substance and Sexual Activity   Alcohol use: No   Drug use: No   Sexual activity: Yes    Birth control/protection: Other-see comments    Comment: vasectomy; ablation  Other Topics Concern   Not on file  Social History Narrative   Not on file   Social Determinants of Health   Financial Resource Strain: Not on file  Food Insecurity: Not on file  Transportation Needs: Not on file  Physical Activity: Not on file  Stress: Not on file  Social Connections: Not on file  Intimate Partner Violence: Not on file    ROS Negative unless specially indicated above in HPI.    Objective    BP 113/81   Pulse 72   Temp 98.4 F (36.9 C) (Temporal)   Ht 5' 6.5" (1.689 m)   Wt 217 lb 2 oz (98.5 kg)   SpO2 97%   BMI 34.52 kg/m   Physical Exam Vitals and nursing note reviewed.  Constitutional:      General: She is not in acute distress.    Appearance: She is not ill-appearing, toxic-appearing or diaphoretic.  HENT:     Head: Normocephalic and atraumatic.     Mouth/Throat:     Mouth: Mucous membranes are moist.     Pharynx: Oropharynx is clear.  Neck:     Thyroid: No thyroid mass, thyromegaly or thyroid tenderness.     Vascular: No carotid bruit.   Cardiovascular:     Rate and Rhythm: Normal rate and regular rhythm.     Heart sounds:  Normal heart sounds. No murmur heard. Pulmonary:     Effort: Pulmonary effort is normal.     Breath sounds: Normal breath sounds.  Abdominal:     General: Bowel sounds are normal. There is no distension.     Palpations: Abdomen is soft.     Tenderness: There is no abdominal tenderness. There is no guarding or rebound.  Musculoskeletal:     Right lower leg: No edema.     Left lower leg: No edema.  Skin:    General: Skin is warm and dry.  Neurological:     General: No focal deficit present.     Mental Status: She is alert and oriented to person, place, and time.  Psychiatric:        Mood and Affect: Mood normal.        Behavior: Behavior normal.        Assessment & Plan:   Barbara [name] was seen today for new patient (initial visit).  Diagnoses and all orders for this visit:  Migraine without status migrainosus, not intractable, unspecified migraine type Currently once monthly. Relpax and zofran prn. Stress reduction.  -     eletriptan (RELPAX) 40 MG tablet; Take 1 tablet (40 mg total) by mouth as needed for migraine or headache. May repeat in 2 hours if headache persists or recurs. -     ondansetron (ZOFRAN) 4 MG tablet; Take 1 tablet (4 mg total) by mouth every 8 (eight) hours as needed for nausea or vomiting.  Depression, recurrent (HCC) GAD (generalized anxiety disorder) Well controlled on current regimen. Continue lexapro. Takes xanax very rarely, discussed will refill when needed with office visit.   Obesity (BMI 30.0-34.9) Bariatric surgery status Gastric sleeve. Diet and exercise.    Encounter to establish care  Return for schedule PCP with pap, mammo on bus.   The patient indicates understanding of these issues and agrees with the plan.  Barbara Earingiffany M Kimberlyann Hollar, FNP

## 2021-07-24 NOTE — Patient Instructions (Signed)
Migraine Headache A migraine headache is an intense, throbbing pain on one side or both sides of the head. Migraine headaches may also cause other symptoms, such as nausea, vomiting, and sensitivity to light and noise. A migraine headache can last from 4 hours to 3 days. Talk with your doctor about what things may bring on (trigger) your migraine headaches. What are the causes? The exact cause of this condition is not known. However, a migraine may be caused when nerves in the brain become irritated and release chemicals that cause inflammation of blood vessels. This inflammation causes pain. This condition may be triggered or caused by: Drinking alcohol. Smoking. Taking medicines, such as: Medicine used to treat chest pain (nitroglycerin). Birth control pills. Estrogen. Certain blood pressure medicines. Eating or drinking products that contain nitrates, glutamate, aspartame, or tyramine. Aged cheeses, chocolate, or caffeine may also be triggers. Doing physical activity. Other things that may trigger a migraine headache include: Menstruation. Pregnancy. Hunger. Stress. Lack of sleep or too much sleep. Weather changes. Fatigue. What increases the risk? The following factors may make you more likely to experience migraine headaches: Being a certain age. This condition is more common in people who are 25-55 years old. Being female. Having a family history of migraine headaches. Being Caucasian. Having a mental health condition, such as depression or anxiety. Being obese. What are the signs or symptoms? The main symptom of this condition is pulsating or throbbing pain. This pain may: Happen in any area of the head, such as on one side or both sides. Interfere with daily activities. Get worse with physical activity. Get worse with exposure to bright lights or loud noises. Other symptoms may include: Nausea. Vomiting. Dizziness. General sensitivity to bright lights, loud noises, or  smells. Before you get a migraine headache, you may get warning signs (an aura). An aura may include: Seeing flashing lights or having blind spots. Seeing bright spots, halos, or zigzag lines. Having tunnel vision or blurred vision. Having numbness or a tingling feeling. Having trouble talking. Having muscle weakness. Some people have symptoms after a migraine headache (postdromal phase), such as: Feeling tired. Difficulty concentrating. How is this diagnosed? A migraine headache can be diagnosed based on: Your symptoms. A physical exam. Tests, such as: CT scan or an MRI of the head. These imaging tests can help rule out other causes of headaches. Taking fluid from the spine (lumbar puncture) and analyzing it (cerebrospinal fluid analysis, or CSF analysis). How is this treated? This condition may be treated with medicines that: Relieve pain. Relieve nausea. Prevent migraine headaches. Treatment for this condition may also include: Acupuncture. Lifestyle changes like avoiding foods that trigger migraine headaches. Biofeedback. Cognitive behavioral therapy. Follow these instructions at home: Medicines Take over-the-counter and prescription medicines only as told by your health care provider. Ask your health care provider if the medicine prescribed to you: Requires you to avoid driving or using heavy machinery. Can cause constipation. You may need to take these actions to prevent or treat constipation: Drink enough fluid to keep your urine pale yellow. Take over-the-counter or prescription medicines. Eat foods that are high in fiber, such as beans, whole grains, and fresh fruits and vegetables. Limit foods that are high in fat and processed sugars, such as fried or sweet foods. Lifestyle Do not drink alcohol. Do not use any products that contain nicotine or tobacco, such as cigarettes, e-cigarettes, and chewing tobacco. If you need help quitting, ask your health care  provider. Get at least 8   hours of sleep every night. Find ways to manage stress, such as meditation, deep breathing, or yoga. General instructions     Keep a journal to find out what may trigger your migraine headaches. For example, write down: What you eat and drink. How much sleep you get. Any change to your diet or medicines. If you have a migraine headache: Avoid things that make your symptoms worse, such as bright lights. It may help to lie down in a dark, quiet room. Do not drive or use heavy machinery. Ask your health care provider what activities are safe for you while you are experiencing symptoms. Keep all follow-up visits as told by your health care provider. This is important. Contact a health care provider if: You develop symptoms that are different or more severe than your usual migraine headache symptoms. You have more than 15 headache days in one month. Get help right away if: Your migraine headache becomes severe. Your migraine headache lasts longer than 72 hours. You have a fever. You have a stiff neck. You have vision loss. Your muscles feel weak or like you cannot control them. You start to lose your balance often. You have trouble walking. You faint. You have a seizure. Summary A migraine headache is an intense, throbbing pain on one side or both sides of the head. Migraines may also cause other symptoms, such as nausea, vomiting, and sensitivity to light and noise. This condition may be treated with medicines and lifestyle changes. You may also need to avoid certain things that trigger a migraine headache. Keep a journal to find out what may trigger your migraine headaches. Contact your health care provider if you have more than 15 headache days in a month or you develop symptoms that are different or more severe than your usual migraine headache symptoms. This information is not intended to replace advice given to you by your health care provider. Make sure  you discuss any questions you have with your health care provider. Document Revised: 05/28/2018 Document Reviewed: 03/18/2018 Elsevier Patient Education  2023 [employer]  

## 2021-08-01 ENCOUNTER — Encounter: Payer: Self-pay | Admitting: Family Medicine

## 2021-08-01 ENCOUNTER — Ambulatory Visit (INDEPENDENT_AMBULATORY_CARE_PROVIDER_SITE_OTHER): Payer: BC Managed Care – PPO | Admitting: Family Medicine

## 2021-08-01 VITALS — BP 119/76 | HR 78 | Temp 98.3°F | Ht 66.5 in | Wt 219.4 lb

## 2021-08-01 DIAGNOSIS — J029 Acute pharyngitis, unspecified: Secondary | ICD-10-CM | POA: Diagnosis not present

## 2021-08-01 LAB — CULTURE, GROUP A STREP

## 2021-08-01 LAB — RAPID STREP SCREEN (MED CTR MEBANE ONLY): Strep Gp A Ag, IA W/Reflex: NEGATIVE

## 2021-08-01 MED ORDER — CEPHALEXIN 500 MG PO CAPS: 500.0000 mg | ORAL_CAPSULE | Freq: Two times a day (BID) | ORAL | 0 refills | Status: AC

## 2021-08-01 NOTE — Progress Notes (Signed)
Established Patient Office Visit  Subjective   Patient ID: Barbara [name], female    DOB: 01-22-1976  Age: 46 y.o. MRN: [id number]  Chief Complaint  Patient presents with   Sore Throat   Otalgia    Sore Throat  This is a new problem. Episode onset: 4 days ago. The problem has been gradually worsening. There has been no fever. The pain is moderate. Associated symptoms include headaches, neck pain and swollen glands. Pertinent negatives include no abdominal pain, congestion, coughing, diarrhea, drooling, ear discharge, ear pain, hoarse voice, shortness of breath, stridor, trouble swallowing or vomiting. She has had no exposure to strep. She has tried cool liquids (antihistamine) for the symptoms. The treatment provided no relief.  Otalgia  There is pain in both (below ears extending into neck) ears. This is a new problem. The current episode started in the past 7 days. There has been no fever. Associated symptoms include headaches, neck pain and a sore throat. Pertinent negatives include no abdominal pain, coughing, diarrhea, ear discharge, hearing loss, rash, rhinorrhea or vomiting. There is no history of a chronic ear infection, hearing loss or a tympanostomy tube.    Patient Active Problem List   Diagnosis Date Noted   Depression, recurrent (HCC) 07/24/2021   Well adult exam 09/06/2019   Bariatric surgery status 11/26/2018   Cubital tunnel syndrome 08/25/2017   Grief 11/25/2016   Hypothyroid 09/02/2016   Screening for colorectal cancer 03/25/2016   Encounter for gynecological examination with Papanicolaou smear of cervix 03/25/2016   Dyspepsia    Unspecified symptom associated with female genital organs 12/22/2012   GAD (generalized anxiety disorder) 10/26/2009   Migraine headache 10/26/2009   PALPITATIONS 10/26/2009      Review of Systems  Constitutional:  Negative for chills, fever and weight loss.  HENT:  Positive for sore throat. Negative for congestion, drooling, ear  discharge, ear pain, hearing loss, hoarse voice, nosebleeds, rhinorrhea, tinnitus and trouble swallowing.   Eyes:  Negative for blurred vision, double vision, pain and discharge.  Respiratory:  Negative for cough, shortness of breath, wheezing and stridor.   Cardiovascular:  Negative for chest pain, palpitations and leg swelling.  Gastrointestinal:  Negative for abdominal pain, constipation, diarrhea, heartburn and vomiting.  Genitourinary:  Negative for dysuria, frequency and urgency.  Musculoskeletal:  Positive for neck pain. Negative for back pain, joint pain and myalgias.  Skin:  Negative for itching and rash.  Neurological:  Positive for headaches. Negative for dizziness, sensory change, speech change and focal weakness.  Endo/Heme/Allergies:  Does not bruise/bleed easily.  Psychiatric/Behavioral:  Negative for depression, hallucinations, substance abuse and suicidal ideas.       Objective:     BP 119/76   Pulse 78   Temp 98.3 F (36.8 C) (Temporal)   Ht 5' 6.5" (1.689 m)   Wt 99.5 kg   SpO2 98%   BMI 34.88 kg/m    Physical Exam Constitutional:      Appearance: She is well-developed. She is obese.  HENT:     Head: Normocephalic and atraumatic.     Right Ear: Tympanic membrane, ear canal and external ear normal.     Left Ear: Tympanic membrane, ear canal and external ear normal.     Nose: No rhinorrhea.     Right Sinus: No maxillary sinus tenderness or frontal sinus tenderness.     Left Sinus: No maxillary sinus tenderness or frontal sinus tenderness.     Mouth/Throat:     Mouth: Mucous  membranes are moist.     Pharynx: Posterior oropharyngeal erythema present. No oropharyngeal exudate.     Tonsils: No tonsillar exudate or tonsillar abscesses. 1+ on the right. 1+ on the left.  Eyes:     Extraocular Movements: Extraocular movements intact.     Conjunctiva/sclera: Conjunctivae normal.  Cardiovascular:     Rate and Rhythm: Normal rate and regular rhythm.     Pulses:  Normal pulses.     Heart sounds: Normal heart sounds.  Pulmonary:     Effort: Pulmonary effort is normal.     Breath sounds: Normal breath sounds.  Abdominal:     General: Bowel sounds are normal.     Palpations: Abdomen is soft.  Musculoskeletal:     Cervical back: Normal range of motion.  Lymphadenopathy:     Cervical: Cervical adenopathy present.  Skin:    General: Skin is warm and dry.     Capillary Refill: Capillary refill takes less than 2 seconds.  Neurological:     Mental Status: She is alert and oriented to person, place, and time.  Psychiatric:        Mood and Affect: Mood normal.        Behavior: Behavior normal.        Thought Content: Thought content normal.        Judgment: Judgment normal.      No results found for any visits on 08/01/21.    The 10-year ASCVD risk score (Arnett DK, et al., 2019) is: 0.6%    Assessment & Plan:   Barbara [name] was seen today for sore throat and otalgia.  Diagnoses and all orders for this visit:  Pharyngitis, unspecified etiology Negative rapid strep. Will treat with keflex as below given significant cervical adenopathy and lack of cough and congestion. Discussed symptomatic care.  -     Rapid Strep Screen ([facility] ONLY) -     cephALEXin (KEFLEX) 500 MG capsule; Take 1 capsule (500 mg total) by mouth 2 (two) times daily for 10 days.   Kary Kos, RN  I personally was present during the history, physical exam, and medical decision-making activities of this service and have verified that the service and findings are accurately documented in the nurse practitioner student's note.  Harlow Mares, FNP

## 2021-08-19 ENCOUNTER — Other Ambulatory Visit: Payer: Self-pay | Admitting: Family Medicine

## 2021-09-13 ENCOUNTER — Encounter: Payer: Self-pay | Admitting: Family Medicine

## 2021-09-14 ENCOUNTER — Other Ambulatory Visit: Payer: Self-pay | Admitting: Family Medicine

## 2021-09-26 ENCOUNTER — Encounter: Payer: Self-pay | Admitting: Family Medicine

## 2021-09-26 ENCOUNTER — Ambulatory Visit (INDEPENDENT_AMBULATORY_CARE_PROVIDER_SITE_OTHER): Payer: BC Managed Care – PPO | Admitting: Family Medicine

## 2021-09-26 VITALS — BP 125/80 | HR 70 | Temp 98.1°F | Ht 66.5 in | Wt 217.5 lb

## 2021-09-26 DIAGNOSIS — E669 Obesity, unspecified: Secondary | ICD-10-CM | POA: Diagnosis not present

## 2021-09-26 DIAGNOSIS — Z0001 Encounter for general adult medical examination with abnormal findings: Secondary | ICD-10-CM

## 2021-09-26 DIAGNOSIS — Z9884 Bariatric surgery status: Secondary | ICD-10-CM

## 2021-09-26 DIAGNOSIS — Z0000 Encounter for general adult medical examination without abnormal findings: Secondary | ICD-10-CM | POA: Diagnosis not present

## 2021-09-26 LAB — BAYER DCA HB A1C WAIVED: HB A1C (BAYER DCA - WAIVED): 4.9 % (ref 4.8–5.6)

## 2021-09-26 NOTE — Progress Notes (Signed)
Complete physical exam  Patient: Barbara [name]   DOB: 30-Sep-1975   46 y.o. Female  MRN: [id number]  Subjective:    Chief Complaint  Patient presents with   Annual Exam    Barbara [name] is a 46 y.o. female who presents today for a complete physical exam. She reports consuming a  healthy diet. Home exercise routine includes walking 0.5 hrs per week. She generally feels well. She reports sleeping well. She does not have additional problems to discuss today.      09/26/2021   11:07 AM 08/01/2021    9:06 AM 07/24/2021    4:09 PM  Depression screen PHQ 2/9  Decreased Interest 0 0 0  Down, Depressed, Hopeless 1 0 1  PHQ - 2 Score 1 0 1  Altered sleeping 0 1 1  Tired, decreased energy 1 0 0  Change in appetite 0 0 1  Feeling bad or failure about yourself  0 0 0  Trouble concentrating 0 1 1  Moving slowly or fidgety/restless 0 0 0  Suicidal thoughts 0 0 0  PHQ-9 Score 2 2 4   Difficult doing work/chores Not difficult at all Not difficult at all Not difficult at all      09/26/2021   11:08 AM 08/01/2021    9:06 AM 07/24/2021    4:10 PM 01/05/2020    3:41 PM  GAD 7 : Generalized Anxiety Score  Nervous, Anxious, on Edge 1 1 1  0  Control/stop worrying 1 0 0 0  Worry too much - different things 1 0 0 0  Trouble relaxing 1 0 1 0  Restless 0 0 0 0  Easily annoyed or irritable 1 0 0 0  Afraid - awful might happen 1 0 1 0  Total GAD 7 Score 6 1 3  0  Anxiety Difficulty Not difficult at all Not difficult at all Not difficult at all     Most recent fall risk assessment:    09/26/2021   11:07 AM  [place] in the past year? 0     Most recent depression screenings:    09/26/2021   11:07 AM 08/01/2021    9:06 AM  PHQ 2/9 Scores  PHQ - 2 Score 1 0  PHQ- 9 Score 2 2      Past Medical History:  Diagnosis Date   Anxiety    Depression    History of palpitations 2011   work-up with cardiologist with event monitor, showed no arrhythmias   History of panic attacks     Migraine headache    Morbid obesity (HCC)    PONV (postoperative nausea and vomiting)       Patient Care Team: Gwenlyn Perking, FNP as PCP - General (Family Medicine) Danie Binder, MD (Inactive) as Consulting Physician (Gastroenterology) Florian Buff, MD as Consulting Physician (Obstetrics and Gynecology)   Outpatient Medications Prior to Visit  Medication Sig   ALPRAZolam Duanne Moron) 0.5 MG tablet Take 0.25 mg by mouth daily as needed for anxiety.   Calcium-Phosphorus-Vitamin D (CALCIUM/D3 ADULT GUMMIES PO) Take 1 tablet by mouth 3 (three) times daily.   cetirizine (ZYRTEC) 10 MG tablet Take 10 mg by mouth daily.   eletriptan (RELPAX) 40 MG tablet Take 1 tablet (40 mg total) by mouth as needed for migraine or headache. May repeat in 2 hours if headache persists or recurs.   escitalopram (LEXAPRO) 10 MG tablet TAKE 1 TABLET BY MOUTH EVERY DAY   Multiple Vitamins-Minerals (  BARIATRIC FUSION) CHEW Chew 1 tablet by mouth in the morning. NO IRON   ondansetron (ZOFRAN) 4 MG tablet Take 1 tablet (4 mg total) by mouth every 8 (eight) hours as needed for nausea or vomiting.   No facility-administered medications prior to visit.    ROS        Objective:     BP 125/80   Pulse 70   Temp 98.1 F (36.7 C) (Temporal)   Ht 5' 6.5" (1.689 m)   Wt 217 lb 8 oz (98.7 kg)   SpO2 97%   BMI 34.58 kg/m    Physical Exam Vitals and nursing note reviewed.  Constitutional:      General: She is not in acute distress.    Appearance: Normal appearance. She is not ill-appearing, toxic-appearing or diaphoretic.  HENT:     Head: Normocephalic and atraumatic.     Right Ear: Tympanic membrane, ear canal and external ear normal.     Left Ear: Tympanic membrane, ear canal and external ear normal.     Nose: Nose normal.     Mouth/Throat:     Mouth: Mucous membranes are moist.     Pharynx: Oropharynx is clear.  Eyes:     Extraocular Movements: Extraocular movements intact.      Conjunctiva/sclera: Conjunctivae normal.     Pupils: Pupils are equal, round, and reactive to light.  Neck:     Thyroid: No thyroid mass, thyromegaly or thyroid tenderness.  Cardiovascular:     Rate and Rhythm: Normal rate and regular rhythm.     Pulses: Normal pulses.     Heart sounds: Normal heart sounds. No murmur heard.    No friction rub. No gallop.  Pulmonary:     Effort: Pulmonary effort is normal. No respiratory distress.     Breath sounds: Normal breath sounds.  Abdominal:     General: Bowel sounds are normal. There is no distension.     Palpations: Abdomen is soft. There is no mass.     Tenderness: There is no abdominal tenderness. There is no guarding or rebound.  Musculoskeletal:        General: No swelling. Normal range of motion.     Cervical back: Normal range of motion and neck supple. No tenderness.  Skin:    General: Skin is warm and dry.     Capillary Refill: Capillary refill takes less than 2 seconds.     Findings: No lesion or rash.  Neurological:     General: No focal deficit present.     Mental Status: She is alert and oriented to person, place, and time.     Cranial Nerves: No cranial nerve deficit.     Motor: No weakness.     Gait: Gait normal.  Psychiatric:        Mood and Affect: Mood normal.        Behavior: Behavior normal.        Thought Content: Thought content normal.        Judgment: Judgment normal.      No results found for any visits on 09/26/21.     Assessment & Plan:    Routine Health Maintenance and Physical Exam Weslynn was seen today for annual exam.  Diagnoses and all orders for this visit:  Routine general medical examination at a health care facility Fasting labs pending.  -     CBC with Differential/Platelet -     CMP14+EGFR -     Lipid panel -  TSH -     Bayer DCA Hb A1c Waived  Obesity (BMI 30.0-34.9) Bariatric surgery status Continue diet and exercise. Fasting labs pending.  -     CBC with  Differential/Platelet -     CMP14+EGFR -     Lipid panel -     TSH -     Bayer DCA Hb A1c Waived  Immunization History  Administered Date(s) Administered   Influenza,inj,Quad PF,6+ Mos 01/16/2018, 11/26/2018   Influenza-Unspecified 01/16/2018   Tdap 11/26/2018    Health Maintenance  Topic Date Due   MAMMOGRAM  01/30/2021   INFLUENZA VACCINE  05/18/2022 (Originally 09/17/2021)   Hepatitis C Screening  07/25/2022 (Originally 12/17/1991)   HIV Screening  07/25/2022 (Originally 12/16/1988)   COVID-19 Vaccine (1) 08/10/2022 (Originally [birth year]1974)   PAP SMEAR-Modifier  01/05/2022   TETANUS/TDAP  11/25/2028   COLONOSCOPY (Pts 45-50yr Insurance coverage will need to be confirmed)  11/28/2030   HPV VACCINES  Aged Out    Discussed health benefits of physical activity, and encouraged her to engage in regular exercise appropriate for her age and condition.  Problem List Items Addressed This Visit   None Visit Diagnoses     Routine general medical examination at a health care facility    -  Primary      Return in 1 year (on 09/27/2022).   The patient indicates understanding of these issues and agrees with the plan.   TGwenlyn Perking FNP

## 2021-09-26 NOTE — Patient Instructions (Signed)

## 2021-09-27 ENCOUNTER — Encounter: Payer: Self-pay | Admitting: Family Medicine

## 2021-09-27 LAB — CBC WITH DIFFERENTIAL/PLATELET
Basophils Absolute: 0.1 10*3/uL (ref 0.0–0.2)
Basos: 1 %
EOS (ABSOLUTE): 0.2 10*3/uL (ref 0.0–0.4)
Eos: 2 %
Hematocrit: 45.9 % (ref 34.0–46.6)
Hemoglobin: 15.3 g/dL (ref 11.1–15.9)
Immature Grans (Abs): 0 10*3/uL (ref 0.0–0.1)
Immature Granulocytes: 0 %
Lymphocytes Absolute: 2.5 10*3/uL (ref 0.7–3.1)
Lymphs: 34 %
MCH: 30.3 pg (ref 26.6–33.0)
MCHC: 33.3 g/dL (ref 31.5–35.7)
MCV: 91 fL (ref 79–97)
Monocytes Absolute: 0.5 10*3/uL (ref 0.1–0.9)
Monocytes: 6 %
Neutrophils Absolute: 4.2 10*3/uL (ref 1.4–7.0)
Neutrophils: 57 %
Platelets: 328 10*3/uL (ref 150–450)
RBC: 5.05 x10E6/uL (ref 3.77–5.28)
RDW: 12.2 % (ref 11.7–15.4)
WBC: 7.4 10*3/uL (ref 3.4–10.8)

## 2021-09-27 LAB — CMP14+EGFR
ALT: 17 IU/L (ref 0–32)
AST: 22 IU/L (ref 0–40)
Albumin/Globulin Ratio: 1.9 (ref 1.2–2.2)
Albumin: 4.4 g/dL (ref 3.9–4.9)
Alkaline Phosphatase: 66 IU/L (ref 44–121)
BUN/Creatinine Ratio: 15 (ref 9–23)
BUN: 13 mg/dL (ref 6–24)
Bilirubin Total: 0.7 mg/dL (ref 0.0–1.2)
CO2: 27 mmol/L (ref 20–29)
Calcium: 10 mg/dL (ref 8.7–10.2)
Chloride: 103 mmol/L (ref 96–106)
Creatinine, Ser: 0.88 mg/dL (ref 0.57–1.00)
Globulin, Total: 2.3 g/dL (ref 1.5–4.5)
Glucose: 80 mg/dL (ref 70–99)
Potassium: 5 mmol/L (ref 3.5–5.2)
Sodium: 141 mmol/L (ref 134–144)
Total Protein: 6.7 g/dL (ref 6.0–8.5)
eGFR: 82 mL/min/{1.73_m2} (ref >59)

## 2021-09-27 LAB — LIPID PANEL
Chol/HDL Ratio: 3.1 ratio (ref 0.0–4.4)
Cholesterol, Total: 200 mg/dL — ABNORMAL HIGH (ref 100–199)
HDL: 65 mg/dL (ref >39)
LDL Chol Calc (NIH): 117 mg/dL — ABNORMAL HIGH (ref 0–99)
Triglycerides: 104 mg/dL (ref 0–149)
VLDL Cholesterol Cal: 18 mg/dL (ref 5–40)

## 2021-09-27 LAB — TSH: TSH: 2.28 u[IU]/mL (ref 0.450–4.500)

## 2021-10-14 ENCOUNTER — Encounter: Payer: BC Managed Care – PPO | Admitting: Family Medicine

## 2021-11-29 ENCOUNTER — Other Ambulatory Visit: Payer: Self-pay | Admitting: Family Medicine

## 2022-02-11 ENCOUNTER — Ambulatory Visit
Admission: EM | Admit: 2022-02-11 | Discharge: 2022-02-11 | Disposition: A | Discharge status: Home / Self Care | Payer: BC Managed Care – PPO | Attending: Family Medicine | Admitting: Family Medicine

## 2022-02-11 DIAGNOSIS — J22 Unspecified acute lower respiratory infection: Secondary | ICD-10-CM | POA: Diagnosis not present

## 2022-02-11 MED ORDER — PROMETHAZINE-DM 6.25-15 MG/5ML PO SYRP: 5.0000 mL | ORAL_SOLUTION | Freq: Four times a day (QID) | ORAL | 0 refills | Status: DC | PRN

## 2022-02-11 MED ORDER — BENZONATATE 100 MG PO CAPS: 100.0000 mg | ORAL_CAPSULE | Freq: Three times a day (TID) | ORAL | 0 refills | Status: DC

## 2022-02-11 MED ORDER — AZITHROMYCIN 250 MG PO TABS: ORAL_TABLET | ORAL | 0 refills | Status: DC

## 2022-02-11 MED ORDER — ALBUTEROL SULFATE HFA 108 (90 BASE) MCG/ACT IN AERS: 2.0000 | INHALATION_SPRAY | RESPIRATORY_TRACT | 0 refills | Status: DC | PRN

## 2022-02-11 NOTE — ED Triage Notes (Signed)
Pt had positive home COVID test 12/06.  Has ongoing cough that is preventing her from sleeping. Cough productive for "creamy" mucous. Has been using nettie pot. Denis body aches, fever. A little SOB.

## 2022-02-11 NOTE — ED Provider Notes (Signed)
[facility]    CSN: [id number] Arrival date & time: 02/11/22  1725      History   Chief Complaint Chief Complaint  Patient presents with   Cough    HPI Barbara [name] is a 46 y.o. female.   Presenting today with 3-week history of ongoing productive cough, occasional episodes of shortness of breath, chest tightness, wheezing.  States she was diagnosed with COVID on 01/22/2022, resolved from this overall but still significant coughing.  Denies fever, chills, chest pain, abdominal pain, nausea vomiting or diarrhea.  Has been trying Mucinex with minimal relief.  Denies known history of chronic pulmonary disease.    Past Medical History:  Diagnosis Date   Anxiety    Depression    History of palpitations 2011   work-up with cardiologist with event monitor, showed no arrhythmias   History of panic attacks    Migraine headache    Morbid obesity (HCC)    PONV (postoperative nausea and vomiting)     Patient Active Problem List   Diagnosis Date Noted   Depression, recurrent (HCC) 07/24/2021   Well adult exam 09/06/2019   Bariatric surgery status 11/26/2018   Cubital tunnel syndrome 08/25/2017   Grief 11/25/2016   Hypothyroid 09/02/2016   Screening for colorectal cancer 03/25/2016   Encounter for gynecological examination with Papanicolaou smear of cervix 03/25/2016   Dyspepsia    Unspecified symptom associated with female genital organs 12/22/2012   GAD (generalized anxiety disorder) 10/26/2009   Migraine headache 10/26/2009   PALPITATIONS 10/26/2009    Past Surgical History:  Procedure Laterality Date   COLONOSCOPY WITH PROPOFOL N/A 11/27/2020   Procedure: COLONOSCOPY WITH PROPOFOL;  Surgeon: Lanelle Bal, DO;  Location: [facility];  Service: Endoscopy;  Laterality: N/A;  9:30 / ASA II   D & C HYSTEROSCOPY W/ ENDOMETRIAL ABLATION  01-29-2005   dr Despina Hidden  @[facility]    ELBOW SURGERY Left    ESOPHAGOGASTRODUODENOSCOPY N/A 05/18/2014   SLF: 1. Mild  esophaigitis. 2. Mild non-erosive gastritis.    LAPAROSCOPIC GASTRIC SLEEVE RESECTION N/A 12/22/2017   Procedure: LAPAROSCOPIC GASTRIC SLEEVE RESECTION, UPPER ENDO, ERAS PATHWAY;  Surgeon: 13/06/2017, MD;  Location: [facility];  Service: General;  Laterality: N/A;   ORIF RADIUS & ULNA FRACTURES Left 11-01-2006   dr 11-03-2006 @[facility]    ULNAR NERVE TRANSPOSITION Left 2011   AND REMOVAL HARDWARE OF WRIST    OB History     Gravida  2   Para  2   Term  2   Preterm      AB      Living  2      SAB      IAB      Ectopic      Multiple      Live Births  2            Home Medications    Prior to Admission medications   Medication Sig Start Date End Date Taking? Authorizing Provider  albuterol (VENTOLIN HFA) 108 (90 Base) MCG/ACT inhaler Inhale 2 puffs into the lungs every 4 (four) hours as needed for wheezing or shortness of breath. 02/11/22  Yes 2012, PA-C  ALPRAZolam 02/13/22) 0.5 MG tablet Take 0.25 mg by mouth daily as needed for anxiety.   Yes [provider]  azithromycin (ZITHROMAX) 250 MG tablet Take first 2 tablets together, then 1 every day until finished. 02/11/22  Yes Prudy Feeler, PA-C  benzonatate (TESSALON) 100 MG  capsule Take 1 capsule (100 mg total) by mouth every 8 (eight) hours. 02/11/22  Yes Volney American, PA-C  Calcium-Phosphorus-Vitamin D (CALCIUM/D3 ADULT GUMMIES PO) Take 1 tablet by mouth 3 (three) times daily.   Yes [provider]  cetirizine (ZYRTEC) 10 MG tablet Take 10 mg by mouth daily.   Yes [provider]  eletriptan (RELPAX) 40 MG tablet Take 1 tablet (40 mg total) by mouth as needed for migraine or headache. May repeat in 2 hours if headache persists or recurs. 07/24/21  Yes Gwenlyn Perking, FNP  escitalopram (LEXAPRO) 10 MG tablet TAKE 1 TABLET BY MOUTH EVERY DAY 11/29/21  Yes Gwenlyn Perking, FNP  Multiple Vitamins-Minerals (BARIATRIC FUSION) CHEW Chew 1 tablet by mouth in the  morning. NO IRON   Yes [provider]  ondansetron (ZOFRAN) 4 MG tablet Take 1 tablet (4 mg total) by mouth every 8 (eight) hours as needed for nausea or vomiting. 07/24/21  Yes Gwenlyn Perking, FNP  promethazine-dextromethorphan (PROMETHAZINE-DM) 6.25-15 MG/5ML syrup Take 5 mLs by mouth 4 (four) times daily as needed. 02/11/22  Yes Volney American, PA-C    Family History Family History  Problem Relation Age of Onset   Arthritis Mother    Depression Father    Arthritis Father    Anxiety disorder Father    Hypertension Father    Hyperlipidemia Father    Diabetes Father    Crohn's disease Daughter    Heart disease Paternal Grandmother    Arthritis Paternal Grandmother    Diabetes Paternal Grandmother    Hyperlipidemia Paternal Grandmother    Hypertension Paternal Grandmother    Heart attack Paternal Grandmother    Hypertension Paternal Grandfather    Arthritis Paternal Grandfather    Colon cancer Neg Hx     Social History Social History   Tobacco Use   Smoking status: Never   Smokeless tobacco: Never  Vaping Use   Vaping Use: Never used  Substance Use Topics   Alcohol use: No   Drug use: No     Allergies   Sulfamethoxazole-trimethoprim, Ibuprofen, Penicillins, Prednisone, Tape, and Latex   Review of Systems Review of Systems Per HPI  Physical Exam Triage Vital Signs ED Triage Vitals  Enc Vitals Group     BP 02/11/22 1858 122/86     Pulse Rate 02/11/22 1858 73     Resp 02/11/22 1858 16     Temp 02/11/22 1858 98 F (36.7 C)     Temp Source 02/11/22 1858 Oral     SpO2 02/11/22 1858 97 %     Weight --      Height --      Head Circumference --      Peak Flow --      Pain Score 02/11/22 1909 0     Pain Loc --      Pain Edu? --      Excl. in [place]? --    No data found.  Updated Vital Signs BP 122/86 (BP Location: Right Arm)   Pulse 73   Temp 98 F (36.7 C) (Oral)   Resp 16   SpO2 97%   Visual Acuity Right Eye Distance:   Left Eye  Distance:   Bilateral Distance:    Right Eye Near:   Left Eye Near:    Bilateral Near:     Physical Exam Vitals and nursing note reviewed.  Constitutional:      Appearance: Normal appearance.  HENT:  Head: Atraumatic.     Right Ear: Tympanic membrane and external ear normal.     Left Ear: Tympanic membrane and external ear normal.     Nose: Nose normal. No congestion.     Mouth/Throat:     Mouth: Mucous membranes are moist.     Pharynx: Posterior oropharyngeal erythema present.  Eyes:     Extraocular Movements: Extraocular movements intact.     Conjunctiva/sclera: Conjunctivae normal.  Cardiovascular:     Rate and Rhythm: Normal rate and regular rhythm.     Heart sounds: Normal heart sounds.  Pulmonary:     Effort: Pulmonary effort is normal.     Breath sounds: Normal breath sounds. No wheezing or rales.  Musculoskeletal:        General: Normal range of motion.     Cervical back: Normal range of motion and neck supple.  Skin:    General: Skin is warm and dry.  Neurological:     Mental Status: She is alert and oriented to person, place, and time.  Psychiatric:        Mood and Affect: Mood normal.        Thought Content: Thought content normal.      UC Treatments / Results  Labs (all labs ordered are listed, but only abnormal results are displayed) Labs Reviewed - No data to display  EKG   Radiology No results found.  Procedures Procedures (including critical care time)  Medications Ordered in UC Medications - No data to display  Initial Impression / Assessment and Plan / UC Course  I have reviewed the triage vital signs and the nursing notes.  Pertinent labs & imaging results that were available during my care of the patient were reviewed by me and considered in my medical decision making (see chart for details).     Given duration of cough and worsening course, will cover with Zithromax in addition to Phenergan DM, Tessalon, albuterol.  Discussed  supportive over-the-counter medications and home care.  Return for worsening symptoms.  Final Clinical Impressions(s) / UC Diagnoses   Final diagnoses:  Lower respiratory infection   Discharge Instructions   None    ED Prescriptions     Medication Sig Dispense Auth. Provider   azithromycin (ZITHROMAX) 250 MG tablet Take first 2 tablets together, then 1 every day until finished. 6 tablet Volney American, [place]   promethazine-dextromethorphan (PROMETHAZINE-DM) 6.25-15 MG/5ML syrup Take 5 mLs by mouth 4 (four) times daily as needed. 100 mL Volney American, PA-C   albuterol (VENTOLIN HFA) 108 (90 Base) MCG/ACT inhaler Inhale 2 puffs into the lungs every 4 (four) hours as needed for wheezing or shortness of breath. 18 g Volney American, PA-C   benzonatate (TESSALON) 100 MG capsule Take 1 capsule (100 mg total) by mouth every 8 (eight) hours. 21 capsule Volney American, [place]      PDMP not reviewed this encounter.   Volney American, [place] 02/11/22 1952

## 2022-02-23 ENCOUNTER — Other Ambulatory Visit: Payer: Self-pay | Admitting: Family Medicine

## 2022-05-21 ENCOUNTER — Other Ambulatory Visit: Payer: Self-pay | Admitting: Family Medicine

## 2022-06-12 ENCOUNTER — Ambulatory Visit (INDEPENDENT_AMBULATORY_CARE_PROVIDER_SITE_OTHER): Payer: No Typology Code available for payment source | Admitting: Family Medicine

## 2022-06-12 ENCOUNTER — Encounter: Payer: Self-pay | Admitting: Family Medicine

## 2022-06-12 VITALS — BP 122/86 | HR 56 | Temp 97.9°F | Ht 66.5 in | Wt 228.6 lb

## 2022-06-12 DIAGNOSIS — M25512 Pain in left shoulder: Secondary | ICD-10-CM | POA: Diagnosis not present

## 2022-06-12 DIAGNOSIS — M62838 Other muscle spasm: Secondary | ICD-10-CM

## 2022-06-12 MED ORDER — CYCLOBENZAPRINE HCL 10 MG PO TABS: 10.0000 mg | ORAL_TABLET | Freq: Three times a day (TID) | ORAL | 0 refills | Status: DC | PRN

## 2022-06-12 MED ORDER — METHYLPREDNISOLONE ACETATE 40 MG/ML IJ SUSP
40.0000 mg | Freq: Once | INTRAMUSCULAR | Status: AC
  Administered 2022-06-12: 60 mg via INTRAMUSCULAR

## 2022-06-12 MED ORDER — KETOROLAC TROMETHAMINE 30 MG/ML IJ SOLN
30.0000 mg | Freq: Once | INTRAMUSCULAR | Status: AC
  Administered 2022-06-12: 30 mg via INTRAMUSCULAR

## 2022-06-12 NOTE — Progress Notes (Signed)
Subjective:  Patient ID: Barbara [name], female    DOB: 08/02/1975, 47 y.o.   MRN: [id number]  Patient Care Team: Gabriel Earing, FNP as PCP - General (Family Medicine) West Bali, MD (Inactive) as Consulting Physician (Gastroenterology) Lazaro Arms, MD as Consulting Physician (Obstetrics and Gynecology)   Chief Complaint:  Shoulder Pain (Left shoulder pain after sleeping with arm above head all night on easter morning. )   HPI: Barbara [name] is a 47 y.o. female presenting on 06/12/2022 for Shoulder Pain (Left shoulder pain after sleeping with arm above head all night on easter morning. )   Shoulder Pain  The pain is present in the left shoulder. This is a new problem. Episode onset: 3-4 weeks ago after sleeping with arm above head. The problem occurs constantly. The problem has been gradually worsening. The pain is moderate. Associated symptoms include a limited range of motion, stiffness and tingling. Pertinent negatives include no fever, inability to bear weight, itching, joint locking, joint swelling or numbness. The symptoms are aggravated by activity and lying down. She has tried acetaminophen, heat, cold and rest for the symptoms. The treatment provided no relief.    Relevant past medical, surgical, family, and social history reviewed and updated as indicated.  Allergies and medications reviewed and updated. Data reviewed: Chart in Epic.   Past Medical History:  Diagnosis Date   Anxiety    Depression    History of palpitations 2011   work-up with cardiologist with event monitor, showed no arrhythmias   History of panic attacks    Migraine headache    Morbid obesity    PONV (postoperative nausea and vomiting)     Past Surgical History:  Procedure Laterality Date   COLONOSCOPY WITH PROPOFOL N/A 11/27/2020   Procedure: COLONOSCOPY WITH PROPOFOL;  Surgeon: Lanelle Bal, DO;  Location: [facility];  Service: Endoscopy;  Laterality: N/A;  9:30 / ASA II    D & C HYSTEROSCOPY W/ ENDOMETRIAL ABLATION  01-29-2005   dr Despina Hidden     ELBOW SURGERY Left    ESOPHAGOGASTRODUODENOSCOPY N/A 05/18/2014   SLF: 1. Mild esophaigitis. 2. Mild non-erosive gastritis.    LAPAROSCOPIC GASTRIC SLEEVE RESECTION N/A 12/22/2017   Procedure: LAPAROSCOPIC GASTRIC SLEEVE RESECTION, UPPER ENDO, ERAS PATHWAY;  Surgeon: Berna Bue, MD;  Location: [facility];  Service: General;  Laterality: N/A;   ORIF RADIUS & ULNA FRACTURES Left 11-01-2006   dr Melvyn Novas    ULNAR NERVE TRANSPOSITION Left 2011   AND REMOVAL HARDWARE OF WRIST    Social History   Socioeconomic History   Marital status: Married    Spouse name: Casimiro Needle   Number of children: 2   Years of education: 14   Highest education level: 12th grade  Occupational History   Not on file  Tobacco Use   Smoking status: Never   Smokeless tobacco: Never  Vaping Use   Vaping Use: Never used  Substance and Sexual Activity   Alcohol use: No   Drug use: No   Sexual activity: Yes    Birth control/protection: Other-see comments    Comment: vasectomy; ablation  Other Topics Concern   Not on file  Social History Narrative   Not on file   Social Determinants of Health   Financial Resource Strain: Low Risk  (06/11/2022)   Overall Financial Resource Strain (CARDIA)    Difficulty of Paying Living Expenses: Not very hard  Food Insecurity: No Food Insecurity (06/11/2022)  Hunger Vital Sign    Worried About Running Out of Food in the Last Year: Never true    Ran Out of Food in the Last Year: Never true  Transportation Needs: No Transportation Needs (06/11/2022)   PRAPARE - Administrator, Civil Service (Medical): No    Lack of Transportation (Non-Medical): No  Physical Activity: Insufficiently Active (06/11/2022)   Exercise Vital Sign    Days of Exercise per Week: 3 days    Minutes of Exercise per Session: 20 min  Stress: No Stress Concern Present (06/11/2022)   [employer] of Occupational  Health - Occupational Stress Questionnaire    Feeling of Stress : Only a little  Social Connections: Moderately Integrated (06/11/2022)   Social Connection and Isolation Panel [NHANES]    Frequency of Communication with Friends and Family: More than three times a week    Frequency of Social Gatherings with Friends and Family: Twice a week    Attends Religious Services: More than 4 times per year    Active Member of [employer] or Organizations: No    Attends Banker Meetings: Not on file    Marital Status: Married  Intimate Partner Violence: Not on file    Outpatient Encounter Medications as of 06/12/2022  Medication Sig   ALPRAZolam (XANAX) 0.5 MG tablet Take 0.25 mg by mouth daily as needed for anxiety.   Calcium-Phosphorus-Vitamin D (CALCIUM/D3 ADULT GUMMIES PO) Take 1 tablet by mouth 3 (three) times daily.   cetirizine (ZYRTEC) 10 MG tablet Take 10 mg by mouth daily.   cyclobenzaprine (FLEXERIL) 10 MG tablet Take 1 tablet (10 mg total) by mouth 3 (three) times daily as needed for muscle spasms.   eletriptan (RELPAX) 40 MG tablet Take 1 tablet (40 mg total) by mouth as needed for migraine or headache. May repeat in 2 hours if headache persists or recurs.   escitalopram (LEXAPRO) 10 MG tablet Take 1 tablet (10 mg total) by mouth daily. (NEEDS TO BE SEEN BEFORE NEXT REFILL)   Multiple Vitamins-Minerals (BARIATRIC FUSION) CHEW Chew 1 tablet by mouth in the morning. NO IRON   ondansetron (ZOFRAN) 4 MG tablet Take 1 tablet (4 mg total) by mouth every 8 (eight) hours as needed for nausea or vomiting.   albuterol (VENTOLIN HFA) 108 (90 Base) MCG/ACT inhaler Inhale 2 puffs into the lungs every 4 (four) hours as needed for wheezing or shortness of breath. (Patient not taking: Reported on 06/12/2022)   [DISCONTINUED] azithromycin (ZITHROMAX) 250 MG tablet Take first 2 tablets together, then 1 every day until finished.   [DISCONTINUED] benzonatate (TESSALON) 100 MG capsule Take 1 capsule (100 mg  total) by mouth every 8 (eight) hours.   [DISCONTINUED] promethazine-dextromethorphan (PROMETHAZINE-DM) 6.25-15 MG/5ML syrup Take 5 mLs by mouth 4 (four) times daily as needed.   No facility-administered encounter medications on file as of 06/12/2022.    Allergies  Allergen Reactions   Sulfamethoxazole-Trimethoprim Nausea And Vomiting and Rash   Ibuprofen     Had gastric sleeve   Penicillins Hives    Tolerates cephalosporins. Has patient had a PCN reaction causing immediate rash, facial/tongue/throat swelling, SOB or lightheadedness with hypotension: No Has patient had a PCN reaction causing severe rash involving mucus membranes or skin necrosis: No Has patient had a PCN reaction that required hospitalization: No Has patient had a PCN reaction occurring within the last 10 years: No If all of the above answers are "NO", then may proceed with Cephalosporin use.    Prednisone  Hives   Tape Other (See Comments)    PAPER TAPE OK; RED AND RAW SKIN & SKIN TEARS   Latex Rash    Some gloves cause red and raw skin. Skin peels off.     Review of Systems  Constitutional:  Negative for activity change, appetite change, chills, fatigue and fever.  HENT: Negative.    Eyes: Negative.  Negative for photophobia and visual disturbance.  Respiratory:  Negative for cough, chest tightness and shortness of breath.   Cardiovascular:  Negative for chest pain, palpitations and leg swelling.  Gastrointestinal:  Negative for blood in stool, constipation, diarrhea, nausea and vomiting.  Endocrine: Negative.   Genitourinary:  Negative for dysuria, frequency and urgency.  Musculoskeletal:  Positive for arthralgias, myalgias and stiffness. Negative for back pain, gait problem, joint swelling, neck pain and neck stiffness.  Skin: Negative.  Negative for itching.  Allergic/Immunologic: Negative.   Neurological:  Positive for tingling. Negative for dizziness, weakness, numbness and headaches.  Hematological:  Negative.   Psychiatric/Behavioral:  Negative for confusion, hallucinations, sleep disturbance and suicidal ideas.   All other systems reviewed and are negative.       Objective:  BP 122/86   Pulse (!) 56   Temp 97.9 F (36.6 C) (Temporal)   Ht 5' 6.5" (1.689 m)   Wt 228 lb 9.6 oz (103.7 kg)   SpO2 99%   BMI 36.34 kg/m    Wt Readings from Last 3 Encounters:  06/12/22 228 lb 9.6 oz (103.7 kg)  09/26/21 217 lb 8 oz (98.7 kg)  08/01/21 219 lb 6 oz (99.5 kg)    Physical Exam Vitals and nursing note reviewed.  Constitutional:      General: She is not in acute distress.    Appearance: Normal appearance. She is well-developed and well-groomed. She is not ill-appearing, toxic-appearing or diaphoretic.  HENT:     Head: Normocephalic and atraumatic.     Jaw: There is normal jaw occlusion.     Right Ear: Hearing normal.     Left Ear: Hearing normal.     Nose: Nose normal.     Mouth/Throat:     Lips: Pink.     Mouth: Mucous membranes are moist.     Pharynx: Uvula midline.  Eyes:     General: Lids are normal.     Conjunctiva/sclera: Conjunctivae normal.     Pupils: Pupils are equal, round, and reactive to light.  Neck:     Trachea: Trachea and phonation normal.  Cardiovascular:     Rate and Rhythm: Normal rate and regular rhythm.     Chest Wall: PMI is not displaced.     Pulses: Normal pulses.     Heart sounds: Normal heart sounds. No murmur heard.    No friction rub. No gallop.  Pulmonary:     Effort: Pulmonary effort is normal. No respiratory distress.     Breath sounds: Normal breath sounds. No wheezing.  Abdominal:     General: There is no abdominal bruit.     Palpations: Abdomen is soft. There is no hepatomegaly or splenomegaly.     Hernia: No hernia is present.  Musculoskeletal:     Right shoulder: Normal.     Left shoulder: Tenderness present. No swelling, deformity, effusion, laceration, bony tenderness or crepitus. Decreased range of motion. Normal strength.  Normal pulse.     Left upper arm: Normal.     Cervical back: Normal range of motion and neck supple. Spasms present.  Thoracic back: Normal.       Back:     Right lower leg: No edema.     Left lower leg: No edema.     Comments: Left shoulder: positive Neer and hawkins signs, negative drop arm sign.   Skin:    General: Skin is warm and dry.     Capillary Refill: Capillary refill takes less than 2 seconds.     Coloration: Skin is not cyanotic, jaundiced or pale.     Findings: No rash.  Neurological:     General: No focal deficit present.     Mental Status: She is alert and oriented to person, place, and time.     Sensory: Sensation is intact.     Motor: Motor function is intact.     Coordination: Coordination is intact.     Gait: Gait is intact.     Deep Tendon Reflexes: Reflexes are normal and symmetric.  Psychiatric:        Attention and Perception: Attention and perception normal.        Mood and Affect: Mood and affect normal.        Speech: Speech normal.        Behavior: Behavior normal. Behavior is cooperative.        Thought Content: Thought content normal.        Cognition and Memory: Cognition and memory normal.        Judgment: Judgment normal.     Results for orders placed or performed in visit on 09/26/21  CBC with Differential/Platelet  Result Value Ref Range   WBC 7.4 3.4 - 10.8 x10E3/uL   RBC 5.05 3.77 - 5.28 x10E6/uL   Hemoglobin 15.3 11.1 - 15.9 g/dL   Hematocrit 16.1 09.6 - 46.6 %   MCV 91 79 - 97 fL   MCH 30.3 26.6 - 33.0 pg   MCHC 33.3 31.5 - 35.7 g/dL   RDW 04.5 40.9 - 81.1 %   Platelets 328 150 - 450 x10E3/uL   Neutrophils 57 Not Estab. %   Lymphs 34 Not Estab. %   Monocytes 6 Not Estab. %   Eos 2 Not Estab. %   Basos 1 Not Estab. %   Neutrophils Absolute 4.2 1.4 - 7.0 x10E3/uL   Lymphocytes Absolute 2.5 0.7 - 3.1 x10E3/uL   Monocytes Absolute 0.5 0.1 - 0.9 x10E3/uL   EOS (ABSOLUTE) 0.2 0.0 - 0.4 x10E3/uL   Basophils Absolute 0.1 0.0 - 0.2  x10E3/uL   Immature Granulocytes 0 Not Estab. %   Immature Grans (Abs) 0.0 0.0 - 0.1 x10E3/uL  CMP14+EGFR  Result Value Ref Range   Glucose 80 70 - 99 mg/dL   BUN 13 6 - 24 mg/dL   Creatinine, Ser 9.14 0.57 - 1.00 mg/dL   eGFR 82 >78 GN/FAO/1.30   BUN/Creatinine Ratio 15 9 - 23   Sodium 141 134 - 144 mmol/L   Potassium 5.0 3.5 - 5.2 mmol/L   Chloride 103 96 - 106 mmol/L   CO2 27 20 - 29 mmol/L   Calcium 10.0 8.7 - 10.2 mg/dL   Total Protein 6.7 6.0 - 8.5 g/dL   Albumin 4.4 3.9 - 4.9 g/dL   Globulin, Total 2.3 1.5 - 4.5 g/dL   Albumin/Globulin Ratio 1.9 1.2 - 2.2   Bilirubin Total 0.7 0.0 - 1.2 mg/dL   Alkaline Phosphatase 66 44 - 121 IU/L   AST 22 0 - 40 IU/L   ALT 17 0 - 32 IU/L  Lipid panel  Result Value Ref Range   Cholesterol, Total 200 (H) 100 - 199 mg/dL   Triglycerides 161 0 - 149 mg/dL   HDL 65 >09 mg/dL   VLDL Cholesterol Cal 18 5 - 40 mg/dL   LDL Chol Calc (NIH) 604 (H) 0 - 99 mg/dL   Chol/HDL Ratio 3.1 0.0 - 4.4 ratio  TSH  Result Value Ref Range   TSH 2.280 0.450 - 4.500 uIU/mL  Bayer DCA Hb A1c Waived  Result Value Ref Range   HB A1C (BAYER DCA - WAIVED) 4.9 4.8 - 5.6 %       Pertinent labs & imaging results that were available during my care of the patient were reviewed by me and considered in my medical decision making.  Assessment & Plan:  Quyen was seen today for shoulder pain.  Diagnoses and all orders for this visit:  Acute pain of left shoulder Shoulder impingement syndrome. Discussed PT in detail. Unable to take oral prednisone or NSAID therapy. Will give IM toradol and depo-medrol in office today. Symptomatic care discussed in detail. Aware if not improved after 4 weeks of home rehab, will refer to PT.   Trapezius muscle spasm Significant spasm of trap muscle noted. Will treat with below. Heat and topical biofreeze recommended. Report new, worsening, or persistent symptoms.  -     cyclobenzaprine (FLEXERIL) 10 MG tablet; Take 1 tablet (10 mg  total) by mouth 3 (three) times daily as needed for muscle spasms.     Continue all other maintenance medications.  Follow up plan: Return in about 6 weeks (around 07/24/2022), or if symptoms worsen or fail to improve, for shoulder pain.   Continue healthy lifestyle choices, including diet (rich in fruits, vegetables, and lean proteins, and low in salt and simple carbohydrates) and exercise (at least 30 minutes of moderate physical activity daily).  Educational handout given for shoulder pain and rehab exercises.   The above assessment and management plan was discussed with the patient. The patient verbalized understanding of and has agreed to the management plan. Patient is aware to call the clinic if they develop any new symptoms or if symptoms persist or worsen. Patient is aware when to return to the clinic for a follow-up visit. Patient educated on when it is appropriate to go to the emergency department.   Kari Baars, FNP-C [facility] [phone]

## 2022-07-23 ENCOUNTER — Encounter: Payer: Self-pay | Admitting: Family Medicine

## 2022-07-23 ENCOUNTER — Ambulatory Visit (INDEPENDENT_AMBULATORY_CARE_PROVIDER_SITE_OTHER): Payer: No Typology Code available for payment source | Admitting: Family Medicine

## 2022-07-23 VITALS — BP 115/90 | HR 74 | Temp 98.6°F | Ht 66.0 in | Wt 228.0 lb

## 2022-07-23 DIAGNOSIS — F411 Generalized anxiety disorder: Secondary | ICD-10-CM | POA: Diagnosis not present

## 2022-07-23 DIAGNOSIS — M25512 Pain in left shoulder: Secondary | ICD-10-CM

## 2022-07-23 DIAGNOSIS — G43909 Migraine, unspecified, not intractable, without status migrainosus: Secondary | ICD-10-CM

## 2022-07-23 DIAGNOSIS — F339 Major depressive disorder, recurrent, unspecified: Secondary | ICD-10-CM

## 2022-07-23 MED ORDER — ESCITALOPRAM OXALATE 10 MG PO TABS: 10.0000 mg | ORAL_TABLET | Freq: Every day | ORAL | 3 refills | Status: DC

## 2022-07-23 MED ORDER — ELETRIPTAN HYDROBROMIDE 40 MG PO TABS: 40.0000 mg | ORAL_TABLET | ORAL | 11 refills | Status: DC | PRN

## 2022-07-23 NOTE — Progress Notes (Signed)
Established Patient Office Visit  Subjective   Patient ID: Barbara [name], female    DOB: 1975/09/10  Age: 47 y.o. MRN: [id number]  Chief Complaint  Patient presents with   Follow-up    6 wk recheck shoulder     HPI Here for chronic follow up.   She reports shoulder pain has resolved. She did some home exercies and had a steroid and toradol injection. Denies weakness.   She reports that her migraines have been well controlled with relpax. She needs a refill for this today.   She has been compliant with lexapro and reports good control without side effects.      07/23/2022    2:48 PM 09/26/2021   11:07 AM 08/01/2021    9:06 AM  Depression screen PHQ 2/9  Decreased Interest 1 0 0  Down, Depressed, Hopeless 1 1 0  PHQ - 2 Score 2 1 0  Altered sleeping 0 0 1  Tired, decreased energy 0 1 0  Change in appetite 1 0 0  Feeling bad or failure about yourself  1 0 0  Trouble concentrating 0 0 1  Moving slowly or fidgety/restless 0 0 0  Suicidal thoughts 0 0 0  PHQ-9 Score 4 2 2   Difficult doing work/chores Not difficult at all Not difficult at all Not difficult at all      07/23/2022    2:49 PM 09/26/2021   11:08 AM 08/01/2021    9:06 AM 07/24/2021    4:10 PM  GAD 7 : Generalized Anxiety Score  Nervous, Anxious, on Edge 1 1 1 1   Control/stop worrying 1 1 0 0  Worry too much - different things 1 1 0 0  Trouble relaxing 0 1 0 1  Restless 0 0 0 0  Easily annoyed or irritable 1 1 0 0  Afraid - awful might happen 0 1 0 1  Total GAD 7 Score 4 6 1 3   Anxiety Difficulty Not difficult at all Not difficult at all Not difficult at all Not difficult at all        ROS Negative unless specially indicated above in HPI.   Objective:     BP (!) 115/90   Pulse 74   Temp 98.6 F (37 C) (Oral)   Ht 5\' 6"  (1.676 m)   Wt 228 lb (103.4 kg)   SpO2 97%   BMI 36.80 kg/m    Physical Exam Vitals and nursing note reviewed.  Constitutional:      General: She is not in acute  distress.    Appearance: Normal appearance. She is not ill-appearing.  Cardiovascular:     Rate and Rhythm: Normal rate and regular rhythm.     Pulses: Normal pulses.     Heart sounds: Normal heart sounds. No murmur heard. Pulmonary:     Effort: Pulmonary effort is normal. No respiratory distress.     Breath sounds: Normal breath sounds.  Abdominal:     General: Bowel sounds are normal. There is no distension.     Palpations: Abdomen is soft. There is no mass.     Tenderness: There is no abdominal tenderness. There is no guarding or rebound.  Musculoskeletal:     Cervical back: Neck supple. No tenderness.     Right lower leg: No edema.     Left lower leg: No edema.  Lymphadenopathy:     Cervical: No cervical adenopathy.  Skin:    General: Skin is warm and dry.  Neurological:  General: No focal deficit present.     Mental Status: She is alert and oriented to person, place, and time.  Psychiatric:        Mood and Affect: Mood normal.        Behavior: Behavior normal.      No results found for any visits on 07/23/22.    The 10-year ASCVD risk score (Arnett DK, et al., 2019) is: 0.7%    Assessment & Plan:   Barbara [name] was seen today for follow-up.  Diagnoses and all orders for this visit:  Migraine without status migrainosus, not intractable, unspecified migraine type Well controlled on current regimen.  -     eletriptan (RELPAX) 40 MG tablet; Take 1 tablet (40 mg total) by mouth as needed for migraine or headache. May repeat in 2 hours if headache persists or recurs.  Depression, recurrent (HCC) GAD (generalized anxiety disorder) Well controlled on current regimen.  -     escitalopram (LEXAPRO) 10 MG tablet; Take 1 tablet (10 mg total) by mouth daily.  Acute pain of left shoulder Now resolved.    Return in about 3 months (around 10/23/2022) for CPE.   The patient indicates understanding of these issues and agrees with the plan.  Gabriel Earing, FNP

## 2022-08-28 ENCOUNTER — Encounter: Payer: Self-pay | Admitting: Family Medicine

## 2022-08-28 ENCOUNTER — Ambulatory Visit (INDEPENDENT_AMBULATORY_CARE_PROVIDER_SITE_OTHER): Payer: No Typology Code available for payment source | Admitting: Family Medicine

## 2022-08-28 VITALS — BP 117/79 | HR 77 | Temp 97.9°F | Resp 20 | Ht 66.0 in | Wt 228.4 lb

## 2022-08-28 DIAGNOSIS — F411 Generalized anxiety disorder: Secondary | ICD-10-CM | POA: Diagnosis not present

## 2022-08-28 DIAGNOSIS — E782 Mixed hyperlipidemia: Secondary | ICD-10-CM | POA: Diagnosis not present

## 2022-08-28 DIAGNOSIS — G43909 Migraine, unspecified, not intractable, without status migrainosus: Secondary | ICD-10-CM

## 2022-08-28 DIAGNOSIS — F339 Major depressive disorder, recurrent, unspecified: Secondary | ICD-10-CM

## 2022-08-28 LAB — LIPID PANEL

## 2022-08-28 LAB — BAYER DCA HB A1C WAIVED: HB A1C (BAYER DCA - WAIVED): 5.4 % (ref 4.8–5.6)

## 2022-08-28 NOTE — Progress Notes (Signed)
Established Patient Office Visit  Subjective   Patient ID: Barbara [name], female    DOB: [birth year]  Age: 47 y.o. MRN: [id number]  Chief Complaint  Patient presents with   Medical Management of Chronic Issues    HPI Here for chronic follow up. She also needs to complete biometric screening for her job. She is fasting for labs as they are requesting a lipid panel, blood sugar, and A1c. She reports doing well since her last visit. She has a CPE with Pap scheduled in a few months.   She reports that migraines are currently well controlled. She takes relpax prn with good relief.   Reports depression/anxiety is well controlled with lexapro.      08/28/2022    7:58 AM 07/23/2022    2:48 PM 09/26/2021   11:07 AM  Depression screen PHQ 2/9  Decreased Interest 0 1 0  Down, Depressed, Hopeless 1 1 1   PHQ - 2 Score 1 2 1   Altered sleeping 0 0 0  Tired, decreased energy 0 0 1  Change in appetite 0 1 0  Feeling bad or failure about yourself  0 1 0  Trouble concentrating 0 0 0  Moving slowly or fidgety/restless 0 0 0  Suicidal thoughts 0 0 0  PHQ-9 Score 1 4 2   Difficult doing work/chores Not difficult at all Not difficult at all Not difficult at all      08/28/2022    8:00 AM 07/23/2022    2:49 PM 09/26/2021   11:08 AM 08/01/2021    9:06 AM  GAD 7 : Generalized Anxiety Score  Nervous, Anxious, on Edge 1 1 1 1   Control/stop worrying 0 1 1 0  Worry too much - different things 0 1 1 0  Trouble relaxing 0 0 1 0  Restless 0 0 0 0  Easily annoyed or irritable 0 1 1 0  Afraid - awful might happen 0 0 1 0  Total GAD 7 Score 1 4 6 1   Anxiety Difficulty Not difficult at all Not difficult at all Not difficult at all Not difficult at all     ROS Negative unless specially indicated above in HPI.   Objective:     BP 117/79   Pulse 77   Temp 97.9 F (36.6 C) (Oral)   Resp 20   Ht 5\' 6"  (1.676 m)   Wt 228 lb 6 oz (103.6 kg)   SpO2 95%   BMI 36.86 kg/m    Physical Exam Vitals  and nursing note reviewed.  Constitutional:      General: She is not in acute distress.    Appearance: Normal appearance. She is not ill-appearing.  Cardiovascular:     Rate and Rhythm: Normal rate and regular rhythm.     Pulses: Normal pulses.     Heart sounds: Normal heart sounds. No murmur heard. Pulmonary:     Effort: Pulmonary effort is normal. No respiratory distress.     Breath sounds: Normal breath sounds.  Abdominal:     General: Bowel sounds are normal. There is no distension.     Palpations: Abdomen is soft. There is no mass.     Tenderness: There is no abdominal tenderness. There is no guarding or rebound.  Musculoskeletal:     Cervical back: Neck supple. No tenderness.     Right lower leg: No edema.     Left lower leg: No edema.  Lymphadenopathy:     Cervical: No cervical adenopathy.  Skin:  General: Skin is warm and dry.  Neurological:     General: No focal deficit present.     Mental Status: She is alert and oriented to person, place, and time.  Psychiatric:        Mood and Affect: Mood normal.        Behavior: Behavior normal.      No results found for any visits on 08/28/22.    The 10-year ASCVD risk score (Arnett DK, et al., 2019) is: 0.7%    Assessment & Plan:   Delbra was seen today for medical management of chronic issues.  Diagnoses and all orders for this visit:  Mixed hyperlipidemia Fasting labs pending. Diet and exercise.  -     Lipid panel  Migraine without status migrainosus, not intractable, unspecified migraine type Well controlled on current regimen.   Depression, recurrent (HCC) GAD (generalized anxiety disorder) Well controlled on current regimen.   Morbid obesity (HCC) BMI 36 with HLD and depression. Fasting labs pending. Diet and exercise.  -     TSH -     CBC with Differential/Platelet -     Vitamin D, 25-hydroxy -     CMP14+EGFR -     Bayer DCA Hb A1c Waived  Keep CPE with pap appt.   The patient indicates  understanding of these issues and agrees with the plan.  Gabriel Earing, FNP

## 2022-08-29 LAB — TSH: TSH: 2.48 u[IU]/mL (ref 0.450–4.500)

## 2022-08-29 LAB — CBC WITH DIFFERENTIAL/PLATELET
Basophils Absolute: 0.1 10*3/uL (ref 0.0–0.2)
Basos: 1 %
EOS (ABSOLUTE): 0.7 10*3/uL — ABNORMAL HIGH (ref 0.0–0.4)
Eos: 8 %
Hematocrit: 43.5 % (ref 34.0–46.6)
Hemoglobin: 14.8 g/dL (ref 11.1–15.9)
Immature Grans (Abs): 0 10*3/uL (ref 0.0–0.1)
Immature Granulocytes: 0 %
Lymphocytes Absolute: 2.9 10*3/uL (ref 0.7–3.1)
Lymphs: 35 %
MCH: 30.1 pg (ref 26.6–33.0)
MCHC: 34 g/dL (ref 31.5–35.7)
MCV: 88 fL (ref 79–97)
Monocytes Absolute: 0.5 10*3/uL (ref 0.1–0.9)
Monocytes: 6 %
Neutrophils Absolute: 4 10*3/uL (ref 1.4–7.0)
Neutrophils: 50 %
Platelets: 366 10*3/uL (ref 150–450)
RBC: 4.92 x10E6/uL (ref 3.77–5.28)
RDW: 12.8 % (ref 11.7–15.4)
WBC: 8.1 10*3/uL (ref 3.4–10.8)

## 2022-08-29 LAB — CMP14+EGFR
ALT: 13 IU/L (ref 0–32)
AST: 21 IU/L (ref 0–40)
Albumin: 4.2 g/dL (ref 3.9–4.9)
Alkaline Phosphatase: 70 IU/L (ref 44–121)
BUN/Creatinine Ratio: 12 (ref 9–23)
BUN: 11 mg/dL (ref 6–24)
Bilirubin Total: 0.4 mg/dL (ref 0.0–1.2)
CO2: 27 mmol/L (ref 20–29)
Calcium: 9.7 mg/dL (ref 8.7–10.2)
Chloride: 105 mmol/L (ref 96–106)
Creatinine, Ser: 0.9 mg/dL (ref 0.57–1.00)
Globulin, Total: 2.2 g/dL (ref 1.5–4.5)
Glucose: 83 mg/dL (ref 70–99)
Potassium: 4.2 mmol/L (ref 3.5–5.2)
Sodium: 143 mmol/L (ref 134–144)
Total Protein: 6.4 g/dL (ref 6.0–8.5)
eGFR: 79 mL/min/{1.73_m2} (ref >59)

## 2022-08-29 LAB — LIPID PANEL
Chol/HDL Ratio: 3.4 ratio (ref 0.0–4.4)
Cholesterol, Total: 190 mg/dL (ref 100–199)
HDL: 56 mg/dL (ref >39)
LDL Chol Calc (NIH): 113 mg/dL — ABNORMAL HIGH (ref 0–99)
Triglycerides: 119 mg/dL (ref 0–149)
VLDL Cholesterol Cal: 21 mg/dL (ref 5–40)

## 2022-08-29 LAB — VITAMIN D 25 HYDROXY (VIT D DEFICIENCY, FRACTURES): Vit D, 25-Hydroxy: 52.6 ng/mL (ref 30.0–100.0)

## 2022-09-29 ENCOUNTER — Ambulatory Visit
Admission: EM | Admit: 2022-09-29 | Discharge: 2022-09-29 | Disposition: A | Discharge status: Home / Self Care | Payer: No Typology Code available for payment source | Attending: Nurse Practitioner | Admitting: Nurse Practitioner

## 2022-09-29 DIAGNOSIS — M7661 Achilles tendinitis, right leg: Secondary | ICD-10-CM

## 2022-09-29 MED ORDER — METHYLPREDNISOLONE 4 MG PO TBPK: ORAL_TABLET | ORAL | 0 refills | Status: DC

## 2022-09-29 MED ORDER — DEXAMETHASONE SODIUM PHOSPHATE 10 MG/ML IJ SOLN
10.0000 mg | INTRAMUSCULAR | Status: AC
  Administered 2022-09-29: 10 mg via INTRAMUSCULAR

## 2022-09-29 NOTE — Discharge Instructions (Addendum)
You were given an injection of Decadron 10 mg today.  You will start the methylprednisolone on 09/30/2022. Take medication as prescribed.  If you begin taking the medication, and develop an adverse reaction such as shortness of breath, difficulty breathing, hives, rash, or other concerns, please stop the medication immediately and go to the emergency department. Continue over-the-counter Tylenol as needed for pain or discomfort. Apply ice to the affected area to help with pain or swelling. Perform the exercises provided at least 3-4 times daily. Wear shoes with good insole and support.  As discussed, you can also use an Ace wrap as needed. If symptoms are not improving with this treatment, recommend following up with orthopedics for further evaluation.  You can follow-up with [facility] or with [facility] for further evaluation. Follow-up as needed.

## 2022-09-29 NOTE — ED Provider Notes (Signed)
[facility]    CSN: [id number] Arrival date & time: 09/29/22  1804      History   Chief Complaint Chief Complaint  Patient presents with   Foot Pain    HPI Barbara [name] is a 47 y.o. female.   The history is provided by the patient.   The patient presents for complaints of right foot/heel pain that started over the past 24 hours.  Patient states that she went grocery shopping last evening and worsened different shoes.  She states while she was out, she developed a "burning" sensation in the right heel.  She states the sensation goes up into the lower portion of the right lower leg.  She denies injury, trauma, bruising, swelling, or inability to bear weight.  Patient experiences numbness when she holds her foot up in the ear.  Patient reports she has been taking Tylenol with minimal relief.  Past Medical History:  Diagnosis Date   Anxiety    Depression    History of palpitations 2011   work-up with cardiologist with event monitor, showed no arrhythmias   History of panic attacks    Migraine headache    Morbid obesity (HCC)    PONV (postoperative nausea and vomiting)     Patient Active Problem List   Diagnosis Date Noted   Mixed hyperlipidemia 08/28/2022   Depression, recurrent (HCC) 07/24/2021   Well adult exam 09/06/2019   Bariatric surgery status 11/26/2018   Cubital tunnel syndrome 08/25/2017   Grief 11/25/2016   Hypothyroid 09/02/2016   Screening for colorectal cancer 03/25/2016   Encounter for gynecological examination with Papanicolaou smear of cervix 03/25/2016   Dyspepsia    Unspecified symptom associated with female genital organs 12/22/2012   Morbid obesity (HCC) 10/29/2009   GAD (generalized anxiety disorder) 10/26/2009   Migraine headache 10/26/2009   PALPITATIONS 10/26/2009    Past Surgical History:  Procedure Laterality Date   COLONOSCOPY WITH PROPOFOL N/A 11/27/2020   Procedure: COLONOSCOPY WITH PROPOFOL;  Surgeon: Lanelle Bal, DO;  Location: [facility];  Service: Endoscopy;  Laterality: N/A;  9:30 / ASA II   D & C HYSTEROSCOPY W/ ENDOMETRIAL ABLATION  01-29-2005   dr Despina Hidden  @[facility]    ELBOW SURGERY Left    ESOPHAGOGASTRODUODENOSCOPY N/A 05/18/2014   SLF: 1. Mild esophaigitis. 2. Mild non-erosive gastritis.    LAPAROSCOPIC GASTRIC SLEEVE RESECTION N/A 12/22/2017   Procedure: LAPAROSCOPIC GASTRIC SLEEVE RESECTION, UPPER ENDO, ERAS PATHWAY;  Surgeon: Berna Bue, MD;  Location: [facility];  Service: General;  Laterality: N/A;   ORIF RADIUS & ULNA FRACTURES Left 11-01-2006   dr Melvyn Novas @[facility]    ULNAR NERVE TRANSPOSITION Left 2011   AND REMOVAL HARDWARE OF WRIST    OB History     Gravida  2   Para  2   Term  2   Preterm      AB      Living  2      SAB      IAB      Ectopic      Multiple      Live Births  2            Home Medications    Prior to Admission medications   Medication Sig Start Date End Date Taking? Authorizing Provider  Calcium-Phosphorus-Vitamin D (CALCIUM/D3 ADULT GUMMIES PO) Take 1 tablet by mouth 3 (three) times daily.   Yes [provider]  cetirizine (ZYRTEC) 10 MG tablet Take 10 mg  by mouth daily.   Yes [provider]  escitalopram (LEXAPRO) 10 MG tablet Take 1 tablet (10 mg total) by mouth daily. 07/23/22  Yes Gabriel Earing, FNP  methylPREDNISolone (MEDROL DOSEPAK) 4 MG TBPK tablet Take as directed. 09/29/22  Yes Sir Mallis-Warren, Sadie Haber, NP  Multiple Vitamins-Minerals (BARIATRIC FUSION) CHEW Chew 1 tablet by mouth in the morning. NO IRON   Yes [provider]  ondansetron (ZOFRAN) 4 MG tablet Take 1 tablet (4 mg total) by mouth every 8 (eight) hours as needed for nausea or vomiting. 07/24/21  Yes Gabriel Earing, FNP  ALPRAZolam Prudy Feeler) 0.5 MG tablet Take 0.25 mg by mouth daily as needed for anxiety.    [provider]  eletriptan (RELPAX) 40 MG tablet Take 1 tablet (40 mg total) by mouth as needed for migraine or headache. May  repeat in 2 hours if headache persists or recurs. 07/23/22   Gabriel Earing, FNP    Family History Family History  Problem Relation Age of Onset   Arthritis Mother    Depression Father    Arthritis Father    Anxiety disorder Father    Hypertension Father    Hyperlipidemia Father    Diabetes Father    Crohn's disease Daughter    Heart disease Paternal Grandmother    Arthritis Paternal Grandmother    Diabetes Paternal Grandmother    Hyperlipidemia Paternal Grandmother    Hypertension Paternal Grandmother    Heart attack Paternal Grandmother    Hypertension Paternal Grandfather    Arthritis Paternal Grandfather    Colon cancer Neg Hx     Social History Social History   Tobacco Use   Smoking status: Never   Smokeless tobacco: Never  Vaping Use   Vaping status: Never Used  Substance Use Topics   Alcohol use: No   Drug use: No     Allergies   Sulfamethoxazole-trimethoprim, Ibuprofen, Penicillins, Prednisone, Tape, and Latex   Review of Systems Review of Systems Per HPI  Physical Exam Triage Vital Signs ED Triage Vitals  Encounter Vitals Group     BP 09/29/22 1815 117/85     Systolic BP Percentile --      Diastolic BP Percentile --      Pulse Rate 09/29/22 1815 69     Resp 09/29/22 1815 16     Temp 09/29/22 1815 98.5 F (36.9 C)     Temp Source 09/29/22 1815 Oral     SpO2 09/29/22 1815 96 %     Weight --      Height --      Head Circumference --      Peak Flow --      Pain Score 09/29/22 1820 6     Pain Loc --      Pain Education --      Exclude from Growth Chart --    No data found.  Updated Vital Signs BP 117/85 (BP Location: Right Arm)   Pulse 69   Temp 98.5 F (36.9 C) (Oral)   Resp 16   SpO2 96%   Visual Acuity Right Eye Distance:   Left Eye Distance:   Bilateral Distance:    Right Eye Near:   Left Eye Near:    Bilateral Near:     Physical Exam Vitals and nursing note reviewed.  Constitutional:      General: She is not in acute  distress.    Appearance: Normal appearance.  HENT:     Head: Normocephalic.  Eyes:     Pupils: Pupils are equal, round, and reactive to light.  Cardiovascular:     Rate and Rhythm: Normal rate.  Pulmonary:     Effort: Pulmonary effort is normal.  Musculoskeletal:     Cervical back: Normal range of motion.     Right lower leg: Normal. No swelling, deformity or tenderness. No edema.     Right foot: Normal range of motion and normal capillary refill. No swelling or deformity. Normal pulse.     Comments: Tenderness noted to the right calcaneus into the right Achilles tendon.  There is no bruising, erythema, or swelling present.  Skin:    General: Skin is warm and dry.  Neurological:     General: No focal deficit present.     Mental Status: She is alert and oriented to person, place, and time.  Psychiatric:        Mood and Affect: Mood normal.        Behavior: Behavior normal.      UC Treatments / Results  Labs (all labs ordered are listed, but only abnormal results are displayed) Labs Reviewed - No data to display  EKG   Radiology No results found.  Procedures Procedures (including critical care time)  Medications Ordered in UC Medications  dexamethasone (DECADRON) injection 10 mg (has no administration in time range)    Initial Impression / Assessment and Plan / UC Course  I have reviewed the triage vital signs and the nursing notes.  Pertinent labs & imaging results that were available during my care of the patient were reviewed by me and considered in my medical decision making (see chart for details).  The patient is well-appearing, she is in no acute distress, vital signs are stable.  Symptoms appear to be consistent with Achilles tendinitis.  Imaging is not necessary as patient has not suffered any injury or trauma of the right foot or heel.  Decadron 10 mg IM administered.  Will treat patient with a Medrol Dosepak to help with inflammation.  Supportive care  recommendations were provided and discussed with the patient to include the use of ice, wearing shoes with good insole and support, continuing over-the-counter Tylenol, and performing the exercises provided.  Patient was advised that if symptoms are not improving with this treatment, recommend that she follow-up with orthopedics.  Patient was also advised that if she develops an adverse reaction to the medication, to stop the medication immediately, and go to the emergency department if she experiences shortness of breath, difficulty breathing, or other concerns.  Patient was given information for [facility] and for [facility].  Patient was in agreement with this plan of care and verbalizes understanding.  All questions were answered.  Patient stable for discharge.   Final Clinical Impressions(s) / UC Diagnoses   Final diagnoses:  Achilles tendinitis of right lower extremity     Discharge Instructions      You were given an injection of Decadron 10 mg today.  You will start the methylprednisolone on 09/30/2022. Take medication as prescribed.  If you begin taking the medication, and develop an adverse reaction such as shortness of breath, difficulty breathing, hives, rash, or other concerns, please stop the medication immediately and go to the emergency department. Continue over-the-counter Tylenol as needed for pain or discomfort. Apply ice to the affected area to help with pain or swelling. Perform the exercises provided at least 3-4 times daily. Wear shoes with good insole and support.  As discussed, you  can also use an Ace wrap as needed. If symptoms are not improving with this treatment, recommend following up with orthopedics for further evaluation.  You can follow-up with [facility] or with [facility] for further evaluation. Follow-up as needed.     ED Prescriptions     Medication Sig Dispense Auth. Provider   methylPREDNISolone (MEDROL DOSEPAK) 4 MG TBPK  tablet Take as directed. 21 tablet Justyna Timoney-Warren, Sadie Haber, NP      PDMP not reviewed this encounter.   Abran Cantor, NP 09/29/22 1851

## 2022-09-29 NOTE — ED Triage Notes (Signed)
Pt states she started having burning on right heel that comes and goes that started yesterday and if she hold her foot up it goes numb. Taking tylenol.

## 2022-10-03 ENCOUNTER — Encounter: Payer: Self-pay | Admitting: Family Medicine

## 2022-10-06 ENCOUNTER — Ambulatory Visit (INDEPENDENT_AMBULATORY_CARE_PROVIDER_SITE_OTHER): Payer: No Typology Code available for payment source

## 2022-10-06 ENCOUNTER — Ambulatory Visit (INDEPENDENT_AMBULATORY_CARE_PROVIDER_SITE_OTHER): Payer: No Typology Code available for payment source | Admitting: Family Medicine

## 2022-10-06 ENCOUNTER — Other Ambulatory Visit: Payer: Self-pay | Admitting: Family Medicine

## 2022-10-06 ENCOUNTER — Encounter: Payer: Self-pay | Admitting: Family Medicine

## 2022-10-06 VITALS — BP 115/70 | HR 78 | Temp 98.3°F | Ht 66.0 in | Wt 230.0 lb

## 2022-10-06 DIAGNOSIS — M25571 Pain in right ankle and joints of right foot: Secondary | ICD-10-CM

## 2022-10-06 DIAGNOSIS — M25572 Pain in left ankle and joints of left foot: Secondary | ICD-10-CM

## 2022-10-06 DIAGNOSIS — R001 Bradycardia, unspecified: Secondary | ICD-10-CM | POA: Diagnosis not present

## 2022-10-06 DIAGNOSIS — T380X5A Adverse effect of glucocorticoids and synthetic analogues, initial encounter: Secondary | ICD-10-CM | POA: Diagnosis not present

## 2022-10-06 DIAGNOSIS — M7661 Achilles tendinitis, right leg: Secondary | ICD-10-CM

## 2022-10-06 NOTE — Progress Notes (Signed)
   Acute Office Visit  Subjective:     Patient ID: Barbara [name], female    DOB: [birth year], 47 y.o.   MRN: [id number]  Chief Complaint  Patient presents with   Medication Problem    HPI Patient is in today for reaction to prednisone. She was given a prednisone injeciton and started on oral prednisone after an urgent care visit on 09/29/22 for suspected achilles tendonitis. Shortly after the injection, she noticed blurred vision that was intermittent. For the next two night her Apple watched alarmed that her heart rate was <40 bpm for 10 minutes. She discontinued prednisone on 10/03/22 and reports that her blurred vision has now resolved and she has not had any low heart rates since. Denies chest pain, shortness of breath, edema, dizziness, or palpitations.   She has continues to have right sided ankle pain. This started 1 week ago. It is a burning pain at the back of her ankle that radiates to her heel. She was having numbness in the arch of her foot, this has improved. Denies injury, trauma, swelling, inability to weight bear.   ROS As per HPI.     Objective:    BP 115/70   Pulse 78   Temp 98.3 F (36.8 C) (Temporal)   Ht 5\' 6"  (1.676 m)   Wt 230 lb (104.3 kg)   SpO2 95%   BMI 37.12 kg/m    Physical Exam Vitals and nursing note reviewed.  Constitutional:      General: She is not in acute distress.    Appearance: She is not ill-appearing, toxic-appearing or diaphoretic.  Cardiovascular:     Rate and Rhythm: Normal rate and regular rhythm.     Heart sounds: Normal heart sounds. No murmur heard. Pulmonary:     Effort: Pulmonary effort is normal. No respiratory distress.     Breath sounds: No wheezing.  Musculoskeletal:     Right lower leg: No edema.     Left lower leg: No edema.     Right ankle: No swelling, deformity or ecchymosis. Normal range of motion.     Right Achilles Tendon: Tenderness present. No defects. Thompson's test negative.  Skin:    General: Skin is  warm and dry.  Neurological:     General: No focal deficit present.     Mental Status: She is alert. She is disoriented.  Psychiatric:        Mood and Affect: Mood normal.        Behavior: Behavior normal.     No results found for any visits on 10/06/22.      Assessment & Plan:    Barbara [name] was seen today for medication problem.  Diagnoses and all orders for this visit:  Bradycardia Adverse effect of prednisone, initial encounter Discussed adverse reaction to prednisone. Now resolved after discontinuing prednisone. Prednisone added to allergy list. We did discuss a zio monitor in the event that bradycardia occurs again.   Acute right ankle pain Xray today in office, report pending. Concern for achilles tendonitis. Discussed ortho referral pending xray report.  -     DG Ankle Complete Right; Future   Return if symptoms worsen or fail to improve.  The patient indicates understanding of these issues and agrees with the plan.  Barbara Earing, FNP

## 2022-11-27 ENCOUNTER — Emergency Department (HOSPITAL_COMMUNITY)
Admission: EM | Admit: 2022-11-27 | Discharge: 2022-11-27 | Disposition: A | Discharge status: Home / Self Care | Payer: No Typology Code available for payment source | Attending: Emergency Medicine | Admitting: Emergency Medicine

## 2022-11-27 ENCOUNTER — Other Ambulatory Visit: Payer: Self-pay

## 2022-11-27 ENCOUNTER — Emergency Department (HOSPITAL_COMMUNITY): Payer: No Typology Code available for payment source

## 2022-11-27 ENCOUNTER — Encounter (HOSPITAL_COMMUNITY): Payer: Self-pay

## 2022-11-27 DIAGNOSIS — N200 Calculus of kidney: Secondary | ICD-10-CM

## 2022-11-27 DIAGNOSIS — M549 Dorsalgia, unspecified: Secondary | ICD-10-CM | POA: Insufficient documentation

## 2022-11-27 DIAGNOSIS — R112 Nausea with vomiting, unspecified: Secondary | ICD-10-CM | POA: Insufficient documentation

## 2022-11-27 DIAGNOSIS — R109 Unspecified abdominal pain: Secondary | ICD-10-CM | POA: Diagnosis present

## 2022-11-27 DIAGNOSIS — Z91040 Latex allergy status: Secondary | ICD-10-CM | POA: Diagnosis not present

## 2022-11-27 LAB — URINALYSIS, ROUTINE W REFLEX MICROSCOPIC
Bilirubin Urine: NEGATIVE
Glucose, UA: NEGATIVE mg/dL
Ketones, ur: NEGATIVE mg/dL
Leukocytes,Ua: NEGATIVE
Nitrite: NEGATIVE
Protein, ur: 100 mg/dL — AB
RBC / HPF: >50 RBC/hpf (ref 0–5)
Specific Gravity, Urine: 1.02 (ref 1.005–1.030)
pH: 5 (ref 5.0–8.0)

## 2022-11-27 LAB — CBC WITH DIFFERENTIAL/PLATELET
Abs Immature Granulocytes: 0.02 10*3/uL (ref 0.00–0.07)
Basophils Absolute: 0.1 10*3/uL (ref 0.0–0.1)
Basophils Relative: 1 %
Eosinophils Absolute: 0.3 10*3/uL (ref 0.0–0.5)
Eosinophils Relative: 3 %
HCT: 42.5 % (ref 36.0–46.0)
Hemoglobin: 14.5 g/dL (ref 12.0–15.0)
Immature Granulocytes: 0 %
Lymphocytes Relative: 31 %
Lymphs Abs: 3.5 10*3/uL (ref 0.7–4.0)
MCH: 30.7 pg (ref 26.0–34.0)
MCHC: 34.1 g/dL (ref 30.0–36.0)
MCV: 89.9 fL (ref 80.0–100.0)
Monocytes Absolute: 0.8 10*3/uL (ref 0.1–1.0)
Monocytes Relative: 7 %
Neutro Abs: 6.7 10*3/uL (ref 1.7–7.7)
Neutrophils Relative %: 58 %
Platelets: 341 10*3/uL (ref 150–400)
RBC: 4.73 MIL/uL (ref 3.87–5.11)
RDW: 12.9 % (ref 11.5–15.5)
WBC: 11.4 10*3/uL — ABNORMAL HIGH (ref 4.0–10.5)
nRBC: 0 % (ref 0.0–0.2)

## 2022-11-27 LAB — BASIC METABOLIC PANEL
Anion gap: 10 (ref 5–15)
BUN: 17 mg/dL (ref 6–20)
CO2: 24 mmol/L (ref 22–32)
Calcium: 8.6 mg/dL — ABNORMAL LOW (ref 8.9–10.3)
Chloride: 104 mmol/L (ref 98–111)
Creatinine, Ser: 0.92 mg/dL (ref 0.44–1.00)
GFR, Estimated: >60 mL/min (ref >60)
Glucose, Bld: 126 mg/dL — ABNORMAL HIGH (ref 70–99)
Potassium: 3.9 mmol/L (ref 3.5–5.1)
Sodium: 138 mmol/L (ref 135–145)

## 2022-11-27 MED ORDER — ONDANSETRON HCL 4 MG/2ML IJ SOLN
4.0000 mg | Freq: Once | INTRAMUSCULAR | Status: AC
  Administered 2022-11-27: 4 mg via INTRAVENOUS
  Filled 2022-11-27: qty 2

## 2022-11-27 MED ORDER — OXYCODONE-ACETAMINOPHEN 5-325 MG PO TABS
1.0000 | ORAL_TABLET | Freq: Four times a day (QID) | ORAL | 0 refills | Status: DC | PRN
Start: 2022-11-27 — End: 2023-04-23

## 2022-11-27 MED ORDER — HYDROMORPHONE HCL 1 MG/ML IJ SOLN
1.0000 mg | Freq: Once | INTRAMUSCULAR | Status: AC
  Administered 2022-11-27: 1 mg via INTRAVENOUS
  Filled 2022-11-27: qty 1

## 2022-11-27 NOTE — ED Notes (Signed)
POC preg negative.

## 2022-11-27 NOTE — ED Triage Notes (Signed)
Pt c/o RLQ abdominal pain. Pain started intermittently Tuesday night but pt states that pain awoke her from sleep and now is intense and constant. Pt 10/10. Endorses nausea.

## 2022-11-27 NOTE — ED Provider Notes (Signed)
[place] EMERGENCY DEPARTMENT AT [facility] Provider Note   CSN: [id number] Arrival date & time: 11/27/22  0101     History  Chief Complaint  Patient presents with   Flank Pain    Barbara [name] is a 47 y.o. female.  Presents to the emergency department for evaluation of right flank pain.  Patient had some mild pain in the back yesterday that went away.  Tonight she had sudden onset of severe pain from the back all the way to the right lower abdomen associated with numerous episodes of nausea and vomiting.       Home Medications Prior to Admission medications   Medication Sig Start Date End Date Taking? Authorizing Provider  ALPRAZolam Prudy Feeler) 0.5 MG tablet Take 0.25 mg by mouth daily as needed for anxiety.    [provider]  Calcium-Phosphorus-Vitamin D (CALCIUM/D3 ADULT GUMMIES PO) Take 1 tablet by mouth 3 (three) times daily.    [provider]  cetirizine (ZYRTEC) 10 MG tablet Take 10 mg by mouth daily.    [provider]  eletriptan (RELPAX) 40 MG tablet Take 1 tablet (40 mg total) by mouth as needed for migraine or headache. May repeat in 2 hours if headache persists or recurs. 07/23/22   Gabriel Earing, FNP  escitalopram (LEXAPRO) 10 MG tablet Take 1 tablet (10 mg total) by mouth daily. 07/23/22   Gabriel Earing, FNP  Multiple Vitamins-Minerals (BARIATRIC FUSION) CHEW Chew 1 tablet by mouth in the morning. NO IRON    [provider]  ondansetron (ZOFRAN) 4 MG tablet Take 1 tablet (4 mg total) by mouth every 8 (eight) hours as needed for nausea or vomiting. 07/24/21   Gabriel Earing, FNP      Allergies    Prednisone, Sulfamethoxazole-trimethoprim, Ibuprofen, Penicillins, Tape, and Latex    Review of Systems   Review of Systems  Physical Exam Updated Vital Signs BP 138/87   Pulse 74   Temp 98.8 F (37.1 C) (Oral)   Resp 20   Wt 104.3 kg   SpO2 97%   BMI 37.12 kg/m  Physical Exam Vitals and nursing note  reviewed.  Constitutional:      General: She is in acute distress.     Appearance: She is well-developed.  HENT:     Head: Normocephalic and atraumatic.     Mouth/Throat:     Mouth: Mucous membranes are moist.  Eyes:     General: Vision grossly intact. Gaze aligned appropriately.     Extraocular Movements: Extraocular movements intact.     Conjunctiva/sclera: Conjunctivae normal.  Cardiovascular:     Rate and Rhythm: Normal rate and regular rhythm.     Pulses: Normal pulses.     Heart sounds: Normal heart sounds, S1 normal and S2 normal. No murmur heard.    No friction rub. No gallop.  Pulmonary:     Effort: Pulmonary effort is normal. No respiratory distress.     Breath sounds: Normal breath sounds.  Abdominal:     General: Bowel sounds are normal.     Palpations: Abdomen is soft.     Tenderness: There is no abdominal tenderness. There is no guarding or rebound.     Hernia: No hernia is present.  Musculoskeletal:        General: No swelling.     Cervical back: Full passive range of motion without pain, normal range of motion and neck supple. No spinous process tenderness or muscular tenderness. Normal range  of motion.     Right lower leg: No edema.     Left lower leg: No edema.  Skin:    General: Skin is warm and dry.     Capillary Refill: Capillary refill takes less than 2 seconds.     Findings: No ecchymosis, erythema, rash or wound.  Neurological:     General: No focal deficit present.     Mental Status: She is alert and oriented to person, place, and time.     GCS: GCS eye subscore is 4. GCS verbal subscore is 5. GCS motor subscore is 6.     Cranial Nerves: Cranial nerves 2-12 are intact.     Sensory: Sensation is intact.     Motor: Motor function is intact.     Coordination: Coordination is intact.  Psychiatric:        Attention and Perception: Attention normal.        Mood and Affect: Mood normal.        Speech: Speech normal.        Behavior: Behavior normal.      ED Results / Procedures / Treatments   Labs (all labs ordered are listed, but only abnormal results are displayed) Labs Reviewed  CBC WITH DIFFERENTIAL/PLATELET - Abnormal; Notable for the following components:      Result Value   WBC 11.4 (*)    All other components within normal limits  BASIC METABOLIC PANEL - Abnormal; Notable for the following components:   Glucose, Bld 126 (*)    Calcium 8.6 (*)    All other components within normal limits  URINALYSIS, ROUTINE W REFLEX MICROSCOPIC - Abnormal; Notable for the following components:   APPearance HAZY (*)    Hgb urine dipstick LARGE (*)    Protein, ur 100 (*)    Bacteria, UA RARE (*)    All other components within normal limits    EKG None  Radiology CT RENAL STONE STUDY  Result Date: 11/27/2022 CLINICAL DATA:  Abdominal/flank pain, stone suspected RLQ abdominal pain. Pain started intermittently Tuesday night but pt states that pain awoke her from sleep and now is intense and constant. Pt 10/10. Endorses nausea. EXAM: CT ABDOMEN AND PELVIS WITHOUT CONTRAST TECHNIQUE: Multidetector CT imaging of the abdomen and pelvis was performed following the standard protocol without IV contrast. RADIATION DOSE REDUCTION: This exam was performed according to the departmental dose-optimization program which includes automated exposure control, adjustment of the mA and/or kV according to patient size and/or use of iterative reconstruction technique. COMPARISON:  CT abdomen pelvis 04/16/2014 FINDINGS: Lower chest: Tiny hiatal hernia.  No acute abnormality. Hepatobiliary: No focal liver abnormality. No gallstones, gallbladder wall thickening, or pericholecystic fluid. No biliary dilatation. Pancreas: No focal lesion. Normal pancreatic contour. No surrounding inflammatory changes. No main pancreatic ductal dilatation. Spleen: Normal in size without focal abnormality. Adrenals/Urinary Tract: No adrenal nodule bilaterally. 4 mm right and punctate left  nephrolithiasis. No ureterolithiasis bilaterally. Mild hydronephrosis of the right kidney. No left nephroureterolithiasis. The urinary bladder is unremarkable. Stomach/Bowel: Gastric sleeve surgical changes noted. Stomach is within normal limits. No evidence of bowel wall thickening or dilatation. Colonic diverticulosis. Appendix appears normal. Vascular/Lymphatic: No abdominal aorta or iliac aneurysm. No abdominal, pelvic, or inguinal lymphadenopathy. Reproductive: Uterus and bilateral adnexa are unremarkable. Other: No intraperitoneal free fluid. No intraperitoneal free gas. No organized fluid collection. Musculoskeletal: No abdominal wall hernia or abnormality. No suspicious lytic or blastic osseous lesions. No acute displaced fracture. IMPRESSION: 1. Indeterminate etiology of mild right  hydronephrosis. This may reflect the residual of a recently passed calculus or reflect chronic changes of obstructive uropathy or reflux. 2. Bilateral nonobstructive nephrolithiasis measuring up to 4 mm on the right and punctate on the left. 3. Colonic diverticulosis with no acute diverticulitis. 4. Tiny hiatal hernia in the setting of gastric sleeve surgical changes. Electronically Signed   By: Tish Frederickson M.D.   On: 11/27/2022 02:26    Procedures Procedures    Medications Ordered in ED Medications  HYDROmorphone (DILAUDID) injection 1 mg (1 mg Intravenous Given 11/27/22 0204)  ondansetron (ZOFRAN) injection 4 mg (4 mg Intravenous Given 11/27/22 0205)    ED Course/ Medical Decision Making/ A&P                                 Medical Decision Making Amount and/or Complexity of Data Reviewed Labs: ordered. Radiology: ordered.  Risk Prescription drug management.   Differential Diagnosis considered includes, but not limited to: Renal colic/kidney stone; appendicitis; pyelonephritis; aortic dissection; musculoskeletal pain.   Presents to the emergency department for evaluation of sudden onset severe  right flank pain.  Pain associated with nausea and vomiting.  Urinalysis with large amount of microscopic hematuria, no clear signs of infection.  Kidney function normal.  White count slightly elevated above 11.  Patient underwent CT scan.  Normal appendix is visualized.  Patient with right-sided hydronephrosis and proximal hydroureter.  This is likely secondary to recently passed stone.  Alternatively there could be a non-radiopaque stone.  There are, however, stones seen in both kidneys.  No other abnormality noted.  Appendix is normal.  Patient administered pain medication.  Upon recheck after CT scan, patient is experiencing no pain.  Suspect she passed the stone, will discharge with additional analgesia, follow-up with urology.        Final Clinical Impression(s) / ED Diagnoses Final diagnoses:  Right flank pain    Rx / DC Orders ED Discharge Orders     None         Mina Babula, Canary Brim, MD 11/27/22 0302

## 2022-11-28 ENCOUNTER — Ambulatory Visit: Payer: No Typology Code available for payment source | Admitting: Urology

## 2022-11-28 ENCOUNTER — Encounter: Payer: Self-pay | Admitting: Urology

## 2022-11-28 ENCOUNTER — Ambulatory Visit (HOSPITAL_COMMUNITY)
Admission: RE | Admit: 2022-11-28 | Discharge: 2022-11-28 | Disposition: A | Discharge status: Home / Self Care | Payer: No Typology Code available for payment source | Source: Ambulatory Visit | Attending: Urology | Admitting: Urology

## 2022-11-28 VITALS — BP 122/81 | HR 87

## 2022-11-28 DIAGNOSIS — N200 Calculus of kidney: Secondary | ICD-10-CM | POA: Diagnosis present

## 2022-11-28 DIAGNOSIS — N1330 Unspecified hydronephrosis: Secondary | ICD-10-CM

## 2022-11-28 LAB — URINALYSIS, ROUTINE W REFLEX MICROSCOPIC
Bilirubin, UA: NEGATIVE
Glucose, UA: NEGATIVE
Ketones, UA: NEGATIVE
Leukocytes,UA: NEGATIVE
Nitrite, UA: NEGATIVE
Protein,UA: NEGATIVE
Specific Gravity, UA: 1.01 (ref 1.005–1.030)
Urobilinogen, Ur: 0.2 mg/dL (ref 0.2–1.0)
pH, UA: 6 (ref 5.0–7.5)

## 2022-11-28 LAB — MICROSCOPIC EXAMINATION

## 2022-11-28 MED ORDER — SODIUM BICARBONATE 650 MG PO TABS
650.0000 mg | ORAL_TABLET | Freq: Two times a day (BID) | ORAL | 11 refills | Status: DC
Start: 2022-11-28 — End: 2023-01-02

## 2022-11-28 MED FILL — Oxycodone w/ Acetaminophen Tab 5-325 MG: ORAL | Qty: 6 | Status: AC

## 2022-11-28 NOTE — Progress Notes (Signed)
11/28/2022 10:18 AM   Barbara [name] April 25, 1975 [id number]  Referring provider: Gilda Crease, MD [address] [place],  [place] [address]   nephrolithiasis  HPI: [name] is a 47yo here for evaluation of nephrolithiasis. She had a stone event 20 years ago. Yesterday she developed right flank pain with nausea and presented to the ER. CT showed mild right hydronephrosis likely due to a recently passed stone. She has bilateral renal calculi 4-43mm. She has mild right flank pain. No fevers. No significant LUTS.  She has a family hx of nephrolithiasis. She has a hx of a gastric sleeve. She has a high protein diet.    PMH: Past Medical History:  Diagnosis Date   Anxiety    Depression    History of palpitations 2011   work-up with cardiologist with event monitor, showed no arrhythmias   History of panic attacks    Migraine headache    Morbid obesity (HCC)    PONV (postoperative nausea and vomiting)     Surgical History: Past Surgical History:  Procedure Laterality Date   COLONOSCOPY WITH PROPOFOL N/A 11/27/2020   Procedure: COLONOSCOPY WITH PROPOFOL;  Surgeon: Lanelle Bal, DO;  Location: [facility];  Service: Endoscopy;  Laterality: N/A;  9:30 / ASA II   D & C HYSTEROSCOPY W/ ENDOMETRIAL ABLATION  01-29-2005   dr Despina Hidden  @[facility]    ELBOW SURGERY Left    ESOPHAGOGASTRODUODENOSCOPY N/A 05/18/2014   SLF: 1. Mild esophaigitis. 2. Mild non-erosive gastritis.    LAPAROSCOPIC GASTRIC SLEEVE RESECTION N/A 12/22/2017   Procedure: LAPAROSCOPIC GASTRIC SLEEVE RESECTION, UPPER ENDO, ERAS PATHWAY;  Surgeon: Berna Bue, MD;  Location: [facility];  Service: General;  Laterality: N/A;   ORIF RADIUS & ULNA FRACTURES Left 11-01-2006   dr Melvyn Novas @[facility]    ULNAR NERVE TRANSPOSITION Left 2011   AND REMOVAL HARDWARE OF WRIST    Home Medications:  Allergies as of 11/28/2022       Reactions   Prednisone Other (See Comments)   BRADYCARDIA-heart rate below 40 for longer that 10  minutes   Sulfamethoxazole-trimethoprim Nausea And Vomiting, Rash   Ibuprofen    Had gastric sleeve   Penicillins Hives   Tolerates cephalosporins. Has patient had a PCN reaction causing immediate rash, facial/tongue/throat swelling, SOB or lightheadedness with hypotension: No Has patient had a PCN reaction causing severe rash involving mucus membranes or skin necrosis: No Has patient had a PCN reaction that required hospitalization: No Has patient had a PCN reaction occurring within the last 10 years: No If all of the above answers are "NO", then may proceed with Cephalosporin use.   Tape Other (See Comments)   PAPER TAPE OK; RED AND RAW SKIN & SKIN TEARS   Latex Rash   Some gloves cause red and raw skin. Skin peels off.         Medication List        Accurate as of November 28, 2022 10:18 AM. If you have any questions, ask your nurse or doctor.          ALPRAZolam 0.5 MG tablet Commonly known as: XANAX Take 0.25 mg by mouth daily as needed for anxiety.   Bariatric Fusion Chew Chew 1 tablet by mouth in the morning. NO IRON   CALCIUM/D3 ADULT GUMMIES PO Take 1 tablet by mouth 3 (three) times daily.   cetirizine 10 MG tablet Commonly known as: ZYRTEC Take 10 mg by mouth daily.   eletriptan 40 MG tablet  Commonly known as: RELPAX Take 1 tablet (40 mg total) by mouth as needed for migraine or headache. May repeat in 2 hours if headache persists or recurs.   escitalopram 10 MG tablet Commonly known as: LEXAPRO Take 1 tablet (10 mg total) by mouth daily.   ondansetron 4 MG tablet Commonly known as: Zofran Take 1 tablet (4 mg total) by mouth every 8 (eight) hours as needed for nausea or vomiting.   oxyCODONE-acetaminophen 5-325 MG tablet Commonly known as: PERCOCET/ROXICET Take 1 tablet by mouth every 6 (six) hours as needed for severe pain.        Allergies:  Allergies  Allergen Reactions   Prednisone Other (See Comments)    BRADYCARDIA-heart rate below 40  for longer that 10 minutes   Sulfamethoxazole-Trimethoprim Nausea And Vomiting and Rash   Ibuprofen     Had gastric sleeve   Penicillins Hives    Tolerates cephalosporins. Has patient had a PCN reaction causing immediate rash, facial/tongue/throat swelling, SOB or lightheadedness with hypotension: No Has patient had a PCN reaction causing severe rash involving mucus membranes or skin necrosis: No Has patient had a PCN reaction that required hospitalization: No Has patient had a PCN reaction occurring within the last 10 years: No If all of the above answers are "NO", then may proceed with Cephalosporin use.    Tape Other (See Comments)    PAPER TAPE OK; RED AND RAW SKIN & SKIN TEARS   Latex Rash    Some gloves cause red and raw skin. Skin peels off.     Family History: Family History  Problem Relation Age of Onset   Arthritis Mother    Depression Father    Arthritis Father    Anxiety disorder Father    Hypertension Father    Hyperlipidemia Father    Diabetes Father    Crohn's disease Daughter    Heart disease Paternal Grandmother    Arthritis Paternal Grandmother    Diabetes Paternal Grandmother    Hyperlipidemia Paternal Grandmother    Hypertension Paternal Grandmother    Heart attack Paternal Grandmother    Hypertension Paternal Grandfather    Arthritis Paternal Grandfather    Colon cancer Neg Hx     Social History:  reports that she has never smoked. She has never used smokeless tobacco. She reports that she does not drink alcohol and does not use drugs.  ROS: All other review of systems were reviewed and are negative except what is noted above in HPI  Physical Exam: BP 122/81   Pulse 87   Constitutional:  Alert and oriented, No acute distress. HEENT: May AT, moist mucus membranes.  Trachea midline, no masses. Cardiovascular: No clubbing, cyanosis, or edema. Respiratory: Normal respiratory effort, no increased work of breathing. GI: Abdomen is soft, nontender,  nondistended, no abdominal masses GU: No CVA tenderness.  Lymph: No cervical or inguinal lymphadenopathy. Skin: No rashes, bruises or suspicious lesions. Neurologic: Grossly intact, no focal deficits, moving all 4 extremities. Psychiatric: Normal mood and affect.  Laboratory Data: Lab Results  Component Value Date   WBC 11.4 (H) 11/27/2022   HGB 14.5 11/27/2022   HCT 42.5 11/27/2022   MCV 89.9 11/27/2022   PLT 341 11/27/2022    Lab Results  Component Value Date   CREATININE 0.92 11/27/2022    No results found for: "PSA"  No results found for: "TESTOSTERONE"  Lab Results  Component Value Date   HGBA1C 5.4 08/28/2022    Urinalysis    Component Value  Date/Time   COLORURINE YELLOW 11/27/2022 0220   APPEARANCEUR HAZY (A) 11/27/2022 0220   LABSPEC 1.020 11/27/2022 0220   PHURINE 5.0 11/27/2022 0220   GLUCOSEU NEGATIVE 11/27/2022 0220   HGBUR LARGE (A) 11/27/2022 0220   HGBUR 3+ 12/07/2010 1558   BILIRUBINUR NEGATIVE 11/27/2022 0220   KETONESUR NEGATIVE 11/27/2022 0220   PROTEINUR 100 (A) 11/27/2022 0220   UROBILINOGEN 0.2 04/16/2014 1150   NITRITE NEGATIVE 11/27/2022 0220   LEUKOCYTESUR NEGATIVE 11/27/2022 0220    Lab Results  Component Value Date   BACTERIA RARE (A) 11/27/2022    Pertinent Imaging: CT 11/27/2022 and KUB today: Images reviewed and discussed with the patient No results found for this or any previous visit.  No results found for this or any previous visit.  No results found for this or any previous visit.  No results found for this or any previous visit.  No results found for this or any previous visit.  No valid procedures specified. No results found for this or any previous visit.  Results for orders placed during the hospital encounter of 11/27/22  CT RENAL STONE STUDY  Narrative CLINICAL DATA:  Abdominal/flank pain, stone suspected RLQ abdominal pain. Pain started intermittently Tuesday night but pt states that pain awoke her  from sleep and now is intense and constant. Pt 10/10. Endorses nausea.  EXAM: CT ABDOMEN AND PELVIS WITHOUT CONTRAST  TECHNIQUE: Multidetector CT imaging of the abdomen and pelvis was performed following the standard protocol without IV contrast.  RADIATION DOSE REDUCTION: This exam was performed according to the departmental dose-optimization program which includes automated exposure control, adjustment of the mA and/or kV according to patient size and/or use of iterative reconstruction technique.  COMPARISON:  CT abdomen pelvis 04/16/2014  FINDINGS: Lower chest: Tiny hiatal hernia.  No acute abnormality.  Hepatobiliary: No focal liver abnormality. No gallstones, gallbladder wall thickening, or pericholecystic fluid. No biliary dilatation.  Pancreas: No focal lesion. Normal pancreatic contour. No surrounding inflammatory changes. No main pancreatic ductal dilatation.  Spleen: Normal in size without focal abnormality.  Adrenals/Urinary Tract:  No adrenal nodule bilaterally.  4 mm right and punctate left nephrolithiasis. No ureterolithiasis bilaterally. Mild hydronephrosis of the right kidney. No left nephroureterolithiasis.  The urinary bladder is unremarkable.  Stomach/Bowel: Gastric sleeve surgical changes noted. Stomach is within normal limits. No evidence of bowel wall thickening or dilatation. Colonic diverticulosis. Appendix appears normal.  Vascular/Lymphatic: No abdominal aorta or iliac aneurysm. No abdominal, pelvic, or inguinal lymphadenopathy.  Reproductive: Uterus and bilateral adnexa are unremarkable.  Other: No intraperitoneal free fluid. No intraperitoneal free gas. No organized fluid collection.  Musculoskeletal:  No abdominal wall hernia or abnormality.  No suspicious lytic or blastic osseous lesions. No acute displaced fracture.  IMPRESSION: 1. Indeterminate etiology of mild right hydronephrosis. This may reflect the residual of a recently  passed calculus or reflect chronic changes of obstructive uropathy or reflux. 2. Bilateral nonobstructive nephrolithiasis measuring up to 4 mm on the right and punctate on the left. 3. Colonic diverticulosis with no acute diverticulitis. 4. Tiny hiatal hernia in the setting of gastric sleeve surgical changes.   Electronically Signed By: Tish Frederickson M.D. On: 11/27/2022 02:26   Assessment & Plan:    1. Kidney stones -Followup 1 month with Renal US to ensure resolution of the right hydronephrosis -We will trial sodium bicarbonate 650mg  BID - Urinalysis, Routine w reflex microscopic   No follow-ups on file.  [name], MD  [facility] [place]

## 2022-12-02 ENCOUNTER — Encounter: Payer: Self-pay | Admitting: Urology

## 2022-12-02 ENCOUNTER — Telehealth: Payer: Self-pay

## 2022-12-02 NOTE — Telephone Encounter (Signed)
Patient dropped of Frederika paperwork.  Pending process.

## 2022-12-02 NOTE — Patient Instructions (Signed)

## 2022-12-03 ENCOUNTER — Ambulatory Visit: Payer: No Typology Code available for payment source | Admitting: Family Medicine

## 2022-12-03 ENCOUNTER — Encounter: Payer: Self-pay | Admitting: Urology

## 2022-12-03 ENCOUNTER — Other Ambulatory Visit (HOSPITAL_COMMUNITY)
Admission: RE | Admit: 2022-12-03 | Discharge: 2022-12-03 | Disposition: A | Discharge status: Home / Self Care | Payer: No Typology Code available for payment source | Source: Ambulatory Visit | Attending: Family Medicine | Admitting: Family Medicine

## 2022-12-03 VITALS — BP 110/82 | HR 78 | Temp 98.2°F | Ht 66.0 in | Wt 229.4 lb

## 2022-12-03 DIAGNOSIS — Z01419 Encounter for gynecological examination (general) (routine) without abnormal findings: Secondary | ICD-10-CM | POA: Insufficient documentation

## 2022-12-03 DIAGNOSIS — Z0000 Encounter for general adult medical examination without abnormal findings: Secondary | ICD-10-CM

## 2022-12-03 NOTE — Progress Notes (Addendum)
Complete physical exam  Patient: Barbara [name]   DOB: Feb 09, 1976   48 y.o. Female  MRN: [id number]  Subjective:    Chief Complaint  Patient presents with   Annual Exam    Barbara [name] is a 47 y.o. female who presents today for a complete physical exam. She reports consuming a general diet. The patient does not participate in regular exercise at present. She generally feels well. She reports sleeping fairly well. She does not have additional problems to discuss today.    Most recent fall risk assessment:    10/06/2022    1:28 PM  Fall Risk   Falls in the past year? 0     Most recent depression screenings:    12/03/2022    8:20 AM 10/06/2022    1:31 PM 08/28/2022    7:58 AM  Depression screen PHQ 2/9  Decreased Interest 1 0 0  Down, Depressed, Hopeless 0 0 1  PHQ - 2 Score 1 0 1  Altered sleeping 0 0 0  Tired, decreased energy 0 0 0  Change in appetite 0 0 0  Feeling bad or failure about yourself  0 0 0  Trouble concentrating 0 0 0  Moving slowly or fidgety/restless 0 0 0  Suicidal thoughts 0 0 0  PHQ-9 Score 1 0 1  Difficult doing work/chores Not difficult at all Not difficult at all Not difficult at all      12/03/2022    8:19 AM 10/06/2022    1:28 PM 08/28/2022    8:00 AM 07/23/2022    2:49 PM  GAD 7 : Generalized Anxiety Score  Nervous, Anxious, on Edge 0 1 1 1   Control/stop worrying 1 0 0 1  Worry too much - different things 0 0 0 1  Trouble relaxing 0 0 0 0  Restless 0 0 0 0  Easily annoyed or irritable 0 0 0 1  Afraid - awful might happen 1 0 0 0  Total GAD 7 Score 2 1 1 4   Anxiety Difficulty Not difficult at all Not difficult at all Not difficult at all Not difficult at all      Vision:Within last year and Dental: No current dental problems and Receives regular dental care  Past Medical History:  Diagnosis Date   Anxiety    Depression    History of palpitations 2011   work-up with cardiologist with event monitor, showed no arrhythmias    History of panic attacks    Migraine headache    Morbid obesity (HCC)    PONV (postoperative nausea and vomiting)       Patient Care Team: Gabriel Earing, FNP as PCP - General (Family Medicine) West Bali, MD (Inactive) as Consulting Physician (Gastroenterology) Lazaro Arms, MD as Consulting Physician (Obstetrics and Gynecology)   Outpatient Medications Prior to Visit  Medication Sig   ALPRAZolam Prudy Feeler) 0.5 MG tablet Take 0.25 mg by mouth daily as needed for anxiety.   Calcium-Phosphorus-Vitamin D (CALCIUM/D3 ADULT GUMMIES PO) Take 1 tablet by mouth 3 (three) times daily.   cetirizine (ZYRTEC) 10 MG tablet Take 10 mg by mouth daily.   eletriptan (RELPAX) 40 MG tablet Take 1 tablet (40 mg total) by mouth as needed for migraine or headache. May repeat in 2 hours if headache persists or recurs.   escitalopram (LEXAPRO) 10 MG tablet Take 1 tablet (10 mg total) by mouth daily.   meloxicam (MOBIC) 7.5 MG tablet Take 7.5 mg by mouth daily.  Multiple Vitamins-Minerals (BARIATRIC FUSION) CHEW Chew 1 tablet by mouth in the morning. NO IRON   ondansetron (ZOFRAN) 4 MG tablet Take 1 tablet (4 mg total) by mouth every 8 (eight) hours as needed for nausea or vomiting.   oxyCODONE-acetaminophen (PERCOCET/ROXICET) 5-325 MG tablet Take 1 tablet by mouth every 6 (six) hours as needed for severe pain.   sodium bicarbonate 650 MG tablet Take 1 tablet (650 mg total) by mouth 2 (two) times daily.   No facility-administered medications prior to visit.    ROS Negative unless specially indicated above in HPI.     Objective:     BP 110/82   Pulse 78   Temp 98.2 F (36.8 C) (Temporal)   Ht 5\' 6"  (1.676 m)   Wt 229 lb 6 oz (104 kg)   SpO2 96%   BMI 37.02 kg/m    Physical Exam Vitals and nursing note reviewed. Exam conducted with a chaperone present.  Constitutional:      General: She is not in acute distress.    Appearance: She is obese. She is not ill-appearing, toxic-appearing or  diaphoretic.  HENT:     Head: Normocephalic.     Right Ear: Tympanic membrane, ear canal and external ear normal.     Left Ear: Tympanic membrane, ear canal and external ear normal.     Nose: Nose normal.     Mouth/Throat:     Mouth: Mucous membranes are dry.     Pharynx: Oropharynx is clear.  Eyes:     Extraocular Movements: Extraocular movements intact.     Conjunctiva/sclera: Conjunctivae normal.     Pupils: Pupils are equal, round, and reactive to light.  Neck:     Thyroid: No thyroid mass, thyromegaly or thyroid tenderness.  Cardiovascular:     Rate and Rhythm: Normal rate and regular rhythm.     Pulses: Normal pulses.     Heart sounds: Normal heart sounds. No murmur heard.    No friction rub. No gallop.  Pulmonary:     Effort: Pulmonary effort is normal.     Breath sounds: Normal breath sounds.  Abdominal:     General: Bowel sounds are normal. There is no distension.     Palpations: Abdomen is soft. There is no mass.     Tenderness: There is no abdominal tenderness. There is no guarding.  Genitourinary:    General: Normal vulva.     Exam position: Lithotomy position.     Vagina: Normal.     Cervix: Normal.     Uterus: Normal.      Adnexa: Right adnexa normal and left adnexa normal.  Musculoskeletal:        General: No swelling or tenderness. Normal range of motion.     Cervical back: Normal range of motion and neck supple. No tenderness.     Left lower leg: No edema.  Skin:    General: Skin is warm and dry.     Capillary Refill: Capillary refill takes less than 2 seconds.     Findings: No lesion or rash.  Neurological:     General: No focal deficit present.     Mental Status: She is alert and oriented to person, place, and time.     Cranial Nerves: No cranial nerve deficit.     Motor: No weakness.     Gait: Gait normal.  Psychiatric:        Mood and Affect: Mood normal.        Behavior: Behavior  normal.        Thought Content: Thought content normal.         Judgment: Judgment normal.      No results found for any visits on 12/03/22.     Assessment & Plan:    Routine Health Maintenance and Physical Exam  Barbara [name] was seen today for annual exam.  Diagnoses and all orders for this visit:  Routine general medical examination at a health care facility She had fasting screening labs done 3 months ago.   Encounter for gynecological examination with Papanicolaou smear of cervix -     Cytology - PAP([place])   Immunization History  Administered Date(s) Administered   Influenza,inj,Quad PF,6+ Mos 01/16/2018, 11/26/2018   Influenza-Unspecified 01/16/2018   Tdap 11/26/2018    Health Maintenance  Topic Date Due   MAMMOGRAM  01/30/2021   INFLUENZA VACCINE  05/18/2023 (Originally 09/18/2022)   Hepatitis C Screening  10/06/2023 (Originally 12/17/1991)   HIV Screening  10/06/2023 (Originally 12/16/1988)   COVID-19 Vaccine (1 - 2023-24 season) 12/19/2023 (Originally 10/19/2022)   Cervical Cancer Screening (HPV/Pap Cotest)  01/06/2024   DTaP/Tdap/Td (2 - Td or Tdap) 11/25/2028   Colonoscopy  11/28/2030   HPV VACCINES  Aged Out    Discussed health benefits of physical activity, and encouraged her to engage in regular exercise appropriate for her age and condition.  Problem List Items Addressed This Visit       Other   Encounter for gynecological examination with Papanicolaou smear of cervix   Relevant Orders   Cytology - PAP([place])   Other Visit Diagnoses     Routine general medical examination at a health care facility    -  Primary      Return in about 1 year (around 12/03/2023) for CPE.   The patient indicates understanding of these issues and agrees with the plan.  Gabriel Earing, FNP

## 2022-12-03 NOTE — Patient Instructions (Signed)

## 2022-12-03 NOTE — Telephone Encounter (Signed)
Paperwork completed and patient notified. Copy at front desk for patient as well. Other copy placed in chart and faxed to company.

## 2022-12-04 ENCOUNTER — Telehealth: Payer: Self-pay

## 2022-12-04 LAB — CYTOLOGY - PAP
Adequacy: ABSENT
Comment: NEGATIVE
Diagnosis: NEGATIVE
High risk HPV: NEGATIVE

## 2022-12-04 NOTE — Telephone Encounter (Signed)
FMLA paper work needs Scientist, clinical (histocompatibility and immunogenetics).  Dr. Ronne Binning needs to fill out the duration of leave on question 6.  Please re fax to [phone]

## 2022-12-04 NOTE — Telephone Encounter (Signed)
Paperwork refaxed with appropriate documentation.

## 2022-12-10 ENCOUNTER — Other Ambulatory Visit: Payer: Self-pay | Admitting: Family Medicine

## 2022-12-10 ENCOUNTER — Encounter: Payer: Self-pay | Admitting: Family Medicine

## 2022-12-10 DIAGNOSIS — Z1231 Encounter for screening mammogram for malignant neoplasm of breast: Secondary | ICD-10-CM

## 2022-12-11 ENCOUNTER — Other Ambulatory Visit: Payer: Self-pay | Admitting: Family Medicine

## 2022-12-11 DIAGNOSIS — B001 Herpesviral vesicular dermatitis: Secondary | ICD-10-CM

## 2022-12-11 MED ORDER — VALACYCLOVIR HCL 1 G PO TABS
1000.0000 mg | ORAL_TABLET | Freq: Two times a day (BID) | ORAL | 0 refills | Status: DC
Start: 2022-12-11 — End: 2023-01-14

## 2022-12-12 ENCOUNTER — Ambulatory Visit
Admission: RE | Admit: 2022-12-12 | Discharge: 2022-12-12 | Disposition: A | Discharge status: Home / Self Care | Payer: No Typology Code available for payment source | Source: Ambulatory Visit | Attending: Family Medicine

## 2022-12-12 DIAGNOSIS — Z1231 Encounter for screening mammogram for malignant neoplasm of breast: Secondary | ICD-10-CM

## 2022-12-26 ENCOUNTER — Ambulatory Visit (HOSPITAL_COMMUNITY)
Admission: RE | Admit: 2022-12-26 | Discharge: 2022-12-26 | Disposition: A | Discharge status: Home / Self Care | Payer: No Typology Code available for payment source | Source: Ambulatory Visit | Attending: Urology | Admitting: Urology

## 2022-12-26 DIAGNOSIS — N200 Calculus of kidney: Secondary | ICD-10-CM | POA: Diagnosis present

## 2023-01-02 ENCOUNTER — Ambulatory Visit: Payer: No Typology Code available for payment source | Admitting: Urology

## 2023-01-02 VITALS — BP 118/74 | HR 72

## 2023-01-02 DIAGNOSIS — N200 Calculus of kidney: Secondary | ICD-10-CM

## 2023-01-02 LAB — MICROSCOPIC EXAMINATION: Bacteria, UA: NONE SEEN

## 2023-01-02 LAB — URINALYSIS, ROUTINE W REFLEX MICROSCOPIC
Bilirubin, UA: NEGATIVE
Glucose, UA: NEGATIVE
Ketones, UA: NEGATIVE
Nitrite, UA: NEGATIVE
Protein,UA: NEGATIVE
RBC, UA: NEGATIVE
Specific Gravity, UA: 1.015 (ref 1.005–1.030)
Urobilinogen, Ur: 0.2 mg/dL (ref 0.2–1.0)
pH, UA: 7.5 (ref 5.0–7.5)

## 2023-01-02 MED ORDER — SODIUM BICARBONATE 650 MG PO TABS
650.0000 mg | ORAL_TABLET | Freq: Two times a day (BID) | ORAL | 11 refills | Status: DC
Start: 2023-01-02 — End: 2023-05-06

## 2023-01-02 NOTE — Progress Notes (Unsigned)
01/02/2023 12:56 PM   Barbara [name] 1975/10/18 [id number]  Referring provider: Gabriel Earing, FNP [address] [place],  [place] [address]  No chief complaint on file.   HPI: Renal US shows resolution of right hydronephrosis. She has bilateral 4-69mm calculi. She has intermittent bilaterla flank pain. She is on sodium bicarb 650mg  BID and pH is 7.5    PMH: Past Medical History:  Diagnosis Date   Anxiety    Depression    History of palpitations 2011   work-up with cardiologist with event monitor, showed no arrhythmias   History of panic attacks    Migraine headache    Morbid obesity (HCC)    PONV (postoperative nausea and vomiting)     Surgical History: Past Surgical History:  Procedure Laterality Date   COLONOSCOPY WITH PROPOFOL N/A 11/27/2020   Procedure: COLONOSCOPY WITH PROPOFOL;  Surgeon: Lanelle Bal, DO;  Location: [facility];  Service: Endoscopy;  Laterality: N/A;  9:30 / ASA II   D & C HYSTEROSCOPY W/ ENDOMETRIAL ABLATION  01-29-2005   dr Despina Hidden  @[facility]    ELBOW SURGERY Left    ESOPHAGOGASTRODUODENOSCOPY N/A 05/18/2014   SLF: 1. Mild esophaigitis. 2. Mild non-erosive gastritis.    LAPAROSCOPIC GASTRIC SLEEVE RESECTION N/A 12/22/2017   Procedure: LAPAROSCOPIC GASTRIC SLEEVE RESECTION, UPPER ENDO, ERAS PATHWAY;  Surgeon: Berna Bue, MD;  Location: [facility];  Service: General;  Laterality: N/A;   ORIF RADIUS & ULNA FRACTURES Left 11-01-2006   dr Melvyn Novas @[facility]    ULNAR NERVE TRANSPOSITION Left 2011   AND REMOVAL HARDWARE OF WRIST    Home Medications:  Allergies as of 01/02/2023       Reactions   Prednisone Other (See Comments)   BRADYCARDIA-heart rate below 40 for longer that 10 minutes   Sulfamethoxazole-trimethoprim Nausea And Vomiting, Rash   Ibuprofen    Had gastric sleeve   Penicillins Hives   Tolerates cephalosporins. Has patient had a PCN reaction causing immediate rash, facial/tongue/throat swelling, SOB or lightheadedness with  hypotension: No Has patient had a PCN reaction causing severe rash involving mucus membranes or skin necrosis: No Has patient had a PCN reaction that required hospitalization: No Has patient had a PCN reaction occurring within the last 10 years: No If all of the above answers are "NO", then may proceed with Cephalosporin use.   Tape Other (See Comments)   PAPER TAPE OK; RED AND RAW SKIN & SKIN TEARS   Latex Rash   Some gloves cause red and raw skin. Skin peels off.         Medication List        Accurate as of January 02, 2023 12:56 PM. If you have any questions, ask your nurse or doctor.          ALPRAZolam 0.5 MG tablet Commonly known as: XANAX Take 0.25 mg by mouth daily as needed for anxiety.   Bariatric Fusion Chew Chew 1 tablet by mouth in the morning. NO IRON   CALCIUM/D3 ADULT GUMMIES PO Take 1 tablet by mouth 3 (three) times daily.   cetirizine 10 MG tablet Commonly known as: ZYRTEC Take 10 mg by mouth daily.   eletriptan 40 MG tablet Commonly known as: RELPAX Take 1 tablet (40 mg total) by mouth as needed for migraine or headache. May repeat in 2 hours if headache persists or recurs.   escitalopram 10 MG tablet Commonly known as: LEXAPRO Take 1 tablet (10 mg total) by mouth daily.  meloxicam 7.5 MG tablet Commonly known as: MOBIC Take 7.5 mg by mouth daily.   ondansetron 4 MG tablet Commonly known as: Zofran Take 1 tablet (4 mg total) by mouth every 8 (eight) hours as needed for nausea or vomiting.   oxyCODONE-acetaminophen 5-325 MG tablet Commonly known as: PERCOCET/ROXICET Take 1 tablet by mouth every 6 (six) hours as needed for severe pain.   sodium bicarbonate 650 MG tablet Take 1 tablet (650 mg total) by mouth 2 (two) times daily.        Allergies:  Allergies  Allergen Reactions   Prednisone Other (See Comments)    BRADYCARDIA-heart rate below 40 for longer that 10 minutes   Sulfamethoxazole-Trimethoprim Nausea And Vomiting and  Rash   Ibuprofen     Had gastric sleeve   Penicillins Hives    Tolerates cephalosporins. Has patient had a PCN reaction causing immediate rash, facial/tongue/throat swelling, SOB or lightheadedness with hypotension: No Has patient had a PCN reaction causing severe rash involving mucus membranes or skin necrosis: No Has patient had a PCN reaction that required hospitalization: No Has patient had a PCN reaction occurring within the last 10 years: No If all of the above answers are "NO", then may proceed with Cephalosporin use.    Tape Other (See Comments)    PAPER TAPE OK; RED AND RAW SKIN & SKIN TEARS   Latex Rash    Some gloves cause red and raw skin. Skin peels off.     Family History: Family History  Problem Relation Age of Onset   Arthritis Mother    Depression Father    Arthritis Father    Anxiety disorder Father    Hypertension Father    Hyperlipidemia Father    Diabetes Father    Crohn's disease Daughter    Heart disease Paternal Grandmother    Arthritis Paternal Grandmother    Diabetes Paternal Grandmother    Hyperlipidemia Paternal Grandmother    Hypertension Paternal Grandmother    Heart attack Paternal Grandmother    Hypertension Paternal Grandfather    Arthritis Paternal Grandfather    Colon cancer Neg Hx    Breast cancer Neg Hx     Social History:  reports that she has never smoked. She has never used smokeless tobacco. She reports that she does not drink alcohol and does not use drugs.  ROS: All other review of systems were reviewed and are negative except what is noted above in HPI  Physical Exam: BP 118/74   Pulse 72   Constitutional:  Alert and oriented, No acute distress. HEENT: [place] AT, moist mucus membranes.  Trachea midline, no masses. Cardiovascular: No clubbing, cyanosis, or edema. Respiratory: Normal respiratory effort, no increased work of breathing. GI: Abdomen is soft, nontender, nondistended, no abdominal masses GU: No CVA tenderness.   Lymph: No cervical or inguinal lymphadenopathy. Skin: No rashes, bruises or suspicious lesions. Neurologic: Grossly intact, no focal deficits, moving all 4 extremities. Psychiatric: Normal mood and affect.  Laboratory Data: Lab Results  Component Value Date   WBC 11.4 (H) 11/27/2022   HGB 14.5 11/27/2022   HCT 42.5 11/27/2022   MCV 89.9 11/27/2022   PLT 341 11/27/2022    Lab Results  Component Value Date   CREATININE 0.92 11/27/2022    No results found for: "PSA"  No results found for: "TESTOSTERONE"  Lab Results  Component Value Date   HGBA1C 5.4 08/28/2022    Urinalysis    Component Value Date/Time   COLORURINE YELLOW 11/27/2022 0220  APPEARANCEUR Clear 11/28/2022 0944   LABSPEC 1.020 11/27/2022 0220   PHURINE 5.0 11/27/2022 0220   GLUCOSEU Negative 11/28/2022 0944   HGBUR LARGE (A) 11/27/2022 0220   HGBUR 3+ 12/07/2010 1558   BILIRUBINUR Negative 11/28/2022 0944   KETONESUR NEGATIVE 11/27/2022 0220   PROTEINUR Negative 11/28/2022 0944   PROTEINUR 100 (A) 11/27/2022 0220   UROBILINOGEN 0.2 04/16/2014 1150   NITRITE Negative 11/28/2022 0944   NITRITE NEGATIVE 11/27/2022 0220   LEUKOCYTESUR Negative 11/28/2022 0944   LEUKOCYTESUR NEGATIVE 11/27/2022 0220    Lab Results  Component Value Date   LABMICR See below: 11/28/2022   WBCUA 0-5 11/28/2022   LABEPIT 0-10 11/28/2022   BACTERIA Few (A) 11/28/2022    Pertinent Imaging: *** Results for orders placed in visit on 11/27/22  Abdomen 1 view (KUB)  Narrative CLINICAL DATA:  Kidney stone  EXAM: ABDOMEN - 1 VIEW  COMPARISON:  CT 11/27/2022.  FINDINGS: The bowel gas pattern is normal. Stones observed on CT are not well visualized on plain film. Barely perceptible calcific density could represent the known left-sided stone overlying the lower pole of the left kidney. Follow up with CT recommended.  IMPRESSION: Nephrolithiasis observed on CT is not well visualized by plain film. Consider follow  up with CT.   Electronically Signed By: Layla Maw M.D. On: 12/17/2022 15:14  No results found for this or any previous visit.  No results found for this or any previous visit.  No results found for this or any previous visit.  Results for orders placed during the hospital encounter of 12/26/22  Ultrasound renal complete  Narrative CLINICAL DATA:  Nephrolithiasis.  EXAM: RENAL / URINARY TRACT ULTRASOUND COMPLETE  COMPARISON:  CT abdomen pelvis November 27, 2022  FINDINGS: Right Kidney:  Renal measurements: 11 x 4.8 x 5.4 cm = volume: 149 mL. Stones identified within the right kidney, largest measures 4 mm in the midpole. Echogenicity within normal limits. No mass or hydronephrosis visualized.  Left Kidney:  Renal measurements: 10.5 x 6.2 x 6 cm = volume: 206 mL. 5 mm lower pole left kidney stone. Echogenicity within normal limits. No mass or hydronephrosis visualized.  Bladder:  Appears normal for degree of bladder distention.  Other:  None.  IMPRESSION: Bilateral nonobstructive renal stones. No hydronephrosis.   Electronically Signed By: Sherian Rein M.D. On: 12/26/2022 10:49  No valid procedures specified. No results found for this or any previous visit.  Results for orders placed during the hospital encounter of 11/27/22  CT RENAL STONE STUDY  Narrative CLINICAL DATA:  Abdominal/flank pain, stone suspected RLQ abdominal pain. Pain started intermittently Tuesday night but pt states that pain awoke her from sleep and now is intense and constant. Pt 10/10. Endorses nausea.  EXAM: CT ABDOMEN AND PELVIS WITHOUT CONTRAST  TECHNIQUE: Multidetector CT imaging of the abdomen and pelvis was performed following the standard protocol without IV contrast.  RADIATION DOSE REDUCTION: This exam was performed according to the departmental dose-optimization program which includes automated exposure control, adjustment of the mA and/or kV according  to patient size and/or use of iterative reconstruction technique.  COMPARISON:  CT abdomen pelvis 04/16/2014  FINDINGS: Lower chest: Tiny hiatal hernia.  No acute abnormality.  Hepatobiliary: No focal liver abnormality. No gallstones, gallbladder wall thickening, or pericholecystic fluid. No biliary dilatation.  Pancreas: No focal lesion. Normal pancreatic contour. No surrounding inflammatory changes. No main pancreatic ductal dilatation.  Spleen: Normal in size without focal abnormality.  Adrenals/Urinary Tract:  No adrenal nodule  bilaterally.  4 mm right and punctate left nephrolithiasis. No ureterolithiasis bilaterally. Mild hydronephrosis of the right kidney. No left nephroureterolithiasis.  The urinary bladder is unremarkable.  Stomach/Bowel: Gastric sleeve surgical changes noted. Stomach is within normal limits. No evidence of bowel wall thickening or dilatation. Colonic diverticulosis. Appendix appears normal.  Vascular/Lymphatic: No abdominal aorta or iliac aneurysm. No abdominal, pelvic, or inguinal lymphadenopathy.  Reproductive: Uterus and bilateral adnexa are unremarkable.  Other: No intraperitoneal free fluid. No intraperitoneal free gas. No organized fluid collection.  Musculoskeletal:  No abdominal wall hernia or abnormality.  No suspicious lytic or blastic osseous lesions. No acute displaced fracture.  IMPRESSION: 1. Indeterminate etiology of mild right hydronephrosis. This may reflect the residual of a recently passed calculus or reflect chronic changes of obstructive uropathy or reflux. 2. Bilateral nonobstructive nephrolithiasis measuring up to 4 mm on the right and punctate on the left. 3. Colonic diverticulosis with no acute diverticulitis. 4. Tiny hiatal hernia in the setting of gastric sleeve surgical changes.   Electronically Signed By: Tish Frederickson M.D. On: 11/27/2022 02:26   Assessment & Plan:    1. Kidney stones Continue  observation, followup 3 months with renal US - Urinalysis, Routine w reflex microscopic - Ultrasound renal complete; Future - sodium bicarbonate 650 MG tablet; Take 1 tablet (650 mg total) by mouth daily.  Dispense: 30 tablet; Refill: 11   No follow-ups on file.  [name], MD  [facility]

## 2023-01-06 ENCOUNTER — Encounter: Payer: Self-pay | Admitting: Urology

## 2023-01-06 NOTE — Patient Instructions (Signed)

## 2023-01-12 ENCOUNTER — Encounter: Payer: Self-pay | Admitting: Family Medicine

## 2023-01-13 ENCOUNTER — Ambulatory Visit (INDEPENDENT_AMBULATORY_CARE_PROVIDER_SITE_OTHER): Payer: No Typology Code available for payment source | Admitting: Family Medicine

## 2023-01-13 ENCOUNTER — Encounter: Payer: Self-pay | Admitting: Family Medicine

## 2023-01-13 ENCOUNTER — Other Ambulatory Visit: Payer: Self-pay | Admitting: Family Medicine

## 2023-01-13 VITALS — BP 142/102 | HR 82 | Temp 98.1°F | Ht 66.0 in | Wt 233.0 lb

## 2023-01-13 DIAGNOSIS — G43909 Migraine, unspecified, not intractable, without status migrainosus: Secondary | ICD-10-CM

## 2023-01-13 DIAGNOSIS — F418 Other specified anxiety disorders: Secondary | ICD-10-CM

## 2023-01-13 MED ORDER — HYDROXYZINE PAMOATE 25 MG PO CAPS
25.0000 mg | ORAL_CAPSULE | Freq: Three times a day (TID) | ORAL | 0 refills | Status: DC | PRN
Start: 2023-01-13 — End: 2023-01-13

## 2023-01-13 MED ORDER — ONDANSETRON HCL 4 MG PO TABS
4.0000 mg | ORAL_TABLET | Freq: Three times a day (TID) | ORAL | 0 refills | Status: DC | PRN
Start: 2023-01-13 — End: 2023-04-28

## 2023-01-13 NOTE — Progress Notes (Signed)
Subjective:  Patient ID: Barbara [name], female    DOB: [birth year], 47 y.o.   MRN: [id number]  Patient Care Team: Gabriel Earing, FNP as PCP - General (Family Medicine) West Bali, MD (Inactive) as Consulting Physician (Gastroenterology) Lazaro Arms, MD as Consulting Physician (Obstetrics and Gynecology)   Chief Complaint:  Depression (DUE TO FATHER PASSING)   HPI: Barbara [name] is a 47 y.o. female presenting on 01/13/2023 for Depression (DUE TO FATHER PASSING)   Discussed the use of AI scribe software for clinical note transcription with the patient, who gave verbal consent to proceed.  History of Present Illness   The patient, with a history of anxiety, presents in a state of acute distress following the recent death of their father. The patient reports that their father passed away suddenly, which has led to a significant increase in their anxiety symptoms. They have been unable to sleep since the event and are struggling to cope with the emotional burden. The patient has a history of panic attacks, for which they were previously prescribed Xanax PRN, but they have not taken this medication for several years. They are currently on Lexapro, which they refer to as their "crazy pill," and have been on this medication for an extended period.  The patient is also dealing with the emotional needs of their family, including an autistic son and a brother's son, both of whom are having a hard time coping with the loss. The patient feels the responsibility to support their family members, which is adding to their stress.  The patient's father was reportedly septic at the time of his death, but refused to go to the hospital. The patient feels guilt over this, as they had promised their father that they would take him to the hospital. The patient's father was 34 at the time of his death, and the patient is struggling with the suddenness of the loss.  The patient is seeking help to  manage their acute anxiety symptoms in the wake of this traumatic event. They are open to trying new medications, as long as they do not have addictive properties. The patient is not seeking drugs, but rather assistance in managing their heightened anxiety during this difficult time.         01/13/2023    9:40 AM 12/03/2022    8:20 AM 10/06/2022    1:31 PM 08/28/2022    7:58 AM 07/23/2022    2:48 PM  Depression screen PHQ 2/9  Decreased Interest 1 1 0 0 1  Down, Depressed, Hopeless 1 0 0 1 1  PHQ - 2 Score 2 1 0 1 2  Altered sleeping 1 0 0 0 0  Tired, decreased energy 1 0 0 0 0  Change in appetite 1 0 0 0 1  Feeling bad or failure about yourself  1 0 0 0 1  Trouble concentrating 1 0 0 0 0  Moving slowly or fidgety/restless 0 0 0 0 0  Suicidal thoughts 0 0 0 0 0  PHQ-9 Score 7 1 0 1 4  Difficult doing work/chores  Not difficult at all Not difficult at all Not difficult at all Not difficult at all      01/13/2023    9:40 AM 12/03/2022    8:19 AM 10/06/2022    1:28 PM 08/28/2022    8:00 AM  GAD 7 : Generalized Anxiety Score  Nervous, Anxious, on Edge 1 0 1 1  Control/stop worrying 1  1 0 0  Worry too much - different things 1 0 0 0  Trouble relaxing 1 0 0 0  Restless 1 0 0 0  Easily annoyed or irritable 0 0 0 0  Afraid - awful might happen 1 1 0 0  Total GAD 7 Score 6 2 1 1   Anxiety Difficulty Not difficult at all Not difficult at all Not difficult at all Not difficult at all        Relevant past medical, surgical, family, and social history reviewed and updated as indicated.  Allergies and medications reviewed and updated. Data reviewed: Chart in Epic.   Past Medical History:  Diagnosis Date   Anxiety    Depression    History of palpitations 2011   work-up with cardiologist with event monitor, showed no arrhythmias   History of panic attacks    Migraine headache    Morbid obesity (HCC)    PONV (postoperative nausea and vomiting)     Past Surgical History:   Procedure Laterality Date   COLONOSCOPY WITH PROPOFOL N/A 11/27/2020   Procedure: COLONOSCOPY WITH PROPOFOL;  Surgeon: Lanelle Bal, DO;  Location: [facility];  Service: Endoscopy;  Laterality: N/A;  9:30 / ASA II   D & C HYSTEROSCOPY W/ ENDOMETRIAL ABLATION  01-29-2005   dr Despina Hidden  @[facility]    ELBOW SURGERY Left    ESOPHAGOGASTRODUODENOSCOPY N/A 05/18/2014   SLF: 1. Mild esophaigitis. 2. Mild non-erosive gastritis.    LAPAROSCOPIC GASTRIC SLEEVE RESECTION N/A 12/22/2017   Procedure: LAPAROSCOPIC GASTRIC SLEEVE RESECTION, UPPER ENDO, ERAS PATHWAY;  Surgeon: Berna Bue, MD;  Location: [facility];  Service: General;  Laterality: N/A;   ORIF RADIUS & ULNA FRACTURES Left 11-01-2006   dr Melvyn Novas @[facility]    ULNAR NERVE TRANSPOSITION Left 2011   AND REMOVAL HARDWARE OF WRIST    Social History   Socioeconomic History   Marital status: Married    Spouse name: Casimiro Needle   Number of children: 2   Years of education: 14   Highest education level: Some college, no degree  Occupational History   Not on file  Tobacco Use   Smoking status: Never   Smokeless tobacco: Never  Vaping Use   Vaping status: Never Used  Substance and Sexual Activity   Alcohol use: No   Drug use: No   Sexual activity: Yes    Birth control/protection: Other-see comments    Comment: vasectomy; ablation  Other Topics Concern   Not on file  Social History Narrative   Not on file   Social Determinants of Health   Financial Resource Strain: Low Risk  (12/03/2022)   Overall Financial Resource Strain (CARDIA)    Difficulty of Paying Living Expenses: Not very hard  Food Insecurity: No Food Insecurity (12/03/2022)   Hunger Vital Sign    Worried About Running Out of Food in the Last Year: Never true    Ran Out of Food in the Last Year: Never true  Transportation Needs: No Transportation Needs (12/03/2022)   PRAPARE - Administrator, Civil Service (Medical): No    Lack of Transportation (Non-Medical): No   Physical Activity: Insufficiently Active (12/03/2022)   Exercise Vital Sign    Days of Exercise per Week: 1 day    Minutes of Exercise per Session: 10 min  Stress: Not on file (12/26/2022)  Recent Concern: Stress - Stress Concern Present (12/03/2022)   [employer] of Occupational Health - Occupational Stress Questionnaire    Feeling of Stress :  To some extent  Social Connections: Moderately Integrated (12/03/2022)   Social Connection and Isolation Panel [NHANES]    Frequency of Communication with Friends and Family: More than three times a week    Frequency of Social Gatherings with Friends and Family: Twice a week    Attends Religious Services: More than 4 times per year    Active Member of [employer] or Organizations: No    Attends Engineer, structural: Not on file    Marital Status: Married  Catering manager Violence: Low Risk  (03/02/2020)   Received from [employer] [facility]   Intimate Partner Violence    Insults You: Not on file    Threatens You: Not on file    Screams at [place]: Not on file    Physically Hurt: Not on file    Intimate Partner Violence Score: Not on file    Outpatient Encounter Medications as of 01/13/2023  Medication Sig   Calcium-Phosphorus-Vitamin D (CALCIUM/D3 ADULT GUMMIES PO) Take 1 tablet by mouth 3 (three) times daily.   cetirizine (ZYRTEC) 10 MG tablet Take 10 mg by mouth daily.   eletriptan (RELPAX) 40 MG tablet Take 1 tablet (40 mg total) by mouth as needed for migraine or headache. May repeat in 2 hours if headache persists or recurs.   escitalopram (LEXAPRO) 10 MG tablet Take 1 tablet (10 mg total) by mouth daily.   hydrOXYzine (VISTARIL) 25 MG capsule Take 1-2 capsules (25-50 mg total) by mouth every 8 (eight) hours as needed for anxiety.   meloxicam (MOBIC) 7.5 MG tablet Take 7.5 mg by mouth daily.   Multiple Vitamins-Minerals (BARIATRIC FUSION) CHEW Chew 1 tablet by mouth in the morning. NO IRON   sodium bicarbonate 650 MG  tablet Take 1 tablet (650 mg total) by mouth 2 (two) times daily.   [DISCONTINUED] ondansetron (ZOFRAN) 4 MG tablet Take 1 tablet (4 mg total) by mouth every 8 (eight) hours as needed for nausea or vomiting.   ALPRAZolam (XANAX) 0.5 MG tablet Take 0.25 mg by mouth daily as needed for anxiety. (Patient not taking: Reported on 01/13/2023)   ondansetron (ZOFRAN) 4 MG tablet Take 1 tablet (4 mg total) by mouth every 8 (eight) hours as needed for nausea or vomiting.   oxyCODONE-acetaminophen (PERCOCET/ROXICET) 5-325 MG tablet Take 1 tablet by mouth every 6 (six) hours as needed for severe pain. (Patient not taking: Reported on 01/13/2023)   No facility-administered encounter medications on file as of 01/13/2023.    Allergies  Allergen Reactions   Prednisone Other (See Comments)    BRADYCARDIA-heart rate below 40 for longer that 10 minutes   Sulfamethoxazole-Trimethoprim Nausea And Vomiting and Rash   Ibuprofen     Had gastric sleeve   Penicillins Hives    Tolerates cephalosporins. Has patient had a PCN reaction causing immediate rash, facial/tongue/throat swelling, SOB or lightheadedness with hypotension: No Has patient had a PCN reaction causing severe rash involving mucus membranes or skin necrosis: No Has patient had a PCN reaction that required hospitalization: No Has patient had a PCN reaction occurring within the last 10 years: No If all of the above answers are "NO", then may proceed with Cephalosporin use.    Tape Other (See Comments)    PAPER TAPE OK; RED AND RAW SKIN & SKIN TEARS   Latex Rash    Some gloves cause red and raw skin. Skin peels off.     Pertinent ROS per HPI, otherwise unremarkable      Objective:  BP (!) 142/102   Pulse 82   Temp 98.1 F (36.7 C) (Temporal)   Ht 5\' 6"  (1.676 m)   Wt 233 lb (105.7 kg)   SpO2 95%   BMI 37.61 kg/m    Wt Readings from Last 3 Encounters:  01/13/23 233 lb (105.7 kg)  12/03/22 229 lb 6 oz (104 kg)  11/27/22 230 lb (104.3  kg)    Physical Exam Vitals and nursing note reviewed.  Constitutional:      General: She is not in acute distress.    Appearance: She is obese. She is not ill-appearing, toxic-appearing or diaphoretic.  HENT:     Head: Normocephalic and atraumatic.     Nose: Nose normal.     Mouth/Throat:     Mouth: Mucous membranes are moist.  Eyes:     Conjunctiva/sclera: Conjunctivae normal.     Pupils: Pupils are equal, round, and reactive to light.  Cardiovascular:     Rate and Rhythm: Normal rate.  Pulmonary:     Effort: Pulmonary effort is normal.  Skin:    General: Skin is warm and dry.     Capillary Refill: Capillary refill takes less than 2 seconds.  Neurological:     General: No focal deficit present.     Mental Status: She is alert and oriented to person, place, and time.  Psychiatric:        Attention and Perception: Attention and perception normal.        Mood and Affect: Affect is tearful.        Speech: Speech normal.        Behavior: Behavior normal. Behavior is cooperative.        Thought Content: Thought content normal.        Cognition and Memory: Cognition and memory normal.        Judgment: Judgment normal.      Results for orders placed or performed in visit on 01/02/23  Microscopic Examination   Urine  Result Value Ref Range   WBC, UA 6-10 (A) 0 - 5 /hpf   RBC, Urine 0-2 0 - 2 /hpf   Epithelial Cells (non renal) 0-10 0 - 10 /hpf   Bacteria, UA None seen None seen/Few  Urinalysis, Routine w reflex microscopic  Result Value Ref Range   Specific Gravity, UA 1.015 1.005 - 1.030   pH, UA 7.5 5.0 - 7.5   Color, UA Yellow Yellow   Appearance Ur Clear Clear   Leukocytes,UA Trace (A) Negative   Protein,UA Negative Negative/Trace   Glucose, UA Negative Negative   Ketones, UA Negative Negative   RBC, UA Negative Negative   Bilirubin, UA Negative Negative   Urobilinogen, Ur 0.2 0.2 - 1.0 mg/dL   Nitrite, UA Negative Negative   Microscopic Examination See below:         Pertinent labs & imaging results that were available during my care of the patient were reviewed by me and considered in my medical decision making.  Assessment & Plan:  Jonte was seen today for depression.  Diagnoses and all orders for this visit:  Situational anxiety -     hydrOXYzine (VISTARIL) 25 MG capsule; Take 1-2 capsules (25-50 mg total) by mouth every 8 (eight) hours as needed for anxiety.  Migraine without status migrainosus, not intractable, unspecified migraine type -     ondansetron (ZOFRAN) 4 MG tablet; Take 1 tablet (4 mg total) by mouth every 8 (eight) hours as needed for nausea or vomiting.  Assessment and Plan    Acute Anxiety Experiencing acute anxiety exacerbated by recent traumatic events, including the murder of a family member and the death of their father. Anxiety managed with Lexapro; previously used Xanax PRN for panic attacks. Significant distress and difficulty coping with current events. Discussed hydroxyzine as an alternative to Xanax for acute anxiety due to its non-addictive properties and lower sedation risk. Informed about potential side effects and advised to start with one tablet to assess effectiveness. - Prescribe hydroxyzine, 1-2 tablets as needed for acute anxiety - Advise to start with one tablet and take an additional tablet if needed - Instruct to call if hydroxyzine is not effective - Encourage seeking support from family, pastor, or counselor  General Health Maintenance No specific health maintenance issues discussed. - Continue Lexapro as prescribed  Follow-up - Call the clinic by end of day tomorrow if hydroxyzine is not effective - Inform that Elmarie Shiley will be available tomorrow for further assistance.          Continue all other maintenance medications.  Follow up plan: Return if symptoms worsen or fail to improve.   Continue healthy lifestyle choices, including diet (rich in fruits, vegetables, and lean  proteins, and low in salt and simple carbohydrates) and exercise (at least 30 minutes of moderate physical activity daily).  Educational handout given for crisis intervention hotline numbers  The above assessment and management plan was discussed with the patient. The patient verbalized understanding of and has agreed to the management plan. Patient is aware to call the clinic if they develop any new symptoms or if symptoms persist or worsen. Patient is aware when to return to the clinic for a follow-up visit. Patient educated on when it is appropriate to go to the emergency department.   Kari Baars, FNP-C [facility] [phone]

## 2023-01-13 NOTE — Patient Instructions (Addendum)
If your symptoms worsen or you have thoughts of suicide/homicide, PLEASE SEEK IMMEDIATE MEDICAL ATTENTION.  You may always call the National Suicide Hotline.  This is available 24 hours a day, 7 days a week.  Their number is: (928)467-3704  National Suicide Hotline: 860-538-2312 [place] Crisis Line: (831) 826-1833 Crisis Recovery in [place]: 332-203-6378  These are available 24 hours a day, 7 days a week.

## 2023-01-14 ENCOUNTER — Other Ambulatory Visit: Payer: Self-pay | Admitting: Family Medicine

## 2023-01-14 DIAGNOSIS — B001 Herpesviral vesicular dermatitis: Secondary | ICD-10-CM

## 2023-01-14 MED ORDER — VALACYCLOVIR HCL 1 G PO TABS: 1000.0000 mg | ORAL_TABLET | Freq: Two times a day (BID) | ORAL | 1 refills | Status: AC

## 2023-01-31 ENCOUNTER — Other Ambulatory Visit: Payer: Self-pay | Admitting: Family Medicine

## 2023-01-31 DIAGNOSIS — F418 Other specified anxiety disorders: Secondary | ICD-10-CM

## 2023-03-26 ENCOUNTER — Encounter: Payer: Self-pay | Admitting: Urology

## 2023-03-27 ENCOUNTER — Ambulatory Visit (HOSPITAL_COMMUNITY)
Admission: RE | Admit: 2023-03-27 | Discharge: 2023-03-27 | Disposition: A | Discharge status: Home / Self Care | Payer: No Typology Code available for payment source | Source: Ambulatory Visit | Attending: Urology | Admitting: Urology

## 2023-03-27 DIAGNOSIS — N200 Calculus of kidney: Secondary | ICD-10-CM | POA: Insufficient documentation

## 2023-04-03 ENCOUNTER — Ambulatory Visit: Payer: No Typology Code available for payment source | Admitting: Urology

## 2023-04-08 ENCOUNTER — Ambulatory Visit: Payer: No Typology Code available for payment source | Admitting: Urology

## 2023-04-08 VITALS — BP 119/83 | HR 79

## 2023-04-08 DIAGNOSIS — N200 Calculus of kidney: Secondary | ICD-10-CM

## 2023-04-08 LAB — URINALYSIS, ROUTINE W REFLEX MICROSCOPIC
Bilirubin, UA: NEGATIVE
Glucose, UA: NEGATIVE
Ketones, UA: NEGATIVE
Leukocytes,UA: NEGATIVE
Nitrite, UA: NEGATIVE
Protein,UA: NEGATIVE
RBC, UA: NEGATIVE
Specific Gravity, UA: 1.01 (ref 1.005–1.030)
Urobilinogen, Ur: 0.2 mg/dL (ref 0.2–1.0)
pH, UA: 7 (ref 5.0–7.5)

## 2023-04-08 NOTE — H&P (View-Only) (Signed)
 04/08/2023 11:03 AM   Barbara [name] March 26, 1975 [id number]  Referring provider: Gabriel Earing, FNP [address] [place],  [place] [address]  Followup nephrolithiasis   HPI: Barbara [name] is a 47yo here for followup for nephrolithiasis. No stone events since last visit. She has a 4mm right and 5mm left renal calculus which is stable. She is on sodium bicarb.    PMH: Past Medical History:  Diagnosis Date   Anxiety    Depression    History of palpitations 2011   work-up with cardiologist with event monitor, showed no arrhythmias   History of panic attacks    Migraine headache    Morbid obesity (HCC)    PONV (postoperative nausea and vomiting)     Surgical History: Past Surgical History:  Procedure Laterality Date   COLONOSCOPY WITH PROPOFOL N/A 11/27/2020   Procedure: COLONOSCOPY WITH PROPOFOL;  Surgeon: Barbara Bal, DO;  Location: [facility];  Service: Endoscopy;  Laterality: N/A;  9:30 / ASA II   D & C HYSTEROSCOPY W/ ENDOMETRIAL ABLATION  01-29-2005   dr Barbara [name]  @[facility]    ELBOW SURGERY Left    ESOPHAGOGASTRODUODENOSCOPY N/A 05/18/2014   SLF: 1. Mild esophaigitis. 2. Mild non-erosive gastritis.    LAPAROSCOPIC GASTRIC SLEEVE RESECTION N/A 12/22/2017   Procedure: LAPAROSCOPIC GASTRIC SLEEVE RESECTION, UPPER ENDO, ERAS PATHWAY;  Surgeon: Barbara Bue, MD;  Location: [facility];  Service: General;  Laterality: N/A;   ORIF RADIUS & ULNA FRACTURES Left 11-01-2006   dr Barbara [name] @[facility]    ULNAR NERVE TRANSPOSITION Left 2011   AND REMOVAL HARDWARE OF WRIST    Home Medications:  Allergies as of 04/08/2023       Reactions   Prednisone Other (See Comments)   BRADYCARDIA-heart rate below 40 for longer that 10 minutes   Sulfamethoxazole-trimethoprim Nausea And Vomiting, Rash   Ibuprofen    Had gastric sleeve   Penicillins Hives   Tolerates cephalosporins. Has patient had a PCN reaction causing immediate rash, facial/tongue/throat swelling, SOB or lightheadedness with  hypotension: No Has patient had a PCN reaction causing severe rash involving mucus membranes or skin necrosis: No Has patient had a PCN reaction that required hospitalization: No Has patient had a PCN reaction occurring within the last 10 years: No If all of the above answers are "NO", then may proceed with Cephalosporin use.   Tape Other (See Comments)   PAPER TAPE OK; RED AND RAW SKIN & SKIN TEARS   Latex Rash   Some gloves cause red and raw skin. Skin peels off.         Medication List        Accurate as of April 08, 2023 11:03 AM. If you have any questions, ask your nurse or doctor.          ALPRAZolam 0.5 MG tablet Commonly known as: XANAX Take 0.25 mg by mouth daily as needed for anxiety.   Bariatric Fusion Chew Chew 1 tablet by mouth in the morning. NO IRON   CALCIUM/D3 ADULT GUMMIES PO Take 1 tablet by mouth 3 (three) times daily.   cetirizine 10 MG tablet Commonly known as: ZYRTEC Take 10 mg by mouth daily.   eletriptan 40 MG tablet Commonly known as: RELPAX Take 1 tablet (40 mg total) by mouth as needed for migraine or headache. May repeat in 2 hours if headache persists or recurs.   escitalopram 10 MG tablet Commonly known as: LEXAPRO Take 1 tablet (10 mg total) by mouth daily.  hydrOXYzine 25 MG capsule Commonly known as: VISTARIL TAKE 1-2 CAPSULES (25-50 MG TOTAL) BY MOUTH EVERY 8 (EIGHT) HOURS AS NEEDED FOR ANXIETY.   meloxicam 7.5 MG tablet Commonly known as: MOBIC Take 7.5 mg by mouth daily.   ondansetron 4 MG tablet Commonly known as: Zofran Take 1 tablet (4 mg total) by mouth every 8 (eight) hours as needed for nausea or vomiting.   oxyCODONE-acetaminophen 5-325 MG tablet Commonly known as: PERCOCET/ROXICET Take 1 tablet by mouth every 6 (six) hours as needed for severe pain.   sodium bicarbonate 650 MG tablet Take 1 tablet (650 mg total) by mouth 2 (two) times daily.        Allergies:  Allergies  Allergen Reactions    Prednisone Other (See Comments)    BRADYCARDIA-heart rate below 40 for longer that 10 minutes   Sulfamethoxazole-Trimethoprim Nausea And Vomiting and Rash   Ibuprofen     Had gastric sleeve   Penicillins Hives    Tolerates cephalosporins. Has patient had a PCN reaction causing immediate rash, facial/tongue/throat swelling, SOB or lightheadedness with hypotension: No Has patient had a PCN reaction causing severe rash involving mucus membranes or skin necrosis: No Has patient had a PCN reaction that required hospitalization: No Has patient had a PCN reaction occurring within the last 10 years: No If all of the above answers are "NO", then may proceed with Cephalosporin use.    Tape Other (See Comments)    PAPER TAPE OK; RED AND RAW SKIN & SKIN TEARS   Latex Rash    Some gloves cause red and raw skin. Skin peels off.     Family History: Family History  Problem Relation Age of Onset   Arthritis Mother    Depression Father    Arthritis Father    Anxiety disorder Father    Hypertension Father    Hyperlipidemia Father    Diabetes Father    Crohn's disease Daughter    Heart disease Paternal Grandmother    Arthritis Paternal Grandmother    Diabetes Paternal Grandmother    Hyperlipidemia Paternal Grandmother    Hypertension Paternal Grandmother    Heart attack Paternal Grandmother    Hypertension Paternal Grandfather    Arthritis Paternal Grandfather    Colon cancer Neg Hx    Breast cancer Neg Hx     Social History:  reports that she has never smoked. She has never used smokeless tobacco. She reports that she does not drink alcohol and does not use drugs.  ROS: All other review of systems were reviewed and are negative except what is noted above in HPI  Physical Exam: BP 119/83   Pulse 79   Constitutional:  Alert and oriented, No acute distress. HEENT: Barbara [name] AT, moist mucus membranes.  Trachea midline, no masses. Cardiovascular: No clubbing, cyanosis, or edema. Respiratory:  Normal respiratory effort, no increased work of breathing. GI: Abdomen is soft, nontender, nondistended, no abdominal masses GU: No CVA tenderness.  Lymph: No cervical or inguinal lymphadenopathy. Skin: No rashes, bruises or suspicious lesions. Neurologic: Grossly intact, no focal deficits, moving all 4 extremities. Psychiatric: Normal mood and affect.  Laboratory Data: Lab Results  Component Value Date   WBC 11.4 (H) 11/27/2022   HGB 14.5 11/27/2022   HCT 42.5 11/27/2022   MCV 89.9 11/27/2022   PLT 341 11/27/2022    Lab Results  Component Value Date   CREATININE 0.92 11/27/2022    No results found for: "PSA"  No results found for: "TESTOSTERONE"  Lab  Results  Component Value Date   HGBA1C 5.4 08/28/2022    Urinalysis    Component Value Date/Time   COLORURINE YELLOW 11/27/2022 0220   APPEARANCEUR Clear 01/02/2023 1246   LABSPEC 1.020 11/27/2022 0220   PHURINE 5.0 11/27/2022 0220   GLUCOSEU Negative 01/02/2023 1246   HGBUR LARGE (A) 11/27/2022 0220   HGBUR 3+ 12/07/2010 1558   BILIRUBINUR Negative 01/02/2023 1246   KETONESUR NEGATIVE 11/27/2022 0220   PROTEINUR Negative 01/02/2023 1246   PROTEINUR 100 (A) 11/27/2022 0220   UROBILINOGEN 0.2 04/16/2014 1150   NITRITE Negative 01/02/2023 1246   NITRITE NEGATIVE 11/27/2022 0220   LEUKOCYTESUR Trace (A) 01/02/2023 1246   LEUKOCYTESUR NEGATIVE 11/27/2022 0220    Lab Results  Component Value Date   LABMICR See below: 01/02/2023   WBCUA 6-10 (A) 01/02/2023   LABEPIT 0-10 01/02/2023   BACTERIA None seen 01/02/2023    Pertinent Imaging: Renal US 03/27/2023: Images reviewed and discussed with the patient  Results for orders placed in visit on 11/27/22  Abdomen 1 view (KUB)  Narrative CLINICAL DATA:  Kidney stone  EXAM: ABDOMEN - 1 VIEW  COMPARISON:  CT 11/27/2022.  FINDINGS: The bowel gas pattern is normal. Stones observed on CT are not well visualized on plain film. Barely perceptible calcific density  could represent the known left-sided stone overlying the lower pole of the left kidney. Follow up with CT recommended.  IMPRESSION: Nephrolithiasis observed on CT is not well visualized by plain film. Consider follow up with CT.   Electronically Signed By: Layla Maw M.D. On: 12/17/2022 15:14  No results found for this or any previous visit.  No results found for this or any previous visit.  No results found for this or any previous visit.  Results for orders placed during the hospital encounter of 03/27/23  Ultrasound renal complete  Narrative : PROCEDURE: US RENAL  HISTORY: Patient is a 48 y/o  F with nephrolithiasis.  COMPARISON: U/S renal 12/26/2022, CT renal 11/27/2022.  TECHNIQUE: Two-dimensional grayscale and color Doppler ultrasound of the kidneys was performed.  FINDINGS: The urinary bladder demonstrates normal anechoic echogenicity. The bilateral ureteral jets are visualized.  The right kidney measures 10.9 x 7.2 x 6.3 cm. Renal cortical echotexture is within normal limits. There is no hydronephrosis. There are multiple small non-obstructing stones identified throughout the right kidney. There are no cysts.  The left kidney measures 10.4 x 5.4 x 5.0 cm. Renal cortical echotexture is within normal limits. There is no hydronephrosis. There are multiple small non-obstructing stones identified throughout the left kidney. There are no cysts.  IMPRESSION: 1. Multiple bilateral renal calculi. No significant change from prior imaging dating back to November 2024.  Thank you for allowing [place] to assist in the care of this patient.   Electronically Signed By: Lestine Box M.D. On: 03/27/2023 17:24  No results found for this or any previous visit.  No results found for this or any previous visit.  Results for orders placed during the hospital encounter of 11/27/22  CT RENAL STONE STUDY  Narrative CLINICAL DATA:  Abdominal/flank pain, stone  suspected RLQ abdominal pain. Pain started intermittently Tuesday night but pt states that pain awoke her from sleep and now is intense and constant. Pt 10/10. Endorses nausea.  EXAM: CT ABDOMEN AND PELVIS WITHOUT CONTRAST  TECHNIQUE: Multidetector CT imaging of the abdomen and pelvis was performed following the standard protocol without IV contrast.  RADIATION DOSE REDUCTION: This exam was performed according to the departmental dose-optimization  program which includes automated exposure control, adjustment of the mA and/or kV according to patient size and/or use of iterative reconstruction technique.  COMPARISON:  CT abdomen pelvis 04/16/2014  FINDINGS: Lower chest: Tiny hiatal hernia.  No acute abnormality.  Hepatobiliary: No focal liver abnormality. No gallstones, gallbladder wall thickening, or pericholecystic fluid. No biliary dilatation.  Pancreas: No focal lesion. Normal pancreatic contour. No surrounding inflammatory changes. No main pancreatic ductal dilatation.  Spleen: Normal in size without focal abnormality.  Adrenals/Urinary Tract:  No adrenal nodule bilaterally.  4 mm right and punctate left nephrolithiasis. No ureterolithiasis bilaterally. Mild hydronephrosis of the right kidney. No left nephroureterolithiasis.  The urinary bladder is unremarkable.  Stomach/Bowel: Gastric sleeve surgical changes noted. Stomach is within normal limits. No evidence of bowel wall thickening or dilatation. Colonic diverticulosis. Appendix appears normal.  Vascular/Lymphatic: No abdominal aorta or iliac aneurysm. No abdominal, pelvic, or inguinal lymphadenopathy.  Reproductive: Uterus and bilateral adnexa are unremarkable.  Other: No intraperitoneal free fluid. No intraperitoneal free gas. No organized fluid collection.  Musculoskeletal:  No abdominal wall hernia or abnormality.  No suspicious lytic or blastic osseous lesions. No acute  displaced fracture.  IMPRESSION: 1. Indeterminate etiology of mild right hydronephrosis. This may reflect the residual of a recently passed calculus or reflect chronic changes of obstructive uropathy or reflux. 2. Bilateral nonobstructive nephrolithiasis measuring up to 4 mm on the right and punctate on the left. 3. Colonic diverticulosis with no acute diverticulitis. 4. Tiny hiatal hernia in the setting of gastric sleeve surgical changes.   Electronically Signed By: Tish Frederickson M.D. On: 11/27/2022 02:26   Assessment & Plan:    1. Nephrolithiasis (Primary) -We discussed the management of kidney stones. These options include observation, ureteroscopy, shockwave lithotripsy (ESWL) and percutaneous nephrolithotomy (PCNL). We discussed which options are relevant to the patient's stone(s). We discussed the natural history of kidney stones as well as the complications of untreated stones and the impact on quality of life without treatment as well as with each of the above listed treatments. We also discussed the efficacy of each treatment in its ability to clear the stone burden. With any of these management options I discussed the signs and symptoms of infection and the need for emergent treatment should these be experienced. For each option we discussed the ability of each procedure to clear the patient of their stone burden.   For observation I described the risks which include but are not limited to silent renal damage, life-threatening infection, need for emergent surgery, failure to pass stone and pain.   For ureteroscopy I described the risks which include bleeding, infection, damage to contiguous structures, positioning injury, ureteral stricture, ureteral avulsion, ureteral injury, need for prolonged ureteral stent, inability to perform ureteroscopy, need for an interval procedure, inability to clear stone burden, stent discomfort/pain, heart attack, stroke, pulmonary embolus and the  inherent risks with general anesthesia.   For shockwave lithotripsy I described the risks which include arrhythmia, kidney contusion, kidney hemorrhage, need for transfusion, pain, inability to adequately break up stone, inability to pass stone fragments, Steinstrasse, infection associated with obstructing stones, need for alternate surgical procedure, need for repeat shockwave lithotripsy, MI, CVA, PE and the inherent risks with anesthesia/conscious sedation.   For PCNL I described the risks including positioning injury, pneumothorax, hydrothorax, need for chest tube, inability to clear stone burden, renal laceration, arterial venous fistula or malformation, need for embolization of kidney, loss of kidney or renal function, need for repeat procedure, need for prolonged  nephrostomy tube, ureteral avulsion, MI, CVA, PE and the inherent risks of general anesthesia.   - The patient would like to proceed with bilateral ureteroscopic stone extraction - Urinalysis, Routine w reflex microscopic   No follow-ups on file.  [name], MD  [facility]

## 2023-04-08 NOTE — Progress Notes (Signed)
 04/08/2023 11:03 AM   WAUNETTA RIGGLE March 26, 1975 [id number]  Referring provider: Gabriel Earing, FNP [address] [place],  [place] [address]  Followup nephrolithiasis   HPI: [name] is a 48yo here for followup for nephrolithiasis. No stone events since last visit. She has a 4mm right and 5mm left renal calculus which is stable. She is on sodium bicarb.    PMH: Past Medical History:  Diagnosis Date   Anxiety    Depression    History of palpitations 2011   work-up with cardiologist with event monitor, showed no arrhythmias   History of panic attacks    Migraine headache    Morbid obesity (HCC)    PONV (postoperative nausea and vomiting)     Surgical History: Past Surgical History:  Procedure Laterality Date   COLONOSCOPY WITH PROPOFOL N/A 11/27/2020   Procedure: COLONOSCOPY WITH PROPOFOL;  Surgeon: Lanelle Bal, DO;  Location: [facility];  Service: Endoscopy;  Laterality: N/A;  9:30 / ASA II   D & C HYSTEROSCOPY W/ ENDOMETRIAL ABLATION  01-29-2005   dr Despina Hidden  @[facility]    ELBOW SURGERY Left    ESOPHAGOGASTRODUODENOSCOPY N/A 05/18/2014   SLF: 1. Mild esophaigitis. 2. Mild non-erosive gastritis.    LAPAROSCOPIC GASTRIC SLEEVE RESECTION N/A 12/22/2017   Procedure: LAPAROSCOPIC GASTRIC SLEEVE RESECTION, UPPER ENDO, ERAS PATHWAY;  Surgeon: Berna Bue, MD;  Location: [facility];  Service: General;  Laterality: N/A;   ORIF RADIUS & ULNA FRACTURES Left 11-01-2006   dr Melvyn Novas @[facility]    ULNAR NERVE TRANSPOSITION Left 2011   AND REMOVAL HARDWARE OF WRIST    Home Medications:  Allergies as of 04/08/2023       Reactions   Prednisone Other (See Comments)   BRADYCARDIA-heart rate below 40 for longer that 10 minutes   Sulfamethoxazole-trimethoprim Nausea And Vomiting, Rash   Ibuprofen    Had gastric sleeve   Penicillins Hives   Tolerates cephalosporins. Has patient had a PCN reaction causing immediate rash, facial/tongue/throat swelling, SOB or lightheadedness with  hypotension: No Has patient had a PCN reaction causing severe rash involving mucus membranes or skin necrosis: No Has patient had a PCN reaction that required hospitalization: No Has patient had a PCN reaction occurring within the last 10 years: No If all of the above answers are "NO", then may proceed with Cephalosporin use.   Tape Other (See Comments)   PAPER TAPE OK; RED AND RAW SKIN & SKIN TEARS   Latex Rash   Some gloves cause red and raw skin. Skin peels off.         Medication List        Accurate as of April 08, 2023 11:03 AM. If you have any questions, ask your nurse or doctor.          ALPRAZolam 0.5 MG tablet Commonly known as: XANAX Take 0.25 mg by mouth daily as needed for anxiety.   Bariatric Fusion Chew Chew 1 tablet by mouth in the morning. NO IRON   CALCIUM/D3 ADULT GUMMIES PO Take 1 tablet by mouth 3 (three) times daily.   cetirizine 10 MG tablet Commonly known as: ZYRTEC Take 10 mg by mouth daily.   eletriptan 40 MG tablet Commonly known as: RELPAX Take 1 tablet (40 mg total) by mouth as needed for migraine or headache. May repeat in 2 hours if headache persists or recurs.   escitalopram 10 MG tablet Commonly known as: LEXAPRO Take 1 tablet (10 mg total) by mouth daily.  hydrOXYzine 25 MG capsule Commonly known as: VISTARIL TAKE 1-2 CAPSULES (25-50 MG TOTAL) BY MOUTH EVERY 8 (EIGHT) HOURS AS NEEDED FOR ANXIETY.   meloxicam 7.5 MG tablet Commonly known as: MOBIC Take 7.5 mg by mouth daily.   ondansetron 4 MG tablet Commonly known as: Zofran Take 1 tablet (4 mg total) by mouth every 8 (eight) hours as needed for nausea or vomiting.   oxyCODONE-acetaminophen 5-325 MG tablet Commonly known as: PERCOCET/ROXICET Take 1 tablet by mouth every 6 (six) hours as needed for severe pain.   sodium bicarbonate 650 MG tablet Take 1 tablet (650 mg total) by mouth 2 (two) times daily.        Allergies:  Allergies  Allergen Reactions    Prednisone Other (See Comments)    BRADYCARDIA-heart rate below 40 for longer that 10 minutes   Sulfamethoxazole-Trimethoprim Nausea And Vomiting and Rash   Ibuprofen     Had gastric sleeve   Penicillins Hives    Tolerates cephalosporins. Has patient had a PCN reaction causing immediate rash, facial/tongue/throat swelling, SOB or lightheadedness with hypotension: No Has patient had a PCN reaction causing severe rash involving mucus membranes or skin necrosis: No Has patient had a PCN reaction that required hospitalization: No Has patient had a PCN reaction occurring within the last 10 years: No If all of the above answers are "NO", then may proceed with Cephalosporin use.    Tape Other (See Comments)    PAPER TAPE OK; RED AND RAW SKIN & SKIN TEARS   Latex Rash    Some gloves cause red and raw skin. Skin peels off.     Family History: Family History  Problem Relation Age of Onset   Arthritis Mother    Depression Father    Arthritis Father    Anxiety disorder Father    Hypertension Father    Hyperlipidemia Father    Diabetes Father    Crohn's disease Daughter    Heart disease Paternal Grandmother    Arthritis Paternal Grandmother    Diabetes Paternal Grandmother    Hyperlipidemia Paternal Grandmother    Hypertension Paternal Grandmother    Heart attack Paternal Grandmother    Hypertension Paternal Grandfather    Arthritis Paternal Grandfather    Colon cancer Neg Hx    Breast cancer Neg Hx     Social History:  reports that she has never smoked. She has never used smokeless tobacco. She reports that she does not drink alcohol and does not use drugs.  ROS: All other review of systems were reviewed and are negative except what is noted above in HPI  Physical Exam: BP 119/83   Pulse 79   Constitutional:  Alert and oriented, No acute distress. HEENT: [place] AT, moist mucus membranes.  Trachea midline, no masses. Cardiovascular: No clubbing, cyanosis, or edema. Respiratory:  Normal respiratory effort, no increased work of breathing. GI: Abdomen is soft, nontender, nondistended, no abdominal masses GU: No CVA tenderness.  Lymph: No cervical or inguinal lymphadenopathy. Skin: No rashes, bruises or suspicious lesions. Neurologic: Grossly intact, no focal deficits, moving all 4 extremities. Psychiatric: Normal mood and affect.  Laboratory Data: Lab Results  Component Value Date   WBC 11.4 (H) 11/27/2022   HGB 14.5 11/27/2022   HCT 42.5 11/27/2022   MCV 89.9 11/27/2022   PLT 341 11/27/2022    Lab Results  Component Value Date   CREATININE 0.92 11/27/2022    No results found for: "PSA"  No results found for: "TESTOSTERONE"  Lab  Results  Component Value Date   HGBA1C 5.4 08/28/2022    Urinalysis    Component Value Date/Time   COLORURINE YELLOW 11/27/2022 0220   APPEARANCEUR Clear 01/02/2023 1246   LABSPEC 1.020 11/27/2022 0220   PHURINE 5.0 11/27/2022 0220   GLUCOSEU Negative 01/02/2023 1246   HGBUR LARGE (A) 11/27/2022 0220   HGBUR 3+ 12/07/2010 1558   BILIRUBINUR Negative 01/02/2023 1246   KETONESUR NEGATIVE 11/27/2022 0220   PROTEINUR Negative 01/02/2023 1246   PROTEINUR 100 (A) 11/27/2022 0220   UROBILINOGEN 0.2 04/16/2014 1150   NITRITE Negative 01/02/2023 1246   NITRITE NEGATIVE 11/27/2022 0220   LEUKOCYTESUR Trace (A) 01/02/2023 1246   LEUKOCYTESUR NEGATIVE 11/27/2022 0220    Lab Results  Component Value Date   LABMICR See below: 01/02/2023   WBCUA 6-10 (A) 01/02/2023   LABEPIT 0-10 01/02/2023   BACTERIA None seen 01/02/2023    Pertinent Imaging: Renal US 03/27/2023: Images reviewed and discussed with the patient  Results for orders placed in visit on 11/27/22  Abdomen 1 view (KUB)  Narrative CLINICAL DATA:  Kidney stone  EXAM: ABDOMEN - 1 VIEW  COMPARISON:  CT 11/27/2022.  FINDINGS: The bowel gas pattern is normal. Stones observed on CT are not well visualized on plain film. Barely perceptible calcific density  could represent the known left-sided stone overlying the lower pole of the left kidney. Follow up with CT recommended.  IMPRESSION: Nephrolithiasis observed on CT is not well visualized by plain film. Consider follow up with CT.   Electronically Signed By: Layla Maw M.D. On: 12/17/2022 15:14  No results found for this or any previous visit.  No results found for this or any previous visit.  No results found for this or any previous visit.  Results for orders placed during the hospital encounter of 03/27/23  Ultrasound renal complete  Narrative : PROCEDURE: US RENAL  HISTORY: Patient is a 48 y/o  F with nephrolithiasis.  COMPARISON: U/S renal 12/26/2022, CT renal 11/27/2022.  TECHNIQUE: Two-dimensional grayscale and color Doppler ultrasound of the kidneys was performed.  FINDINGS: The urinary bladder demonstrates normal anechoic echogenicity. The bilateral ureteral jets are visualized.  The right kidney measures 10.9 x 7.2 x 6.3 cm. Renal cortical echotexture is within normal limits. There is no hydronephrosis. There are multiple small non-obstructing stones identified throughout the right kidney. There are no cysts.  The left kidney measures 10.4 x 5.4 x 5.0 cm. Renal cortical echotexture is within normal limits. There is no hydronephrosis. There are multiple small non-obstructing stones identified throughout the left kidney. There are no cysts.  IMPRESSION: 1. Multiple bilateral renal calculi. No significant change from prior imaging dating back to November 2024.  Thank you for allowing [place] to assist in the care of this patient.   Electronically Signed By: Lestine Box M.D. On: 03/27/2023 17:24  No results found for this or any previous visit.  No results found for this or any previous visit.  Results for orders placed during the hospital encounter of 11/27/22  CT RENAL STONE STUDY  Narrative CLINICAL DATA:  Abdominal/flank pain, stone  suspected RLQ abdominal pain. Pain started intermittently Tuesday night but pt states that pain awoke her from sleep and now is intense and constant. Pt 10/10. Endorses nausea.  EXAM: CT ABDOMEN AND PELVIS WITHOUT CONTRAST  TECHNIQUE: Multidetector CT imaging of the abdomen and pelvis was performed following the standard protocol without IV contrast.  RADIATION DOSE REDUCTION: This exam was performed according to the departmental dose-optimization  program which includes automated exposure control, adjustment of the mA and/or kV according to patient size and/or use of iterative reconstruction technique.  COMPARISON:  CT abdomen pelvis 04/16/2014  FINDINGS: Lower chest: Tiny hiatal hernia.  No acute abnormality.  Hepatobiliary: No focal liver abnormality. No gallstones, gallbladder wall thickening, or pericholecystic fluid. No biliary dilatation.  Pancreas: No focal lesion. Normal pancreatic contour. No surrounding inflammatory changes. No main pancreatic ductal dilatation.  Spleen: Normal in size without focal abnormality.  Adrenals/Urinary Tract:  No adrenal nodule bilaterally.  4 mm right and punctate left nephrolithiasis. No ureterolithiasis bilaterally. Mild hydronephrosis of the right kidney. No left nephroureterolithiasis.  The urinary bladder is unremarkable.  Stomach/Bowel: Gastric sleeve surgical changes noted. Stomach is within normal limits. No evidence of bowel wall thickening or dilatation. Colonic diverticulosis. Appendix appears normal.  Vascular/Lymphatic: No abdominal aorta or iliac aneurysm. No abdominal, pelvic, or inguinal lymphadenopathy.  Reproductive: Uterus and bilateral adnexa are unremarkable.  Other: No intraperitoneal free fluid. No intraperitoneal free gas. No organized fluid collection.  Musculoskeletal:  No abdominal wall hernia or abnormality.  No suspicious lytic or blastic osseous lesions. No acute  displaced fracture.  IMPRESSION: 1. Indeterminate etiology of mild right hydronephrosis. This may reflect the residual of a recently passed calculus or reflect chronic changes of obstructive uropathy or reflux. 2. Bilateral nonobstructive nephrolithiasis measuring up to 4 mm on the right and punctate on the left. 3. Colonic diverticulosis with no acute diverticulitis. 4. Tiny hiatal hernia in the setting of gastric sleeve surgical changes.   Electronically Signed By: Tish Frederickson M.D. On: 11/27/2022 02:26   Assessment & Plan:    1. Nephrolithiasis (Primary) -We discussed the management of kidney stones. These options include observation, ureteroscopy, shockwave lithotripsy (ESWL) and percutaneous nephrolithotomy (PCNL). We discussed which options are relevant to the patient's stone(s). We discussed the natural history of kidney stones as well as the complications of untreated stones and the impact on quality of life without treatment as well as with each of the above listed treatments. We also discussed the efficacy of each treatment in its ability to clear the stone burden. With any of these management options I discussed the signs and symptoms of infection and the need for emergent treatment should these be experienced. For each option we discussed the ability of each procedure to clear the patient of their stone burden.   For observation I described the risks which include but are not limited to silent renal damage, life-threatening infection, need for emergent surgery, failure to pass stone and pain.   For ureteroscopy I described the risks which include bleeding, infection, damage to contiguous structures, positioning injury, ureteral stricture, ureteral avulsion, ureteral injury, need for prolonged ureteral stent, inability to perform ureteroscopy, need for an interval procedure, inability to clear stone burden, stent discomfort/pain, heart attack, stroke, pulmonary embolus and the  inherent risks with general anesthesia.   For shockwave lithotripsy I described the risks which include arrhythmia, kidney contusion, kidney hemorrhage, need for transfusion, pain, inability to adequately break up stone, inability to pass stone fragments, Steinstrasse, infection associated with obstructing stones, need for alternate surgical procedure, need for repeat shockwave lithotripsy, MI, CVA, PE and the inherent risks with anesthesia/conscious sedation.   For PCNL I described the risks including positioning injury, pneumothorax, hydrothorax, need for chest tube, inability to clear stone burden, renal laceration, arterial venous fistula or malformation, need for embolization of kidney, loss of kidney or renal function, need for repeat procedure, need for prolonged  nephrostomy tube, ureteral avulsion, MI, CVA, PE and the inherent risks of general anesthesia.   - The patient would like to proceed with bilateral ureteroscopic stone extraction - Urinalysis, Routine w reflex microscopic   No follow-ups on file.  [name], MD  [facility]

## 2023-04-13 NOTE — Telephone Encounter (Signed)
 Can you please forward FMLA paper work to me when it comes through.

## 2023-04-13 NOTE — Telephone Encounter (Signed)
 Can you confirm we do not edit FMLA paperwork, needs to be faxed over for MD to complete.

## 2023-04-14 ENCOUNTER — Encounter: Payer: Self-pay | Admitting: Urology

## 2023-04-14 NOTE — Patient Instructions (Signed)

## 2023-04-16 NOTE — Telephone Encounter (Signed)
 FMLA paper work received.  Please advise on intermittent leave post surgery.

## 2023-04-17 NOTE — Patient Instructions (Signed)
 Barbara [name]  04/17/2023     @PREFPERIOPPHARMACY @   Your procedure is scheduled on  04/23/2023.   Report to [facility] at  1130 A.M.   Call this number if you have problems the morning of surgery:  [phone]  If you experience any cold or flu symptoms such as cough, fever, chills, shortness of breath, etc. between now and your scheduled surgery, please notify us at the above number.   Remember:  Do not eat after midnight.    You may drink clear liquids until 0930 am on 04/23/2023.    Clear liquids allowed are:                    Water, Juice (No red color; non-citric and without pulp; diabetics please choose diet or no sugar options), Carbonated beverages (diabetics please choose diet or no sugar options), Clear Tea (No creamer, milk, or cream, including half & half and powdered creamer), Black Coffee Only (No creamer, milk or cream, including half & half and powdered creamer), and Clear Sports drink (No red color; diabetics please choose diet or no sugar options)    Take these medicines the morning of surgery with A SIP OF WATER          eletriptan(if needed), oxycodone(if needed), zofran (if needed).     Do not wear jewelry, make-up or nail polish, including gel polish,  artificial nails, or any other type of covering on natural nails (fingers and  toes).  Do not wear lotions, powders, or perfumes, or deodorant.  Do not shave 48 hours prior to surgery.  Men may shave face and neck.  Do not bring valuables to the hospital.  [facility] is not responsible for any belongings or valuables.  Contacts, dentures or bridgework may not be worn into surgery.  Leave your suitcase in the car.  After surgery it may be brought to your room.  For patients admitted to the hospital, discharge time will be determined by your treatment team.  Patients discharged the day of surgery will not be allowed to drive home and must have someone with them for 24 hours.    Special  instructions:   DO NOT smoke tobacco or vape for 24 hours before your procedure.  Please read over the following fact sheets that you were given. Coughing and Deep Breathing, Surgical Site Infection Prevention, Anesthesia Post-op Instructions, and Care and Recovery After Surgery        Ureteral Stent Implantation, Care After The following information offers guidance on how to care for yourself after your procedure. Your health care provider may also give you more specific instructions. If you have problems or questions, contact your health care provider. What can I expect after the procedure? After the procedure, it is common to have: Nausea. Mild pain when you urinate. You may feel this pain in your lower back or lower abdomen. The pain should stop within a few minutes after you urinate. This pattern may last for up to 1 week. A small amount of blood in your urine for several days. Follow these instructions at home: Medicines Take over-the-counter and prescription medicines only as told by your health care provider. If you were prescribed antibiotics, take them as told by your health care provider. Do not stop using the antibiotic even if you start to feel better. If you were given a sedative during the procedure, it can affect you for several hours. Do not drive  or operate machinery until your health care provider says that it is safe. Ask your health care provider if the medicine prescribed to you: Requires you to avoid driving or using machinery. Can cause constipation. You may need to take these actions to prevent or treat constipation: Take over-the-counter or prescription medicines. Eat foods that are high in fiber, such as beans, whole grains, and fresh fruits and vegetables. Limit foods that are high in fat and processed sugars, such as fried or sweet foods. Activity Rest as told by your health care provider. Do not sit for a long time without moving. Get up to take short walks  every 1-2 hours. This will improve blood flow and breathing. Ask for help if you feel weak or unsteady. Return to your normal activities as told by your health care provider. Ask your health care provider what activities are safe for you. General instructions  If you have a catheter: Follow instructions from your health care provider about taking care of your catheter and collection bag. Do not take baths, swim, or use a hot tub until your health care provider approves. Ask your health care provider if you may take showers. You may only be allowed to take sponge baths. Drink enough fluid to keep your urine pale yellow. Do not use any products that contain nicotine or tobacco. These products include cigarettes, chewing tobacco, and vaping devices, such as e-cigarettes. These can delay healing after surgery. If you need help quitting, ask your health care provider. Keep all follow-up visits. Contact a health care provider if: You start passing blood clots, or you have more than a small amount of blood in your urine. You have pain that gets worse or does not get better with medicine, especially pain when you urinate. You have trouble urinating. You feel nauseous or you vomit again and again during a period of more than 2 days after the procedure. You have a fever. Get help right away if: You are passing blood clots that are 1 inch (2.5 cm) or larger in size. You are leaking urine (have incontinence), or you cannot urinate. The end of the stent comes out of your urethra. You have sudden, sharp, or severe pain in your abdomen or lower back. You have swelling or pain in your legs. You have trouble breathing. These symptoms may be an emergency. Get help right away. Call 911. Do not wait to see if the symptoms will go away. Do not drive yourself to the hospital. Summary After the procedure, it is common to have mild pain when you urinate that goes away within a few minutes after you urinate. This  may last for up to 1 week. Take over-the-counter and prescription medicines only as told by your health care provider. Drink enough fluid to keep your urine pale yellow. Call your health care provider if you start passing blood clots, or you have more than a small amount of blood in your urine. This information is not intended to replace advice given to you by your health care provider. Make sure you discuss any questions you have with your health care provider. Document Revised: 03/11/2021 Document Reviewed: 03/11/2021 Elsevier Patient Education  2024 [employer]General Anesthesia, Adult, Care After The following information offers guidance on how to care for yourself after your procedure. Your health care provider may also give you more specific instructions. If you have problems or questions, contact your health care provider. What can I expect after the procedure? After the procedure, it is  common for people to: Have pain or discomfort at the IV site. Have nausea or vomiting. Have a sore throat or hoarseness. Have trouble concentrating. Feel cold or chills. Feel weak, sleepy, or tired (fatigue). Have soreness and body aches. These can affect parts of the body that were not involved in surgery. Follow these instructions at home: For the time period you were told by your health care provider:  Rest. Do not participate in activities where you could fall or become injured. Do not drive or use machinery. Do not drink alcohol. Do not take sleeping pills or medicines that cause drowsiness. Do not make important decisions or sign legal documents. Do not take care of children on your own. General instructions Drink enough fluid to keep your urine pale yellow. If you have sleep apnea, surgery and certain medicines can increase your risk for breathing problems. Follow instructions from your health care provider about wearing your sleep device: Anytime you are sleeping, including during  daytime naps. While taking prescription pain medicines, sleeping medicines, or medicines that make you drowsy. Return to your normal activities as told by your health care provider. Ask your health care provider what activities are safe for you. Take over-the-counter and prescription medicines only as told by your health care provider. Do not use any products that contain nicotine or tobacco. These products include cigarettes, chewing tobacco, and vaping devices, such as e-cigarettes. These can delay incision healing after surgery. If you need help quitting, ask your health care provider. Contact a health care provider if: You have nausea or vomiting that does not get better with medicine. You vomit every time you eat or drink. You have pain that does not get better with medicine. You cannot urinate or have bloody urine. You develop a skin rash. You have a fever. Get help right away if: You have trouble breathing. You have chest pain. You vomit blood. These symptoms may be an emergency. Get help right away. Call 911. Do not wait to see if the symptoms will go away. Do not drive yourself to the hospital. Summary After the procedure, it is common to have a sore throat, hoarseness, nausea, vomiting, or to feel weak, sleepy, or fatigue. For the time period you were told by your health care provider, do not drive or use machinery. Get help right away if you have difficulty breathing, have chest pain, or vomit blood. These symptoms may be an emergency. This information is not intended to replace advice given to you by your health care provider. Make sure you discuss any questions you have with your health care provider. Document Revised: 05/03/2021 Document Reviewed: 05/03/2021 Elsevier Patient Education  2024 [employer].How to Use Chlorhexidine at Home in the Shower Chlorhexidine gluconate (CHG) is a germ-killing (antiseptic) wash that's used to clean the skin. It can get rid of the germs  that normally live on the skin and can keep them away for about 24 hours. If you're having surgery, you may be told to shower with CHG at home the night before surgery. This can help lower your risk for infection. To use CHG wash in the shower, follow the steps below. Supplies needed: CHG body wash. Clean washcloth. Clean towel. How to use CHG in the shower Follow these steps unless you're told to use CHG in a different way: Start the shower. Use your normal soap and shampoo to wash your face and hair. Turn off the shower or move out of the shower stream. Pour CHG onto a  clean washcloth. Do not use any type of brush or rough sponge. Start at your neck, washing your body down to your toes. Make sure you: Wash the part of your body where the surgery will be done for at least 1 minute. Do not scrub. Do not use CHG on your head or face unless your health care provider tells you to. If it gets into your ears or eyes, rinse them well with water. Do not wash your genitals with CHG. Wash your back and under your arms. Make sure to wash skin folds. Let the CHG sit on your skin for 1-2 minutes or as long as told. Rinse your entire body in the shower, including all body creases and folds. Turn off the shower. Dry off with a clean towel. Do not put anything on your skin afterward, such as powder, lotion, or perfume. Put on clean clothes or pajamas. If it's the night before surgery, sleep in clean sheets. General tips Use CHG only as told, and follow the instructions on the label. Use the full amount of CHG as told. This is often one bottle. Do not smoke and stay away from flames after using CHG. Your skin may feel sticky after using CHG. This is normal. The sticky feeling will go away as the CHG dries. Do not use CHG: If you have a chlorhexidine allergy or have reacted to chlorhexidine in the past. On open wounds or areas of skin that have broken skin, cuts, or scrapes. On babies younger than 67  months of age. Contact a health care provider if: You have questions about using CHG. Your skin gets irritated or itchy. You have a rash after using CHG. You swallow any CHG. Call your local poison control center [phone] in the [place] Your eyes itch badly, or they become very red or swollen. Your hearing changes. You have trouble seeing. If you can't reach your provider, go to an urgent care or emergency room. Do not drive yourself. Get help right away if: You have swelling or tingling in your mouth or throat. You make high-pitched whistling sounds when you breathe, most often when you breathe out (wheeze). You have trouble breathing. These symptoms may be an emergency. Call 911 right away. Do not wait to see if the symptoms will go away. Do not drive yourself to the hospital. This information is not intended to replace advice given to you by your health care provider. Make sure you discuss any questions you have with your health care provider. Document Revised: 08/19/2022 Document Reviewed: 08/15/2021 Elsevier Patient Education  2024 [employer].

## 2023-04-21 ENCOUNTER — Other Ambulatory Visit: Payer: Self-pay

## 2023-04-21 ENCOUNTER — Encounter (HOSPITAL_COMMUNITY)
Admission: RE | Admit: 2023-04-21 | Discharge: 2023-04-21 | Disposition: A | Discharge status: Home / Self Care | Payer: No Typology Code available for payment source | Source: Ambulatory Visit | Attending: Urology | Admitting: Urology

## 2023-04-21 ENCOUNTER — Encounter (HOSPITAL_COMMUNITY): Payer: Self-pay

## 2023-04-21 DIAGNOSIS — Z01818 Encounter for other preprocedural examination: Secondary | ICD-10-CM | POA: Diagnosis not present

## 2023-04-21 DIAGNOSIS — R9431 Abnormal electrocardiogram [ECG] [EKG]: Secondary | ICD-10-CM | POA: Insufficient documentation

## 2023-04-21 DIAGNOSIS — Z6837 Body mass index (BMI) 37.0-37.9, adult: Secondary | ICD-10-CM | POA: Insufficient documentation

## 2023-04-21 DIAGNOSIS — Z01810 Encounter for preprocedural cardiovascular examination: Secondary | ICD-10-CM | POA: Diagnosis present

## 2023-04-21 DIAGNOSIS — R002 Palpitations: Secondary | ICD-10-CM | POA: Insufficient documentation

## 2023-04-21 DIAGNOSIS — Z01812 Encounter for preprocedural laboratory examination: Secondary | ICD-10-CM | POA: Diagnosis present

## 2023-04-21 HISTORY — DX: Personal history of urinary calculi: Z87.442

## 2023-04-21 LAB — POCT PREGNANCY, URINE: Preg Test, Ur: NEGATIVE

## 2023-04-23 ENCOUNTER — Emergency Department (HOSPITAL_COMMUNITY)
Admission: EM | Admit: 2023-04-23 | Discharge: 2023-04-24 | Discharge status: Against Medical Advice | Source: Home / Self Care

## 2023-04-23 ENCOUNTER — Encounter (HOSPITAL_COMMUNITY)
Admission: RE | Disposition: A | Discharge status: Home / Self Care | Payer: Self-pay | Source: Home / Self Care | Attending: Urology

## 2023-04-23 ENCOUNTER — Other Ambulatory Visit: Payer: Self-pay

## 2023-04-23 ENCOUNTER — Ambulatory Visit (HOSPITAL_COMMUNITY)

## 2023-04-23 ENCOUNTER — Encounter (HOSPITAL_COMMUNITY): Payer: Self-pay | Admitting: Urology

## 2023-04-23 ENCOUNTER — Ambulatory Visit (HOSPITAL_COMMUNITY)
Admission: RE | Admit: 2023-04-23 | Discharge: 2023-04-23 | Disposition: A | Discharge status: Home / Self Care | Payer: No Typology Code available for payment source | Attending: Urology | Admitting: Urology

## 2023-04-23 ENCOUNTER — Ambulatory Visit (HOSPITAL_COMMUNITY): Admitting: Anesthesiology

## 2023-04-23 ENCOUNTER — Ambulatory Visit (HOSPITAL_BASED_OUTPATIENT_CLINIC_OR_DEPARTMENT_OTHER): Admitting: Anesthesiology

## 2023-04-23 DIAGNOSIS — Z833 Family history of diabetes mellitus: Secondary | ICD-10-CM | POA: Diagnosis not present

## 2023-04-23 DIAGNOSIS — E039 Hypothyroidism, unspecified: Secondary | ICD-10-CM | POA: Diagnosis not present

## 2023-04-23 DIAGNOSIS — F419 Anxiety disorder, unspecified: Secondary | ICD-10-CM | POA: Diagnosis not present

## 2023-04-23 DIAGNOSIS — T819XXA Unspecified complication of procedure, initial encounter: Secondary | ICD-10-CM | POA: Insufficient documentation

## 2023-04-23 DIAGNOSIS — Z5321 Procedure and treatment not carried out due to patient leaving prior to being seen by health care provider: Secondary | ICD-10-CM | POA: Insufficient documentation

## 2023-04-23 DIAGNOSIS — Y838 Other surgical procedures as the cause of abnormal reaction of the patient, or of later complication, without mention of misadventure at the time of the procedure: Secondary | ICD-10-CM | POA: Insufficient documentation

## 2023-04-23 DIAGNOSIS — I10 Essential (primary) hypertension: Secondary | ICD-10-CM | POA: Diagnosis not present

## 2023-04-23 DIAGNOSIS — N201 Calculus of ureter: Secondary | ICD-10-CM

## 2023-04-23 DIAGNOSIS — F32A Depression, unspecified: Secondary | ICD-10-CM | POA: Insufficient documentation

## 2023-04-23 DIAGNOSIS — R519 Headache, unspecified: Secondary | ICD-10-CM | POA: Diagnosis not present

## 2023-04-23 DIAGNOSIS — K449 Diaphragmatic hernia without obstruction or gangrene: Secondary | ICD-10-CM | POA: Diagnosis not present

## 2023-04-23 DIAGNOSIS — Z6837 Body mass index (BMI) 37.0-37.9, adult: Secondary | ICD-10-CM | POA: Diagnosis not present

## 2023-04-23 DIAGNOSIS — F418 Other specified anxiety disorders: Secondary | ICD-10-CM

## 2023-04-23 DIAGNOSIS — N200 Calculus of kidney: Secondary | ICD-10-CM

## 2023-04-23 DIAGNOSIS — R350 Frequency of micturition: Secondary | ICD-10-CM | POA: Insufficient documentation

## 2023-04-23 DIAGNOSIS — K5730 Diverticulosis of large intestine without perforation or abscess without bleeding: Secondary | ICD-10-CM | POA: Diagnosis not present

## 2023-04-23 HISTORY — PX: CYSTOSCOPY WITH RETROGRADE PYELOGRAM, URETEROSCOPY AND STENT PLACEMENT: SHX5789

## 2023-04-23 SURGERY — CYSTOURETEROSCOPY, WITH RETROGRADE PYELOGRAM AND STENT INSERTION
Anesthesia: General | Site: Ureter | Laterality: Bilateral

## 2023-04-23 MED ORDER — SODIUM CHLORIDE 0.9 % IR SOLN
Status: DC | PRN
  Administered 2023-04-23: 3000 mL

## 2023-04-23 MED ORDER — SCOPOLAMINE 1 MG/3DAYS TD PT72
1.0000 | MEDICATED_PATCH | TRANSDERMAL | Status: DC
  Administered 2023-04-23: 1.5 mg via TRANSDERMAL

## 2023-04-23 MED ORDER — DIATRIZOATE MEGLUMINE 30 % UR SOLN
URETHRAL | Status: DC | PRN
  Administered 2023-04-23: 14 mL via URETHRAL

## 2023-04-23 MED ORDER — FENTANYL CITRATE PF 50 MCG/ML IJ SOSY
PREFILLED_SYRINGE | INTRAMUSCULAR | Status: AC
  Filled 2023-04-23: qty 1

## 2023-04-23 MED ORDER — DEXMEDETOMIDINE HCL IN NACL 80 MCG/20ML IV SOLN
INTRAVENOUS | Status: DC | PRN
  Administered 2023-04-23: 12 ug via INTRAVENOUS

## 2023-04-23 MED ORDER — ORAL CARE MOUTH RINSE: 15.0000 mL | Freq: Once | OROMUCOSAL | Status: AC

## 2023-04-23 MED ORDER — PROPOFOL 10 MG/ML IV BOLUS
INTRAVENOUS | Status: DC | PRN
  Administered 2023-04-23: 50 mg via INTRAVENOUS
  Administered 2023-04-23: 200 mg via INTRAVENOUS
  Administered 2023-04-23: 50 mg via INTRAVENOUS

## 2023-04-23 MED ORDER — FENTANYL CITRATE (PF) 100 MCG/2ML IJ SOLN
INTRAMUSCULAR | Status: AC
Start: 2023-04-23 — End: ?
  Filled 2023-04-23: qty 2

## 2023-04-23 MED ORDER — OXYCODONE HCL 5 MG/5ML PO SOLN: 5.0000 mg | Freq: Once | ORAL | Status: DC | PRN

## 2023-04-23 MED ORDER — OXYCODONE-ACETAMINOPHEN 5-325 MG PO TABS: 1.0000 | ORAL_TABLET | ORAL | 0 refills | Status: DC | PRN

## 2023-04-23 MED ORDER — ONDANSETRON HCL 4 MG/2ML IJ SOLN
4.0000 mg | Freq: Once | INTRAMUSCULAR | Status: AC | PRN
  Administered 2023-04-23: 4 mg via INTRAVENOUS

## 2023-04-23 MED ORDER — SCOPOLAMINE 1 MG/3DAYS TD PT72
MEDICATED_PATCH | TRANSDERMAL | Status: AC
  Filled 2023-04-23: qty 1

## 2023-04-23 MED ORDER — ONDANSETRON HCL 4 MG/2ML IJ SOLN
INTRAMUSCULAR | Status: AC
  Filled 2023-04-23: qty 2

## 2023-04-23 MED ORDER — CHLORHEXIDINE GLUCONATE 0.12 % MT SOLN
15.0000 mL | Freq: Once | OROMUCOSAL | Status: AC
  Administered 2023-04-23: 15 mL via OROMUCOSAL

## 2023-04-23 MED ORDER — CEFAZOLIN SODIUM-DEXTROSE 2-4 GM/100ML-% IV SOLN
2.0000 g | INTRAVENOUS | Status: AC
  Administered 2023-04-23: 2 g via INTRAVENOUS

## 2023-04-23 MED ORDER — CEFAZOLIN SODIUM-DEXTROSE 2-4 GM/100ML-% IV SOLN
INTRAVENOUS | Status: AC
  Filled 2023-04-23: qty 100

## 2023-04-23 MED ORDER — OXYCODONE HCL 5 MG PO TABS: 5.0000 mg | ORAL_TABLET | Freq: Once | ORAL | Status: DC | PRN

## 2023-04-23 MED ORDER — FENTANYL CITRATE (PF) 100 MCG/2ML IJ SOLN
INTRAMUSCULAR | Status: DC | PRN
  Administered 2023-04-23: 100 ug via INTRAVENOUS
  Administered 2023-04-23 (×2): 50 ug via INTRAVENOUS

## 2023-04-23 MED ORDER — MIDAZOLAM HCL 2 MG/2ML IJ SOLN
INTRAMUSCULAR | Status: AC
  Filled 2023-04-23: qty 2

## 2023-04-23 MED ORDER — DIATRIZOATE MEGLUMINE 30 % UR SOLN
URETHRAL | Status: AC
  Filled 2023-04-23: qty 100

## 2023-04-23 MED ORDER — ONDANSETRON HCL 4 MG/2ML IJ SOLN
INTRAMUSCULAR | Status: DC | PRN
  Administered 2023-04-23: 4 mg via INTRAVENOUS

## 2023-04-23 MED ORDER — PROPOFOL 10 MG/ML IV BOLUS
INTRAVENOUS | Status: AC
  Filled 2023-04-23: qty 20

## 2023-04-23 MED ORDER — MIDAZOLAM HCL 2 MG/2ML IJ SOLN
INTRAMUSCULAR | Status: DC | PRN
  Administered 2023-04-23: 2 mg via INTRAVENOUS

## 2023-04-23 MED ORDER — FENTANYL CITRATE PF 50 MCG/ML IJ SOSY
25.0000 ug | PREFILLED_SYRINGE | INTRAMUSCULAR | Status: DC | PRN
  Administered 2023-04-23: 25 ug via INTRAVENOUS

## 2023-04-23 MED ORDER — LACTATED RINGERS IV SOLN: INTRAVENOUS | Status: DC

## 2023-04-23 SURGICAL SUPPLY — 25 items
BAG DRAIN URO TABLE W/ADPT NS (BAG) ×3 IMPLANT
BAG HAMPER (MISCELLANEOUS) ×3 IMPLANT
CATH INTERMIT  6FR 70CM (CATHETERS) ×3 IMPLANT
CLOTH BEACON ORANGE TIMEOUT ST (SAFETY) ×3 IMPLANT
DECANTER SPIKE VIAL GLASS SM (MISCELLANEOUS) ×3 IMPLANT
EXTRACTOR STONE NITINOL NGAGE (UROLOGICAL SUPPLIES) ×1 IMPLANT
GLOVE BIO SURGEON STRL SZ8 (GLOVE) ×3 IMPLANT
GLOVE BIOGEL PI IND STRL 7.0 (GLOVE) ×6 IMPLANT
GOWN STRL REUS W/TWL LRG LVL3 (GOWN DISPOSABLE) ×3 IMPLANT
GOWN STRL REUS W/TWL XL LVL3 (GOWN DISPOSABLE) ×3 IMPLANT
GUIDEWIRE STR DUAL SENSOR (WIRE) ×3 IMPLANT
GUIDEWIRE STR ZIPWIRE 035X150 (MISCELLANEOUS) ×3 IMPLANT
KIT TURNOVER CYSTO (KITS) ×3 IMPLANT
MANIFOLD NEPTUNE II (INSTRUMENTS) ×3 IMPLANT
PACK CYSTO (CUSTOM PROCEDURE TRAY) ×3 IMPLANT
PAD ARMBOARD 7.5X6 YLW CONV (MISCELLANEOUS) ×3 IMPLANT
POSITIONER HEAD 8X9X4 ADT (SOFTGOODS) ×3 IMPLANT
SHEATH NAVIGATOR HD 11/13X36 (SHEATH) ×1 IMPLANT
SOL .9 NS 3000ML IRR UROMATIC (IV SOLUTION) ×6 IMPLANT
STENT URET 6FRX26 CONTOUR (STENTS) ×1 IMPLANT
SYR 10ML LL (SYRINGE) ×3 IMPLANT
SYR CONTROL 10ML LL (SYRINGE) ×3 IMPLANT
TAPE HY 2X5 PINK NS (GAUZE/BANDAGES/DRESSINGS) ×1 IMPLANT
TOWEL OR 17X26 4PK STRL BLUE (TOWEL DISPOSABLE) ×3 IMPLANT
WATER STERILE IRR 500ML POUR (IV SOLUTION) ×3 IMPLANT

## 2023-04-23 NOTE — Op Note (Signed)
.  Preoperative diagnosis: bilateral renal calculi  Postoperative diagnosis: Same  Procedure: 1 cystoscopy 2. Bilateral retrograde pyelography 3.  Intraoperative fluoroscopy, under one hour, with interpretation 4.  Bilateral ureteroscopic stone manipulation with basket extraction 5.  Left 6x26 JJ ureteral stent placement  Attending: [name]  Anesthesia: General  Estimated blood loss: None  Drains: left 6 x 26 JJ ureteral stent with tether  Specimens: stone for analysis  Antibiotics: ancef  Findings: bilateral lower pole renal calculi. No hydronephrosis. No masses/lesions in the bladder. Ureteral orifices in normal anatomic location.  Indications: Patient is a 48 year old female with a history of bilateral renal calculi. After discussing treatment options, they decided proceed with bilateral ureteroscopic stone manipulation.  Procedure in detail: The patient was brought to the operating room and a brief timeout was done to ensure correct patient, correct procedure, correct site.  General anesthesia was administered patient was placed in dorsal lithotomy position.  Her genitalia was then prepped and draped in usual sterile fashion.  A rigid 22 French cystoscope was passed in the urethra and the bladder.  Bladder was inspected free masses or lesions.  the ureteral orifices were in the normal orthotopic locations. a 6 french ureteral catheter was then instilled into the left ureteral orifice.  a gentle retrograde was obtained and findings noted above. We then advanced a zipwire through the catheter and up to the renal pelvis.  we then removed the cystoscope and cannulated the left ureteral orifice with a semirigid ureteroscope.  We located no stone in the ureter. We then placed a sensor wire up to the renal pelvis. We removed the scope and advanced a 12/14 x 38cm access sheath up to the renal pelvis. We then used the flexible ureteroscope to perform nephroscopy. We located a calculus in  the lower pole which was removed with an NGage basket. Once the stone were removed we then removed the access sheath under direct vision and noted to injury to the ureter.  We then placed a 6 x 26 double-j ureteral stent over the original zip wire. We then removed the wire and good coil was noted in the the renal pelvis under fluoroscopy and the bladder under direct vision.  We then turned out attention to the right side. a 6 french ureteral catheter was then instilled into the right ureteral orifice.  a gentle retrograde was obtained and findings noted above. We then advanced a zipwire through the catheter and up to the renal pelvis.  we then removed the cystoscope and cannulated the left ureteral orifice with a semirigid ureteroscope.  We located no stone in the ureter. We then placed a sensor wire up to the renal pelvis. We removed the scope and advanced a flexible ureteroscope up to the renal pelvis to perform nephroscopy. We located a calculus in the lower pole which was removed with an NGage basket. We removed the scope and elected not to place a stent since this was an uncomplicated ureteroscopy. the bladder was then drained and this concluded the procedure which was well tolerated by patient.  Complications: None  Condition: Stable, extubated, transferred to PACU  Plan: Patient is to be discharged home as to follow-up in 2 weeks. She is to remove her stent by pulling the tether in 72 hours

## 2023-04-23 NOTE — Anesthesia Procedure Notes (Signed)
 Procedure Name: LMA Insertion Date/Time: 04/23/2023 3:13 PM  Performed by: Shanon Payor, CRNAPre-anesthesia Checklist: Patient identified, Emergency Drugs available, Suction available, Patient being monitored and Timeout performed Patient Re-evaluated:Patient Re-evaluated prior to induction Oxygen Delivery Method: Circle system utilized Preoxygenation: Pre-oxygenation with 100% oxygen Induction Type: IV induction LMA: LMA inserted LMA Size: 4.0 Number of attempts: 1 Placement Confirmation: positive ETCO2, CO2 detector and breath sounds checked- equal and bilateral Tube secured with: Tape Dental Injury: Teeth and Oropharynx as per pre-operative assessment

## 2023-04-23 NOTE — Interval H&P Note (Signed)
 History and Physical Interval Note:  04/23/2023 2:41 PM  Barbara [name]  has presented today for surgery, with the diagnosis of bilateral renal calculi.  The various methods of treatment have been discussed with the patient and family. After consideration of risks, benefits and other options for treatment, the patient has consented to  Procedure(s): CYSTOURETEROSCOPY, WITH RETROGRADE PYELOGRAM AND STENT INSERTION (Bilateral) HOLMIUM LASER APPLICATION (Bilateral) as a surgical intervention.  The patient's history has been reviewed, patient examined, no change in status, stable for surgery.  I have reviewed the patient's chart and labs.  Questions were answered to the patient's satisfaction.     [name]

## 2023-04-23 NOTE — ED Triage Notes (Signed)
 Pt from home complains of urinary frequency following bilateral kidney stone removal. Pt states that she can't pee whenever she tries to go, but it randomly comes out when she walks.

## 2023-04-23 NOTE — Anesthesia Preprocedure Evaluation (Signed)
 Anesthesia Evaluation  Patient identified by MRN, date of birth, ID band Patient awake    Reviewed: Allergy & Precautions, H&P , NPO status , Patient's Chart, lab work & pertinent test results, reviewed documented beta blocker date and time   History of Anesthesia Complications (+) PONV and history of anesthetic complications  Airway Mallampati: II  TM Distance: >3 FB Neck ROM: full    Dental no notable dental hx.    Pulmonary neg pulmonary ROS   Pulmonary exam normal breath sounds clear to auscultation       Cardiovascular Exercise Tolerance: Good hypertension, negative cardio ROS  Rhythm:regular Rate:Normal     Neuro/Psych  Headaches PSYCHIATRIC DISORDERS Anxiety Depression     Neuromuscular disease negative neurological ROS  negative psych ROS   GI/Hepatic negative GI ROS, Neg liver ROS,,,  Endo/Other  negative endocrine ROSHypothyroidism    Renal/GU negative Renal ROS  negative genitourinary   Musculoskeletal   Abdominal   Peds  Hematology negative hematology ROS (+)   Anesthesia Other Findings   Reproductive/Obstetrics negative OB ROS                             Anesthesia Physical Anesthesia Plan  ASA: 3  Anesthesia Plan: General and General LMA   Post-op Pain Management:    Induction:   PONV Risk Score and Plan: Ondansetron  Airway Management Planned:   Additional Equipment:   Intra-op Plan:   Post-operative Plan:   Informed Consent: I have reviewed the patients History and Physical, chart, labs and discussed the procedure including the risks, benefits and alternatives for the proposed anesthesia with the patient or authorized representative who has indicated his/her understanding and acceptance.     Dental Advisory Given  Plan Discussed with: CRNA  Anesthesia Plan Comments:        Anesthesia Quick Evaluation

## 2023-04-23 NOTE — Progress Notes (Signed)
 Patient on bedpan

## 2023-04-23 NOTE — Transfer of Care (Signed)
 Immediate Anesthesia Transfer of Care Note  Patient: Barbara [name]  Procedure(s) Performed: Tammi Klippel, WITH RETROGRADE PYELOGRAM AND STENT INSERTION (Bilateral: Ureter) HOLMIUM LASER APPLICATION (Bilateral)  Patient Location: PACU  Anesthesia Type:General  Level of Consciousness: awake, alert , oriented, and patient cooperative  Airway & Oxygen Therapy: Patient Spontanous Breathing  Post-op Assessment: Report given to RN, Post -op Vital signs reviewed and stable, and Patient moving all extremities X 4  Post vital signs: Reviewed and stable  Last Vitals:  Vitals Value Taken Time  BP 86/56 04/23/23  1606  Temp 36.7 C 04/23/23 1606  Pulse 81 04/23/23 1606  Resp 12 04/23/23 1606  SpO2 93 % 04/23/23 1606    Last Pain:  Vitals:   04/23/23 1238  TempSrc: Oral         Complications: No notable events documented.

## 2023-04-24 ENCOUNTER — Telehealth: Payer: Self-pay | Admitting: Urology

## 2023-04-24 ENCOUNTER — Emergency Department (HOSPITAL_COMMUNITY)

## 2023-04-24 ENCOUNTER — Encounter (HOSPITAL_COMMUNITY): Payer: Self-pay | Admitting: Urology

## 2023-04-24 NOTE — Telephone Encounter (Signed)
 I spoke with patient to let her know her symptoms can be caused by having her stent in place.  Verbal from Dr. Ronne Binning patient may pull stent if her symptoms continue.  I informed patient, she states her husband pulled stent last night and her symptoms have improved.  Patient will follow up as scheduled.

## 2023-04-24 NOTE — Telephone Encounter (Signed)
 Patient left another message unable to urinate

## 2023-04-24 NOTE — Telephone Encounter (Signed)
 Patient left message she is in ER, had surgery and is unable to control her urine. She is having right kidney pain

## 2023-04-27 NOTE — Anesthesia Postprocedure Evaluation (Signed)
 Anesthesia Post Note  Patient: CAMIELLE SIZER  Procedure(s) Performed: Tammi Klippel, WITH RETROGRADE PYELOGRAM AND STENT INSERTION (Bilateral: Ureter)  Patient location during evaluation: Phase II Anesthesia Type: General Level of consciousness: awake Pain management: pain level controlled Vital Signs Assessment: post-procedure vital signs reviewed and stable Respiratory status: spontaneous breathing and respiratory function stable Cardiovascular status: blood pressure returned to baseline and stable Postop Assessment: no headache and no apparent nausea or vomiting Anesthetic complications: no Comments: Late entry   No notable events documented.   Last Vitals:  Vitals:   04/23/23 1628 04/23/23 1630  BP: 106/67 106/65  Pulse: (!) 58 (!) 57  Resp: 16 (!) 22  Temp: 36.8 C   SpO2: 100% 99%    Last Pain:  Vitals:   04/24/23 1446  TempSrc:   PainSc: 3                  Windell Norfolk

## 2023-04-28 ENCOUNTER — Encounter: Payer: Self-pay | Admitting: Urology

## 2023-04-28 ENCOUNTER — Ambulatory Visit: Admitting: Urology

## 2023-04-28 VITALS — BP 132/84 | HR 97 | Temp 98.8°F

## 2023-04-28 DIAGNOSIS — M6289 Other specified disorders of muscle: Secondary | ICD-10-CM | POA: Diagnosis not present

## 2023-04-28 DIAGNOSIS — R102 Pelvic and perineal pain: Secondary | ICD-10-CM

## 2023-04-28 DIAGNOSIS — R110 Nausea: Secondary | ICD-10-CM

## 2023-04-28 DIAGNOSIS — N398 Other specified disorders of urinary system: Secondary | ICD-10-CM | POA: Diagnosis not present

## 2023-04-28 DIAGNOSIS — R399 Unspecified symptoms and signs involving the genitourinary system: Secondary | ICD-10-CM

## 2023-04-28 DIAGNOSIS — R1032 Left lower quadrant pain: Secondary | ICD-10-CM

## 2023-04-28 DIAGNOSIS — R109 Unspecified abdominal pain: Secondary | ICD-10-CM

## 2023-04-28 DIAGNOSIS — G43909 Migraine, unspecified, not intractable, without status migrainosus: Secondary | ICD-10-CM

## 2023-04-28 DIAGNOSIS — N200 Calculus of kidney: Secondary | ICD-10-CM

## 2023-04-28 DIAGNOSIS — Z09 Encounter for follow-up examination after completed treatment for conditions other than malignant neoplasm: Secondary | ICD-10-CM

## 2023-04-28 DIAGNOSIS — R8290 Unspecified abnormal findings in urine: Secondary | ICD-10-CM

## 2023-04-28 LAB — URINALYSIS, ROUTINE W REFLEX MICROSCOPIC
Bilirubin, UA: NEGATIVE
Glucose, UA: NEGATIVE
Nitrite, UA: NEGATIVE
Specific Gravity, UA: 1.01 (ref 1.005–1.030)
Urobilinogen, Ur: 0.2 mg/dL (ref 0.2–1.0)
pH, UA: 6 (ref 5.0–7.5)

## 2023-04-28 LAB — MICROSCOPIC EXAMINATION: RBC, Urine: >30 /HPF — AB (ref 0–2)

## 2023-04-28 LAB — BLADDER SCAN AMB NON-IMAGING: Scan Result: 1

## 2023-04-28 MED ORDER — ONDANSETRON HCL 4 MG PO TABS: 4.0000 mg | ORAL_TABLET | Freq: Three times a day (TID) | ORAL | 1 refills | Status: DC | PRN

## 2023-04-28 MED ORDER — OXYCODONE HCL 5 MG PO TABS: 5.0000 mg | ORAL_TABLET | ORAL | 0 refills | Status: DC | PRN

## 2023-04-28 MED ORDER — NITROFURANTOIN MONOHYD MACRO 100 MG PO CAPS: 100.0000 mg | ORAL_CAPSULE | Freq: Two times a day (BID) | ORAL | 0 refills | Status: AC

## 2023-04-28 MED ORDER — CYCLOBENZAPRINE HCL 10 MG PO TABS: 10.0000 mg | ORAL_TABLET | Freq: Three times a day (TID) | ORAL | 5 refills | Status: DC | PRN

## 2023-04-28 NOTE — Progress Notes (Signed)
 Name: Barbara [name] DOB: 12/29/1975 MRN: [id number]  Diagnoses: 1) Post-operative state  HPI: Barbara [name] presents post-operatively.  She is accompanied by her husband Casimiro Needle. - GU history includes:  1. Kidney stone(s).  She underwent the following procedure on 04/23/2023 by Dr. Ronne Binning:  Preoperative diagnosis: bilateral renal calculi   Postoperative diagnosis: Same   Procedure:  1. cystoscopy 2. Bilateral retrograde pyelography 3.  Intraoperative fluoroscopy, under one hour, with interpretation 4.  Bilateral ureteroscopic stone manipulation with basket extraction 5.  Left 6x26 JJ ureteral stent placement  Postop course: > 04/24/2023:  - Seen in the ER at [facility] for postop urinary incontinence and abdominal pain.  - Afebrile. - GFR 68; creatinine 1.01. CBC with leukocytosis (WBC 14.3). Per provider note CT scan showed "displacement of the ureteral stent with its tip in the distal ureter/will remove the stent which is probably the cause of her urinary incontinence". Patient reports that ER provider removed her tethered left ureteral stent.   Today: She reports constant severe flank pain radiating to LLQ along with pelvic pain. Since surgery she reports that she has not been feeling any sensation of needing to void; has been doing timed voiding 4x/day.  States the uncontrollable urine leakage has resolved but she is having bladder spasms, some terminal dysuria, gross hematuria, some urinary hesitancy and sensations of incomplete emptying. Denies straining to void but it doing positional voiding to empty as best as she can. Reports passing some small blood clots in the past 12 hours. Also reports that she has a migraine today, which she correlates to muscle tension secondary to her postop stone pain. Reports nausea; denies fevers or vomiting.  Medications: Current Outpatient Medications  Medication Sig Dispense Refill   Calcium-Phosphorus-Vitamin D (CALCIUM/D3  ADULT GUMMIES PO) Take 1 tablet by mouth 3 (three) times daily.     cyclobenzaprine (FLEXERIL) 10 MG tablet Take 1 tablet (10 mg total) by mouth 3 (three) times daily as needed for muscle spasms. 90 tablet 5   eletriptan (RELPAX) 40 MG tablet Take 1 tablet (40 mg total) by mouth as needed for migraine or headache. May repeat in 2 hours if headache persists or recurs. 10 tablet 11   escitalopram (LEXAPRO) 10 MG tablet Take 1 tablet (10 mg total) by mouth daily. 90 tablet 3   hydrOXYzine (VISTARIL) 25 MG capsule TAKE 1-2 CAPSULES (25-50 MG TOTAL) BY MOUTH EVERY 8 (EIGHT) HOURS AS NEEDED FOR ANXIETY. (Patient taking differently: Take 25 mg by mouth at bedtime.) 540 capsule 2   levocetirizine (XYZAL) 5 MG tablet Take 5 mg by mouth every evening.     Multiple Vitamins-Minerals (BARIATRIC FUSION) CHEW Chew 1 tablet by mouth in the morning. NO IRON     nitrofurantoin, macrocrystal-monohydrate, (MACROBID) 100 MG capsule Take 1 capsule (100 mg total) by mouth 2 (two) times daily for 7 days. 14 capsule 0   oxyCODONE (OXY IR/ROXICODONE) 5 MG immediate release tablet Take 1 tablet (5 mg total) by mouth every 4 (four) hours as needed for severe pain (pain score 7-10). 30 tablet 0   sodium bicarbonate 650 MG tablet Take 1 tablet (650 mg total) by mouth 2 (two) times daily. 60 tablet 11   ondansetron (ZOFRAN) 4 MG tablet Take 1 tablet (4 mg total) by mouth every 8 (eight) hours as needed for nausea or vomiting. 20 tablet 1   No current facility-administered medications for this visit.    Allergies: Allergies  Allergen Reactions   Prednisone Other (  See Comments)    BRADYCARDIA-heart rate below 40 for longer that 10 minutes   Sulfamethoxazole-Trimethoprim Nausea And Vomiting and Rash   Ibuprofen     Had gastric sleeve   Penicillins Hives    Tolerates cephalosporins. Has patient had a PCN reaction causing immediate rash, facial/tongue/throat swelling, SOB or lightheadedness with hypotension: No Has patient  had a PCN reaction causing severe rash involving mucus membranes or skin necrosis: No Has patient had a PCN reaction that required hospitalization: No Has patient had a PCN reaction occurring within the last 10 years: No If all of the above answers are "NO", then may proceed with Cephalosporin use.    Tape Other (See Comments)    PAPER TAPE OK; RED AND RAW SKIN & SKIN TEARS   Latex Rash    Some gloves cause red and raw skin. Skin peels off.     Past Medical History:  Diagnosis Date   Anxiety    Depression    History of kidney stones    History of palpitations 2011   work-up with cardiologist with event monitor, showed no arrhythmias   History of panic attacks    Migraine headache    Morbid obesity (HCC)    PONV (postoperative nausea and vomiting)    Past Surgical History:  Procedure Laterality Date   COLONOSCOPY WITH PROPOFOL N/A 11/27/2020   Procedure: COLONOSCOPY WITH PROPOFOL;  Surgeon: Lanelle Bal, DO;  Location: [facility];  Service: Endoscopy;  Laterality: N/A;  9:30 / ASA II   CYSTOSCOPY WITH RETROGRADE PYELOGRAM, URETEROSCOPY AND STENT PLACEMENT Bilateral 04/23/2023   Procedure: CYSTOURETEROSCOPY, WITH RETROGRADE PYELOGRAM AND STENT INSERTION;  Surgeon: Malen Gauze, MD;  Location: [facility];  Service: Urology;  Laterality: Bilateral;   D & C HYSTEROSCOPY W/ ENDOMETRIAL ABLATION  01-29-2005   dr Despina Hidden  @[facility]    ELBOW SURGERY Left    ESOPHAGOGASTRODUODENOSCOPY N/A 05/18/2014   SLF: 1. Mild esophaigitis. 2. Mild non-erosive gastritis.    LAPAROSCOPIC GASTRIC SLEEVE RESECTION N/A 12/22/2017   Procedure: LAPAROSCOPIC GASTRIC SLEEVE RESECTION, UPPER ENDO, ERAS PATHWAY;  Surgeon: Berna Bue, MD;  Location: [facility];  Service: General;  Laterality: N/A;   ORIF RADIUS & ULNA FRACTURES Left 11-01-2006   dr Melvyn Novas @[facility]    ULNAR NERVE TRANSPOSITION Left 2011   AND REMOVAL HARDWARE OF WRIST   Family History  Problem Relation Age of Onset   Arthritis Mother     Depression Father    Arthritis Father    Anxiety disorder Father    Hypertension Father    Hyperlipidemia Father    Diabetes Father    Crohn's disease Daughter    Heart disease Paternal Grandmother    Arthritis Paternal Grandmother    Diabetes Paternal Grandmother    Hyperlipidemia Paternal Grandmother    Hypertension Paternal Grandmother    Heart attack Paternal Grandmother    Hypertension Paternal Grandfather    Arthritis Paternal Grandfather    Colon cancer Neg Hx    Breast cancer Neg Hx    Social History   Socioeconomic History   Marital status: Married    Spouse name: Casimiro Needle   Number of children: 2   Years of education: 14   Highest education level: Some college, no degree  Occupational History   Not on file  Tobacco Use   Smoking status: Never   Smokeless tobacco: Never  Vaping Use   Vaping status: Never Used  Substance and Sexual Activity   Alcohol use: No  Drug use: No   Sexual activity: Yes    Birth control/protection: Other-see comments    Comment: vasectomy; ablation  Other Topics Concern   Not on file  Social History Narrative   Not on file   Social Drivers of Health   Financial Resource Strain: Low Risk  (12/03/2022)   Overall Financial Resource Strain (CARDIA)    Difficulty of Paying Living Expenses: Not very hard  Food Insecurity: No Food Insecurity (12/03/2022)   Hunger Vital Sign    Worried About Running Out of Food in the Last Year: Never true    Ran Out of Food in the Last Year: Never true  Transportation Needs: No Transportation Needs (12/03/2022)   PRAPARE - Administrator, Civil Service (Medical): No    Lack of Transportation (Non-Medical): No  Physical Activity: Insufficiently Active (12/03/2022)   Exercise Vital Sign    Days of Exercise per Week: 1 day    Minutes of Exercise per Session: 10 min  Stress: Not on file (12/26/2022)  Recent Concern: Stress - Stress Concern Present (12/03/2022)   [employer] of  Occupational Health - Occupational Stress Questionnaire    Feeling of Stress : To some extent  Social Connections: Moderately Integrated (12/03/2022)   Social Connection and Isolation Panel [NHANES]    Frequency of Communication with Friends and Family: More than three times a week    Frequency of Social Gatherings with Friends and Family: Twice a week    Attends Religious Services: More than 4 times per year    Active Member of [employer] or Organizations: No    Attends Engineer, structural: Not on file    Marital Status: Married  Catering manager Violence: Not At Risk (04/24/2023)   Received from [facility]   HITS    Over the last 12 months how often did your partner physically hurt you?: Never    Over the last 12 months how often did your partner insult you or talk down to you?: Never    Over the last 12 months how often did your partner threaten you with physical harm?: Never    Over the last 12 months how often did your partner scream or curse at you?: Never    SUBJECTIVE  Review of Systems Constitutional: Patient denies any unintentional weight loss or change in strength lntegumentary: Patient denies any rashes or pruritus Cardiovascular: Patient denies chest pain or syncope Respiratory: Patient denies shortness of breath Gastrointestinal: As per HPI Musculoskeletal: Patient denies muscle cramps or weakness Neurologic: Patient denies convulsions or seizures Allergic/Immunologic: Patient denies recent allergic reaction(s) Hematologic/Lymphatic: Patient denies bleeding tendencies Endocrine: Patient denies heat/cold intolerance  GU: As per HPI.  OBJECTIVE Vitals:   04/28/23 0941  BP: 132/84  Pulse: 97  Temp: 98.8 F (37.1 C)   There is no height or weight on file to calculate BMI.  Physical Examination Constitutional: Non-toxic appearing Cardiovascular: No visible lower extremity edema.  Respiratory: The patient does not have audible wheezing/stridor;  respirations do not appear labored  Gastrointestinal: Abdomen non-distended Musculoskeletal: Normal ROM of UEs  Skin: No obvious rashes/open sores  Neurologic: CN 2-12 grossly intact Psychiatric: Answered questions appropriately with mildly agitated affect; tearful at times  Hematologic/Lymphatic/Immunologic: No obvious bruises or sites of spontaneous bleeding  Urine microscopy: 6-10 WBC/hpf, >30 RBC/hpf, few bacteria PVR: 0 ml  ASSESSMENT Kidney stones - Plan: Urinalysis, Routine w reflex microscopic, BLADDER SCAN AMB NON-IMAGING, Urine Culture, oxyCODONE (OXY IR/ROXICODONE) 5 MG immediate release tablet, nitrofurantoin,  macrocrystal-monohydrate, (MACROBID) 100 MG capsule, ondansetron (ZOFRAN) 4 MG tablet  Nephrolithiasis - Plan: Urinalysis, Routine w reflex microscopic, BLADDER SCAN AMB NON-IMAGING, Urine Culture, oxyCODONE (OXY IR/ROXICODONE) 5 MG immediate release tablet, nitrofurantoin, macrocrystal-monohydrate, (MACROBID) 100 MG capsule  Postop check - Plan: Urine Culture, oxyCODONE (OXY IR/ROXICODONE) 5 MG immediate release tablet, nitrofurantoin, macrocrystal-monohydrate, (MACROBID) 100 MG capsule, ondansetron (ZOFRAN) 4 MG tablet  Voiding dysfunction - Plan: Urine Culture, oxyCODONE (OXY IR/ROXICODONE) 5 MG immediate release tablet, cyclobenzaprine (FLEXERIL) 10 MG tablet, nitrofurantoin, macrocrystal-monohydrate, (MACROBID) 100 MG capsule  Pelvic pain in female - Plan: Urine Culture, oxyCODONE (OXY IR/ROXICODONE) 5 MG immediate release tablet, cyclobenzaprine (FLEXERIL) 10 MG tablet, nitrofurantoin, macrocrystal-monohydrate, (MACROBID) 100 MG capsule  Pelvic floor dysfunction - Plan: Urine Culture, oxyCODONE (OXY IR/ROXICODONE) 5 MG immediate release tablet, cyclobenzaprine (FLEXERIL) 10 MG tablet, nitrofurantoin, macrocrystal-monohydrate, (MACROBID) 100 MG capsule  Lower urinary tract symptoms (LUTS) - Plan: Urine Culture, oxyCODONE (OXY IR/ROXICODONE) 5 MG immediate release  tablet, nitrofurantoin, macrocrystal-monohydrate, (MACROBID) 100 MG capsule  Abnormal urinalysis - Plan: Urine Culture, oxyCODONE (OXY IR/ROXICODONE) 5 MG immediate release tablet, nitrofurantoin, macrocrystal-monohydrate, (MACROBID) 100 MG capsule  Left flank pain - Plan: Urine Culture, oxyCODONE (OXY IR/ROXICODONE) 5 MG immediate release tablet, nitrofurantoin, macrocrystal-monohydrate, (MACROBID) 100 MG capsule  LLQ pain - Plan: Urine Culture, oxyCODONE (OXY IR/ROXICODONE) 5 MG immediate release tablet, nitrofurantoin, macrocrystal-monohydrate, (MACROBID) 100 MG capsule  Migraine without status migrainosus, not intractable, unspecified migraine type  Nausea - Plan: ondansetron (ZOFRAN) 4 MG tablet  We reviewed her recent history in detail including the operative procedures / findings and postop course thus far.   For pain management we agreed to switch to Oxycodone 5 mg every 6 hours PRN with OTC acetaminophen (up to 1000 mg every 6 hours) halfway between Oxycodone doses.   Refill sent for Zofran PRN for nausea.   Regarding her decreased urge to void and sensation of incomplete bladder emptying: Normal PVR today; no evidence of urinary retention. We discussed suspected pelvic floor dysfunction; we discussed the pathophysiology of that and how it can contribute to pelvic pain and voiding dysfunction. Advised management options including relaxation techniques, gentle stretching, warm soaks in the bath, and oral muscle relaxer. She cannot do NSAIDs due to history of gastric sleeve.  Abnormal UA. Will check urine culture and treat empirically with Macrobid while awaiting culture results and sensitivities.  She was concerned today because in the body of the CT [facility] it states "A urethral catheter into the bladder and extends asymmetric to the left and appears lodged within the UVJ causing obstruction" however she had no catheter in place after surgery or any time since. The  wording of this report made her somewhat concerned about whether or not she may have a retained foreign body inside the bladder. We discussed that this was likely a typographical error - suspect that the radiologist intended to write "ureteral" instead of "urethral" however unable to view outside CT images today for confirmation. Reassured by ED provider note which stated "scan shows displacement of the ureteral stent with its tip in the distal ureter". Offered KUB today for recheck; she declined and seemed reassured after our discussion.   Will plan for follow up as previously scheduled with Dr. Ronne Binning on 05/06/2023 or sooner if needed. Pt verbalized understanding and agreement. All questions were answered.  PLAN Advised the following: Urine culture. Macrobid (Nitrofurantoin) 100 mg 2x/day for 7 days. Oxycodone 5 mg every 6 hours PRN with OTC acetaminophen PRN (up to 1000  mg every 6 hours) halfway between Oxycodone doses.  Zofran PRN for nausea.  Maintain adequate fluid intake. Return in 8 days (on 05/06/2023) for as previously scheduled f/u with Dr. Ronne Binning.   Orders Placed This Encounter  Procedures   Urine Culture   Urinalysis, Routine w reflex microscopic   BLADDER SCAN AMB NON-IMAGING   Total time spent caring for the patient today was over 40 minutes. This includes time spent on the date of the visit reviewing the patient's chart before the visit, time spent during the visit, and time spent after the visit on documentation. Over 50% of that time was spent in face-to-face time with this patient for direct counseling. E&M based on time and complexity of medical decision making.  It has been explained that the patient is to follow regularly with their PCP in addition to all other providers involved in their care and to follow instructions provided by these respective offices. Patient advised to contact urology clinic if any urologic-pertaining questions, concerns, new symptoms or problems  arise in the interim period.  There are no Patient Instructions on file for this visit.  Electronically signed by:  Donnita Falls, MSN, FNP-C, CUNP 04/28/2023 2:07 PM

## 2023-05-01 ENCOUNTER — Telehealth: Payer: Self-pay

## 2023-05-01 LAB — URINE CULTURE

## 2023-05-01 NOTE — Telephone Encounter (Signed)
 Patient was made aware and voiced understanding.

## 2023-05-01 NOTE — Telephone Encounter (Signed)
-----   Message from Barbara [name] sent at 05/01/2023  2:27 PM EDT ----- Please notify patient: Negative urine culture, no antibiotic needed at this time. Can discontinue Macrobid. Thanks.

## 2023-05-02 LAB — CALCULI, WITH PHOTOGRAPH (CLINICAL LAB)
Calcium Oxalate Dihydrate: 30 %
Calcium Oxalate Monohydrate: 70 %
Weight Calculi: 18 mg

## 2023-05-06 ENCOUNTER — Ambulatory Visit: Payer: No Typology Code available for payment source | Admitting: Urology

## 2023-05-06 VITALS — BP 114/79 | HR 98

## 2023-05-06 DIAGNOSIS — N200 Calculus of kidney: Secondary | ICD-10-CM | POA: Diagnosis not present

## 2023-05-06 MED ORDER — SODIUM BICARBONATE 650 MG PO TABS: 650.0000 mg | ORAL_TABLET | Freq: Two times a day (BID) | ORAL | 11 refills | Status: AC

## 2023-05-06 NOTE — Progress Notes (Signed)
 05/06/2023 4:00 PM   DALAYA [name] February 26, 1975 [id number]  Referring provider: Gabriel Earing, FNP [address] [place],  [place] [address]  Followup nephrolithiasis   HPI: [name] is a 48yo here for followup for nephrolithiasis. She underwent bilateral ureteroscopy and had issues with a dislodged stent and had it removed POD#0. She then developed a UTI treated with macrobid.    PMH: Past Medical History:  Diagnosis Date   Anxiety    Depression    History of kidney stones    History of palpitations 2011   work-up with cardiologist with event monitor, showed no arrhythmias   History of panic attacks    Migraine headache    Morbid obesity (HCC)    PONV (postoperative nausea and vomiting)     Surgical History: Past Surgical History:  Procedure Laterality Date   COLONOSCOPY WITH PROPOFOL N/A 11/27/2020   Procedure: COLONOSCOPY WITH PROPOFOL;  Surgeon: Lanelle Bal, DO;  Location: [facility];  Service: Endoscopy;  Laterality: N/A;  9:30 / ASA II   CYSTOSCOPY WITH RETROGRADE PYELOGRAM, URETEROSCOPY AND STENT PLACEMENT Bilateral 04/23/2023   Procedure: CYSTOURETEROSCOPY, WITH RETROGRADE PYELOGRAM AND STENT INSERTION;  Surgeon: Malen Gauze, MD;  Location: [facility];  Service: Urology;  Laterality: Bilateral;   D & C HYSTEROSCOPY W/ ENDOMETRIAL ABLATION  01-29-2005   dr Despina Hidden  @[facility]    ELBOW SURGERY Left    ESOPHAGOGASTRODUODENOSCOPY N/A 05/18/2014   SLF: 1. Mild esophaigitis. 2. Mild non-erosive gastritis.    LAPAROSCOPIC GASTRIC SLEEVE RESECTION N/A 12/22/2017   Procedure: LAPAROSCOPIC GASTRIC SLEEVE RESECTION, UPPER ENDO, ERAS PATHWAY;  Surgeon: Berna Bue, MD;  Location: [facility];  Service: General;  Laterality: N/A;   ORIF RADIUS & ULNA FRACTURES Left 11-01-2006   dr Melvyn Novas @[facility]    ULNAR NERVE TRANSPOSITION Left 2011   AND REMOVAL HARDWARE OF WRIST    Home Medications:  Allergies as of 05/06/2023       Reactions   Prednisone Other (See Comments)    BRADYCARDIA-heart rate below 40 for longer that 10 minutes   Sulfamethoxazole-trimethoprim Nausea And Vomiting, Rash   Ibuprofen    Had gastric sleeve   Penicillins Hives   Tolerates cephalosporins. Has patient had a PCN reaction causing immediate rash, facial/tongue/throat swelling, SOB or lightheadedness with hypotension: No Has patient had a PCN reaction causing severe rash involving mucus membranes or skin necrosis: No Has patient had a PCN reaction that required hospitalization: No Has patient had a PCN reaction occurring within the last 10 years: No If all of the above answers are "NO", then may proceed with Cephalosporin use.   Tape Other (See Comments)   PAPER TAPE OK; RED AND RAW SKIN & SKIN TEARS   Latex Rash   Some gloves cause red and raw skin. Skin peels off.         Medication List        Accurate as of May 06, 2023  4:00 PM. If you have any questions, ask your nurse or doctor.          Bariatric Fusion Chew Chew 1 tablet by mouth in the morning. NO IRON   CALCIUM/D3 ADULT GUMMIES PO Take 1 tablet by mouth 3 (three) times daily.   cyclobenzaprine 10 MG tablet Commonly known as: FLEXERIL Take 1 tablet (10 mg total) by mouth 3 (three) times daily as needed for muscle spasms.   eletriptan 40 MG tablet Commonly known as: RELPAX Take 1 tablet (40 mg total)  by mouth as needed for migraine or headache. May repeat in 2 hours if headache persists or recurs.   escitalopram 10 MG tablet Commonly known as: LEXAPRO Take 1 tablet (10 mg total) by mouth daily.   hydrOXYzine 25 MG capsule Commonly known as: VISTARIL TAKE 1-2 CAPSULES (25-50 MG TOTAL) BY MOUTH EVERY 8 (EIGHT) HOURS AS NEEDED FOR ANXIETY. What changed:  how much to take when to take this   levocetirizine 5 MG tablet Commonly known as: XYZAL Take 5 mg by mouth every evening.   ondansetron 4 MG tablet Commonly known as: Zofran Take 1 tablet (4 mg total) by mouth every 8 (eight) hours as needed  for nausea or vomiting.   oxyCODONE 5 MG immediate release tablet Commonly known as: Oxy IR/ROXICODONE Take 1 tablet (5 mg total) by mouth every 4 (four) hours as needed for severe pain (pain score 7-10).   sodium bicarbonate 650 MG tablet Take 1 tablet (650 mg total) by mouth 2 (two) times daily.        Allergies:  Allergies  Allergen Reactions   Prednisone Other (See Comments)    BRADYCARDIA-heart rate below 40 for longer that 10 minutes   Sulfamethoxazole-Trimethoprim Nausea And Vomiting and Rash   Ibuprofen     Had gastric sleeve   Penicillins Hives    Tolerates cephalosporins. Has patient had a PCN reaction causing immediate rash, facial/tongue/throat swelling, SOB or lightheadedness with hypotension: No Has patient had a PCN reaction causing severe rash involving mucus membranes or skin necrosis: No Has patient had a PCN reaction that required hospitalization: No Has patient had a PCN reaction occurring within the last 10 years: No If all of the above answers are "NO", then may proceed with Cephalosporin use.    Tape Other (See Comments)    PAPER TAPE OK; RED AND RAW SKIN & SKIN TEARS   Latex Rash    Some gloves cause red and raw skin. Skin peels off.     Family History: Family History  Problem Relation Age of Onset   Arthritis Mother    Depression Father    Arthritis Father    Anxiety disorder Father    Hypertension Father    Hyperlipidemia Father    Diabetes Father    Crohn's disease Daughter    Heart disease Paternal Grandmother    Arthritis Paternal Grandmother    Diabetes Paternal Grandmother    Hyperlipidemia Paternal Grandmother    Hypertension Paternal Grandmother    Heart attack Paternal Grandmother    Hypertension Paternal Grandfather    Arthritis Paternal Grandfather    Colon cancer Neg Hx    Breast cancer Neg Hx     Social History:  reports that she has never smoked. She has never used smokeless tobacco. She reports that she does not drink  alcohol and does not use drugs.  ROS: All other review of systems were reviewed and are negative except what is noted above in HPI  Physical Exam: BP 114/79   Pulse 98   Constitutional:  Alert and oriented, No acute distress. HEENT: [place] AT, moist mucus membranes.  Trachea midline, no masses. Cardiovascular: No clubbing, cyanosis, or edema. Respiratory: Normal respiratory effort, no increased work of breathing. GI: Abdomen is soft, nontender, nondistended, no abdominal masses GU: No CVA tenderness.  Lymph: No cervical or inguinal lymphadenopathy. Skin: No rashes, bruises or suspicious lesions. Neurologic: Grossly intact, no focal deficits, moving all 4 extremities. Psychiatric: Normal mood and affect.  Laboratory Data: Lab Results  Component Value Date   WBC 11.4 (H) 11/27/2022   HGB 14.5 11/27/2022   HCT 42.5 11/27/2022   MCV 89.9 11/27/2022   PLT 341 11/27/2022    Lab Results  Component Value Date   CREATININE 0.92 11/27/2022    No results found for: "PSA"  No results found for: "TESTOSTERONE"  Lab Results  Component Value Date   HGBA1C 5.4 08/28/2022    Urinalysis    Component Value Date/Time   COLORURINE YELLOW 11/27/2022 0220   APPEARANCEUR Cloudy (A) 04/28/2023 0928   LABSPEC 1.020 11/27/2022 0220   PHURINE 5.0 11/27/2022 0220   GLUCOSEU Negative 04/28/2023 0928   HGBUR LARGE (A) 11/27/2022 0220   HGBUR 3+ 12/07/2010 1558   BILIRUBINUR Negative 04/28/2023 0928   KETONESUR NEGATIVE 11/27/2022 0220   PROTEINUR 2+ (A) 04/28/2023 0928   PROTEINUR 100 (A) 11/27/2022 0220   UROBILINOGEN 0.2 04/16/2014 1150   NITRITE Negative 04/28/2023 0928   NITRITE NEGATIVE 11/27/2022 0220   LEUKOCYTESUR 1+ (A) 04/28/2023 0928   LEUKOCYTESUR NEGATIVE 11/27/2022 0220    Lab Results  Component Value Date   LABMICR See below: 04/28/2023   WBCUA 6-10 (A) 04/28/2023   LABEPIT 0-10 04/28/2023   BACTERIA Few (A) 04/28/2023    Pertinent Imaging:  Results for orders  placed during the hospital encounter of 04/23/23  KUB day of procedure  Narrative CLINICAL DATA:  Nephrolithiasis.  EXAM: ABDOMEN - 1 VIEW  COMPARISON:  Renal ultrasound 03/27/2023  FINDINGS: Intrarenal calculi on prior CT are not well delineated by radiograph. No bowel dilatation to suggest obstruction. Small volume of colonic stool. Calcifications in the pelvis correspond to phleboliths on prior CT. Enteric sutures in the upper abdomen.  IMPRESSION: Intrarenal calculi on prior imaging are not well delineated by radiograph.   Electronically Signed By: Narda Rutherford M.D. On: 04/23/2023 15:39  No results found for this or any previous visit.  No results found for this or any previous visit.  No results found for this or any previous visit.  Results for orders placed during the hospital encounter of 03/27/23  Ultrasound renal complete  Narrative : PROCEDURE: US RENAL  HISTORY: Patient is a 48 y/o  F with nephrolithiasis.  COMPARISON: U/S renal 12/26/2022, CT renal 11/27/2022.  TECHNIQUE: Two-dimensional grayscale and color Doppler ultrasound of the kidneys was performed.  FINDINGS: The urinary bladder demonstrates normal anechoic echogenicity. The bilateral ureteral jets are visualized.  The right kidney measures 10.9 x 7.2 x 6.3 cm. Renal cortical echotexture is within normal limits. There is no hydronephrosis. There are multiple small non-obstructing stones identified throughout the right kidney. There are no cysts.  The left kidney measures 10.4 x 5.4 x 5.0 cm. Renal cortical echotexture is within normal limits. There is no hydronephrosis. There are multiple small non-obstructing stones identified throughout the left kidney. There are no cysts.  IMPRESSION: 1. Multiple bilateral renal calculi. No significant change from prior imaging dating back to November 2024.  Thank you for allowing [place] to assist in the care of this patient.   Electronically  Signed By: Lestine Box M.D. On: 03/27/2023 17:24  No results found for this or any previous visit.  No results found for this or any previous visit.  Results for orders placed during the hospital encounter of 11/27/22  CT RENAL STONE STUDY  Narrative CLINICAL DATA:  Abdominal/flank pain, stone suspected RLQ abdominal pain. Pain started intermittently Tuesday night but pt states that pain awoke her from sleep and now is intense  and constant. Pt 10/10. Endorses nausea.  EXAM: CT ABDOMEN AND PELVIS WITHOUT CONTRAST  TECHNIQUE: Multidetector CT imaging of the abdomen and pelvis was performed following the standard protocol without IV contrast.  RADIATION DOSE REDUCTION: This exam was performed according to the departmental dose-optimization program which includes automated exposure control, adjustment of the mA and/or kV according to patient size and/or use of iterative reconstruction technique.  COMPARISON:  CT abdomen pelvis 04/16/2014  FINDINGS: Lower chest: Tiny hiatal hernia.  No acute abnormality.  Hepatobiliary: No focal liver abnormality. No gallstones, gallbladder wall thickening, or pericholecystic fluid. No biliary dilatation.  Pancreas: No focal lesion. Normal pancreatic contour. No surrounding inflammatory changes. No main pancreatic ductal dilatation.  Spleen: Normal in size without focal abnormality.  Adrenals/Urinary Tract:  No adrenal nodule bilaterally.  4 mm right and punctate left nephrolithiasis. No ureterolithiasis bilaterally. Mild hydronephrosis of the right kidney. No left nephroureterolithiasis.  The urinary bladder is unremarkable.  Stomach/Bowel: Gastric sleeve surgical changes noted. Stomach is within normal limits. No evidence of bowel wall thickening or dilatation. Colonic diverticulosis. Appendix appears normal.  Vascular/Lymphatic: No abdominal aorta or iliac aneurysm. No abdominal, pelvic, or inguinal  lymphadenopathy.  Reproductive: Uterus and bilateral adnexa are unremarkable.  Other: No intraperitoneal free fluid. No intraperitoneal free gas. No organized fluid collection.  Musculoskeletal:  No abdominal wall hernia or abnormality.  No suspicious lytic or blastic osseous lesions. No acute displaced fracture.  IMPRESSION: 1. Indeterminate etiology of mild right hydronephrosis. This may reflect the residual of a recently passed calculus or reflect chronic changes of obstructive uropathy or reflux. 2. Bilateral nonobstructive nephrolithiasis measuring up to 4 mm on the right and punctate on the left. 3. Colonic diverticulosis with no acute diverticulitis. 4. Tiny hiatal hernia in the setting of gastric sleeve surgical changes.   Electronically Signed By: Tish Frederickson M.D. On: 11/27/2022 02:26   Assessment & Plan:    1. Kidney stones (Primary) Followup 6 weeks with renal US - Urinalysis, Routine w reflex microscopic   No follow-ups on file.  [name], MD  [facility]

## 2023-05-07 LAB — URINALYSIS, ROUTINE W REFLEX MICROSCOPIC
Bilirubin, UA: NEGATIVE
Glucose, UA: NEGATIVE
Ketones, UA: NEGATIVE
Leukocytes,UA: NEGATIVE
Nitrite, UA: NEGATIVE
Protein,UA: NEGATIVE
RBC, UA: NEGATIVE
Specific Gravity, UA: 1.01 (ref 1.005–1.030)
Urobilinogen, Ur: 0.2 mg/dL (ref 0.2–1.0)
pH, UA: 6.5 (ref 5.0–7.5)

## 2023-05-12 ENCOUNTER — Encounter: Payer: Self-pay | Admitting: Urology

## 2023-05-12 NOTE — Patient Instructions (Signed)

## 2023-05-22 ENCOUNTER — Ambulatory Visit: Payer: No Typology Code available for payment source | Admitting: Urology

## 2023-06-01 ENCOUNTER — Ambulatory Visit (HOSPITAL_COMMUNITY)

## 2023-06-17 ENCOUNTER — Ambulatory Visit: Admitting: Urology

## 2023-07-30 ENCOUNTER — Encounter (HOSPITAL_COMMUNITY): Payer: Self-pay | Admitting: *Deleted

## 2023-09-03 ENCOUNTER — Telehealth: Payer: Self-pay

## 2023-09-03 NOTE — Telephone Encounter (Signed)
 Copied from CRM [id number]. Topic: Clinical - Request for Lab/Test Order >> Sep 03, 2023 10:40 AM Tobias CROME wrote: Reason for CRM: Patient needing biometrics screening by a 10/09/23. Patient went to [facility] and unable to get labwork done w/out an order. Patient requesting Tiffany to place order for labwork, A1C, Cholesterol, etc.   Please reach out to patient once orders are placed, [phone]

## 2023-09-03 NOTE — Telephone Encounter (Signed)
 Pt scheduled for Physical 09/14/23 for fasting labs.

## 2023-09-14 ENCOUNTER — Ambulatory Visit: Admitting: Family Medicine

## 2023-09-14 ENCOUNTER — Encounter: Payer: Self-pay | Admitting: Family Medicine

## 2023-09-14 VITALS — BP 128/81 | HR 76 | Temp 98.3°F | Ht 66.0 in | Wt 243.2 lb

## 2023-09-14 DIAGNOSIS — F339 Major depressive disorder, recurrent, unspecified: Secondary | ICD-10-CM

## 2023-09-14 DIAGNOSIS — G43909 Migraine, unspecified, not intractable, without status migrainosus: Secondary | ICD-10-CM

## 2023-09-14 DIAGNOSIS — Z0001 Encounter for general adult medical examination with abnormal findings: Secondary | ICD-10-CM

## 2023-09-14 DIAGNOSIS — Z0000 Encounter for general adult medical examination without abnormal findings: Secondary | ICD-10-CM

## 2023-09-14 DIAGNOSIS — F411 Generalized anxiety disorder: Secondary | ICD-10-CM

## 2023-09-14 DIAGNOSIS — E782 Mixed hyperlipidemia: Secondary | ICD-10-CM | POA: Diagnosis not present

## 2023-09-14 LAB — LIPID PANEL

## 2023-09-14 LAB — BAYER DCA HB A1C WAIVED: HB A1C (BAYER DCA - WAIVED): 5.1 % (ref 4.8–5.6)

## 2023-09-14 MED ORDER — ONDANSETRON HCL 4 MG PO TABS: 4.0000 mg | ORAL_TABLET | Freq: Three times a day (TID) | ORAL | 1 refills | Status: AC | PRN

## 2023-09-14 MED ORDER — ESCITALOPRAM OXALATE 10 MG PO TABS
10.0000 mg | ORAL_TABLET | Freq: Every day | ORAL | 3 refills | Status: AC
Start: 2023-09-14 — End: ?

## 2023-09-14 MED ORDER — ELETRIPTAN HYDROBROMIDE 40 MG PO TABS
40.0000 mg | ORAL_TABLET | ORAL | 11 refills | Status: AC | PRN
Start: 2023-09-14 — End: ?

## 2023-09-14 NOTE — Progress Notes (Signed)
 Complete physical exam  Patient: Barbara [name]   DOB: Aug 14, 1975   48 y.o. Female  MRN: [id number]  Subjective:    Chief Complaint  Patient presents with   Annual Exam    Barbara [name] is a 48 y.o. female who presents today for a complete physical exam. She reports consuming a general diet. The patient does not participate in regular exercise at present. She generally feels fairly well. She reports sleeping well. She does have additional problems to discuss today.   Anxiety and depression has not been well controlled. She has been out of her lexapro  since November. Her father passed unexpectedly in November. She has also cut ties from her mother and siblings since his passing. This has caused some grief for her. She would like to restart on lexapro . She has been taking hydroxyzine  prn.   Most recent fall risk assessment:    01/13/2023    9:41 AM  Fall Risk   Falls in the past year? 0  Number falls in past yr: 0  Injury with Fall? 0  Risk for fall due to : No Fall Risks  Follow up Falls evaluation completed;Education provided     Most recent depression screenings:    09/14/2023   11:37 AM 01/13/2023    9:40 AM 12/03/2022    8:20 AM  Depression screen PHQ 2/9  Decreased Interest 1 1 1   Down, Depressed, Hopeless 1 1 0  PHQ - 2 Score 2 2 1   Altered sleeping 0 1 0  Tired, decreased energy 1 1 0  Change in appetite 2 1 0  Feeling bad or failure about yourself  0 1 0  Trouble concentrating 0 1 0  Moving slowly or fidgety/restless 0 0 0  Suicidal thoughts 0 0 0  PHQ-9 Score 5 7 1   Difficult doing work/chores Not difficult at all  Not difficult at all      09/14/2023   11:37 AM 01/13/2023    9:40 AM 12/03/2022    8:19 AM 10/06/2022    1:28 PM  GAD 7 : Generalized Anxiety Score  Nervous, Anxious, on Edge 0 1 0 1  Control/stop worrying 0 1 1 0  Worry too much - different things 0 1 0 0  Trouble relaxing 0 1 0 0  Restless 0 1 0 0  Easily annoyed or irritable 2 0 0 0   Afraid - awful might happen 0 1 1 0  Total GAD 7 Score 2 6 2 1   Anxiety Difficulty Not difficult at all Not difficult at all Not difficult at all Not difficult at all          Patient Care Team: Joesph Annabella HERO, FNP as PCP - General (Family Medicine) Harvey Margo CROME, MD (Inactive) as Consulting Physician (Gastroenterology) Jayne Vonn DEL, MD as Consulting Physician (Obstetrics and Gynecology)   Outpatient Medications Prior to Visit  Medication Sig   Calcium-Phosphorus-Vitamin D  (CALCIUM/D3 ADULT GUMMIES PO) Take 1 tablet by mouth 3 (three) times daily.   eletriptan  (RELPAX ) 40 MG tablet Take 1 tablet (40 mg total) by mouth as needed for migraine or headache. May repeat in 2 hours if headache persists or recurs.   escitalopram  (LEXAPRO ) 10 MG tablet Take 1 tablet (10 mg total) by mouth daily.   hydrOXYzine  (VISTARIL ) 25 MG capsule TAKE 1-2 CAPSULES (25-50 MG TOTAL) BY MOUTH EVERY 8 (EIGHT) HOURS AS NEEDED FOR ANXIETY.   levocetirizine (XYZAL) 5 MG tablet Take 5 mg by mouth every  evening.   Multiple Vitamins-Minerals (BARIATRIC FUSION) CHEW Chew 1 tablet by mouth in the morning. NO IRON   ondansetron  (ZOFRAN ) 4 MG tablet Take 1 tablet (4 mg total) by mouth every 8 (eight) hours as needed for nausea or vomiting.   sodium bicarbonate  650 MG tablet Take 1 tablet (650 mg total) by mouth 2 (two) times daily.   [DISCONTINUED] cyclobenzaprine  (FLEXERIL ) 10 MG tablet Take 1 tablet (10 mg total) by mouth 3 (three) times daily as needed for muscle spasms.   [DISCONTINUED] oxyCODONE  (OXY IR/ROXICODONE ) 5 MG immediate release tablet Take 1 tablet (5 mg total) by mouth every 4 (four) hours as needed for severe pain (pain score 7-10).   No facility-administered medications prior to visit.    ROS Negative unless specially indicated above in HPI.     Objective:     BP 128/81   Pulse 76   Temp 98.3 F (36.8 C) (Temporal)   Ht 5' 6 (1.676 m)   Wt 243 lb 3.2 oz (110.3 kg)   SpO2 96%    BMI 39.25 kg/m  Wt Readings from Last 3 Encounters:  09/14/23 243 lb 3.2 oz (110.3 kg)  04/23/23 240 lb 12.8 oz (109.2 kg)  04/21/23 233 lb 0.4 oz (105.7 kg)      Physical Exam Vitals and nursing note reviewed.  Constitutional:      General: She is not in acute distress.    Appearance: She is obese. She is not ill-appearing, toxic-appearing or diaphoretic.  HENT:     Head: Normocephalic.     Right Ear: Tympanic membrane, ear canal and external ear normal.     Left Ear: Tympanic membrane, ear canal and external ear normal.     Nose: Nose normal.     Mouth/Throat:     Mouth: Mucous membranes are moist.     Pharynx: Oropharynx is clear.  Eyes:     Extraocular Movements: Extraocular movements intact.     Conjunctiva/sclera: Conjunctivae normal.     Pupils: Pupils are equal, round, and reactive to light.  Neck:     Thyroid : No thyroid  mass, thyromegaly or thyroid  tenderness.  Cardiovascular:     Rate and Rhythm: Normal rate and regular rhythm.     Pulses: Normal pulses.     Heart sounds: Normal heart sounds. No murmur heard.    No friction rub. No gallop.  Pulmonary:     Effort: Pulmonary effort is normal.     Breath sounds: Normal breath sounds.  Abdominal:     General: Bowel sounds are normal. There is no distension.     Palpations: Abdomen is soft. There is no mass.     Tenderness: There is no abdominal tenderness. There is no guarding.  Musculoskeletal:        General: No tenderness.     Cervical back: Normal range of motion and neck supple. No tenderness.     Right lower leg: No edema.     Left lower leg: No edema.  Skin:    General: Skin is warm and dry.     Capillary Refill: Capillary refill takes less than 2 seconds.     Findings: No lesion or rash.  Neurological:     General: No focal deficit present.     Mental Status: She is alert and oriented to person, place, and time.     Cranial Nerves: No cranial nerve deficit.     Motor: No weakness.     Gait: Gait  normal.  Psychiatric:        Attention and Perception: Attention normal.        Mood and Affect: Affect is tearful.        Speech: Speech normal.        Behavior: Behavior normal.        Thought Content: Thought content normal.        Judgment: Judgment normal.      No results found for any visits on 09/14/23.     Assessment & Plan:    Routine Health Maintenance and Physical Exam  Barbara [name] was seen today for annual exam.  Diagnoses and all orders for this visit:  Routine general medical examination at a health care facility  Mixed hyperlipidemia Fasting panel pending.  -     Lipid panel  Morbid obesity (HCC) Diet, exercise, weight loss.  -     CBC with Differential/Platelet -     CMP14+EGFR -     Lipid panel -     TSH -     Bayer DCA Hb A1c Waived  Depression, recurrent (HCC) GAD (generalized anxiety disorder) Uncontrolled. Denies SI. Restart lexapro . Follow up in 4-6 weeks if no improvement or for worsening of symptoms.  -     escitalopram  (LEXAPRO ) 10 MG tablet; Take 1 tablet (10 mg total) by mouth daily.  Migraine without status migrainosus, not intractable, unspecified migraine type Well controlled on current regimen.  -     eletriptan  (RELPAX ) 40 MG tablet; Take 1 tablet (40 mg total) by mouth as needed for migraine or headache. May repeat in 2 hours if headache persists or recurs. -     ondansetron  (ZOFRAN ) 4 MG tablet; Take 1 tablet (4 mg total) by mouth every 8 (eight) hours as needed for nausea or vomiting.    Immunization History  Administered Date(s) Administered   Influenza,inj,Quad PF,6+ Mos 01/16/2018, 11/26/2018   Influenza-Unspecified 01/16/2018   Tdap 11/26/2018    Health Maintenance  Topic Date Due   Hepatitis C Screening  10/06/2023 (Originally 12/17/1991)   HIV Screening  10/06/2023 (Originally 12/16/1988)   COVID-19 Vaccine (1 - 2024-25 season) 12/19/2023 (Originally 10/19/2022)   Hepatitis B Vaccines (1 of 3 - 19+ 3-dose series) 09/13/2024  (Originally 12/16/1992)   INFLUENZA VACCINE  09/18/2023   MAMMOGRAM  12/11/2024   Cervical Cancer Screening (HPV/Pap Cotest)  12/03/2027   DTaP/Tdap/Td (2 - Td or Tdap) 11/25/2028   Colonoscopy  11/28/2030   HPV VACCINES  Aged Out   Meningococcal B Vaccine  Aged Out    Discussed health benefits of physical activity, and encouraged her to engage in regular exercise appropriate for her age and condition.  Problem List Items Addressed This Visit       Cardiovascular and Mediastinum   Migraine headache   Relevant Medications   escitalopram  (LEXAPRO ) 10 MG tablet   eletriptan  (RELPAX ) 40 MG tablet   ondansetron  (ZOFRAN ) 4 MG tablet     Other   Morbid obesity (HCC)   Relevant Orders   TSH   Bayer DCA Hb A1c Waived   GAD (generalized anxiety disorder)   Relevant Medications   escitalopram  (LEXAPRO ) 10 MG tablet   Depression, recurrent (HCC)   Relevant Medications   escitalopram  (LEXAPRO ) 10 MG tablet   Mixed hyperlipidemia   Relevant Orders   Lipid panel   Other Visit Diagnoses       Routine general medical examination at a health care facility    -  Primary   Relevant Orders  CBC with Differential/Platelet   CMP14+EGFR   Lipid panel   Bayer DCA Hb A1c Waived      Return in about 1 year (around 09/13/2024) for medication follow up, CPE.   The patient indicates understanding of these issues and agrees with the plan.   Annabella CHRISTELLA Search, FNP

## 2023-09-15 LAB — TSH: TSH: 1.71 u[IU]/mL (ref 0.450–4.500)

## 2023-09-15 LAB — CMP14+EGFR
ALT: 15 IU/L (ref 0–32)
AST: 23 IU/L (ref 0–40)
Albumin: 4.4 g/dL (ref 3.9–4.9)
Alkaline Phosphatase: 71 IU/L (ref 44–121)
BUN/Creatinine Ratio: 13 (ref 9–23)
BUN: 11 mg/dL (ref 6–24)
Bilirubin Total: 0.4 mg/dL (ref 0.0–1.2)
CO2: 22 mmol/L (ref 20–29)
Calcium: 10 mg/dL (ref 8.7–10.2)
Chloride: 102 mmol/L (ref 96–106)
Creatinine, Ser: 0.88 mg/dL (ref 0.57–1.00)
Globulin, Total: 2.5 g/dL (ref 1.5–4.5)
Glucose: 85 mg/dL (ref 70–99)
Potassium: 4.8 mmol/L (ref 3.5–5.2)
Sodium: 139 mmol/L (ref 134–144)
Total Protein: 6.9 g/dL (ref 6.0–8.5)
eGFR: 81 mL/min/1.73 (ref >59)

## 2023-09-15 LAB — CBC WITH DIFFERENTIAL/PLATELET
Basophils Absolute: 0.1 x10E3/uL (ref 0.0–0.2)
Basos: 1 %
EOS (ABSOLUTE): 0.1 x10E3/uL (ref 0.0–0.4)
Eos: 1 %
Hematocrit: 45.8 % (ref 34.0–46.6)
Hemoglobin: 15 g/dL (ref 11.1–15.9)
Immature Grans (Abs): 0 x10E3/uL (ref 0.0–0.1)
Immature Granulocytes: 0 %
Lymphocytes Absolute: 2.9 x10E3/uL (ref 0.7–3.1)
Lymphs: 38 %
MCH: 29.9 pg (ref 26.6–33.0)
MCHC: 32.8 g/dL (ref 31.5–35.7)
MCV: 91 fL (ref 79–97)
Monocytes Absolute: 0.5 x10E3/uL (ref 0.1–0.9)
Monocytes: 7 %
Neutrophils Absolute: 4 x10E3/uL (ref 1.4–7.0)
Neutrophils: 53 %
Platelets: 409 x10E3/uL (ref 150–450)
RBC: 5.01 x10E6/uL (ref 3.77–5.28)
RDW: 12.4 % (ref 11.7–15.4)
WBC: 7.5 x10E3/uL (ref 3.4–10.8)

## 2023-09-15 LAB — LIPID PANEL
Cholesterol, Total: 212 mg/dL — AB (ref 100–199)
HDL: 65 mg/dL (ref >39)
LDL CALC COMMENT:: 3.3 ratio (ref 0.0–4.4)
LDL Chol Calc (NIH): 130 mg/dL — AB (ref 0–99)
Triglycerides: 95 mg/dL (ref 0–149)
VLDL Cholesterol Cal: 17 mg/dL (ref 5–40)

## 2023-09-16 ENCOUNTER — Ambulatory Visit: Payer: Self-pay | Admitting: Family Medicine

## 2023-11-06 ENCOUNTER — Other Ambulatory Visit: Payer: Self-pay | Admitting: Family Medicine

## 2023-11-06 DIAGNOSIS — F418 Other specified anxiety disorders: Secondary | ICD-10-CM

## 2023-12-04 ENCOUNTER — Encounter: Payer: No Typology Code available for payment source | Admitting: Family Medicine

## 2023-12-17 ENCOUNTER — Other Ambulatory Visit: Payer: Self-pay | Admitting: Family Medicine

## 2023-12-17 DIAGNOSIS — Z1231 Encounter for screening mammogram for malignant neoplasm of breast: Secondary | ICD-10-CM

## 2023-12-28 ENCOUNTER — Ambulatory Visit
Admission: RE | Admit: 2023-12-28 | Discharge: 2023-12-28 | Disposition: A | Source: Ambulatory Visit | Attending: Family Medicine

## 2023-12-28 ENCOUNTER — Encounter

## 2023-12-28 DIAGNOSIS — Z1231 Encounter for screening mammogram for malignant neoplasm of breast: Secondary | ICD-10-CM
